# Patient Record
Sex: Male | Born: 1950
Health system: Southern US, Community
[De-identification: ages and names within clinical notes are randomized; demographics above are authoritative.]

## PROBLEM LIST (undated history)

## (undated) DIAGNOSIS — C801 Malignant (primary) neoplasm, unspecified: Secondary | ICD-10-CM

## (undated) DIAGNOSIS — J310 Chronic rhinitis: Secondary | ICD-10-CM

## (undated) DIAGNOSIS — H269 Unspecified cataract: Secondary | ICD-10-CM

## (undated) DIAGNOSIS — F1010 Alcohol abuse, uncomplicated: Secondary | ICD-10-CM

## (undated) DIAGNOSIS — Z860100 Personal history of colon polyps, unspecified: Secondary | ICD-10-CM

## (undated) DIAGNOSIS — E785 Hyperlipidemia, unspecified: Secondary | ICD-10-CM

## (undated) DIAGNOSIS — N189 Chronic kidney disease, unspecified: Secondary | ICD-10-CM

## (undated) DIAGNOSIS — Z87442 Personal history of urinary calculi: Secondary | ICD-10-CM

## (undated) DIAGNOSIS — K219 Gastro-esophageal reflux disease without esophagitis: Secondary | ICD-10-CM

## (undated) DIAGNOSIS — E119 Type 2 diabetes mellitus without complications: Secondary | ICD-10-CM

## (undated) DIAGNOSIS — T7840XA Allergy, unspecified, initial encounter: Secondary | ICD-10-CM

## (undated) HISTORY — DX: Chronic kidney disease, unspecified: N18.9

## (undated) HISTORY — DX: Alcohol abuse, uncomplicated: F10.10

## (undated) HISTORY — DX: Personal history of colon polyps, unspecified: Z86.0100

## (undated) HISTORY — DX: Allergy, unspecified, initial encounter: T78.40XA

## (undated) HISTORY — PX: CATARACT EXTRACTION: SUR2

## (undated) HISTORY — DX: Chronic rhinitis: J31.0

## (undated) HISTORY — DX: Personal history of colonic polyps: Z86.010

## (undated) HISTORY — DX: Hyperlipidemia, unspecified: E78.5

## (undated) HISTORY — DX: Unspecified cataract: H26.9

---
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
## 1976-12-18 HISTORY — PX: OTHER SURGICAL HISTORY: SHX169

## 2002-12-18 HISTORY — PX: WISDOM TOOTH EXTRACTION: SHX21

## 2004-10-31 ENCOUNTER — Ambulatory Visit: Payer: Self-pay | Admitting: Internal Medicine

## 2004-11-02 ENCOUNTER — Ambulatory Visit: Payer: Self-pay | Admitting: Internal Medicine

## 2004-11-07 ENCOUNTER — Ambulatory Visit: Payer: Self-pay | Admitting: Internal Medicine

## 2005-06-09 ENCOUNTER — Ambulatory Visit: Payer: Self-pay | Admitting: Internal Medicine

## 2005-07-28 ENCOUNTER — Ambulatory Visit: Payer: Self-pay | Admitting: Internal Medicine

## 2005-08-22 ENCOUNTER — Ambulatory Visit: Payer: Self-pay | Admitting: Internal Medicine

## 2005-08-22 ENCOUNTER — Encounter (INDEPENDENT_AMBULATORY_CARE_PROVIDER_SITE_OTHER): Payer: Self-pay | Admitting: Specialist

## 2005-11-15 ENCOUNTER — Ambulatory Visit: Payer: Self-pay | Admitting: Internal Medicine

## 2005-11-22 ENCOUNTER — Ambulatory Visit: Payer: Self-pay | Admitting: Internal Medicine

## 2005-11-29 ENCOUNTER — Ambulatory Visit: Payer: Self-pay | Admitting: Internal Medicine

## 2006-01-12 ENCOUNTER — Ambulatory Visit: Payer: Self-pay | Admitting: Internal Medicine

## 2006-03-01 ENCOUNTER — Ambulatory Visit: Payer: Self-pay | Admitting: Internal Medicine

## 2006-10-19 ENCOUNTER — Ambulatory Visit: Payer: Self-pay | Admitting: Internal Medicine

## 2006-12-21 ENCOUNTER — Ambulatory Visit: Payer: Self-pay | Admitting: Internal Medicine

## 2006-12-21 LAB — CONVERTED CEMR LAB
Albumin: 3.8 g/dL (ref 3.5–5.2)
Alkaline Phosphatase: 48 units/L (ref 39–117)
BUN: 18 mg/dL (ref 6–23)
Basophils Absolute: 0 10*3/uL (ref 0.0–0.1)
CO2: 29 meq/L (ref 19–32)
Calcium: 9 mg/dL (ref 8.4–10.5)
Chol/HDL Ratio, serum: 2.8
Creatinine, Ser: 0.9 mg/dL (ref 0.4–1.5)
Glomerular Filtration Rate, Af Am: 114 mL/min/{1.73_m2}
Glucose, Bld: 112 mg/dL — ABNORMAL HIGH (ref 70–99)
Hemoglobin, Urine: NEGATIVE
Hemoglobin: 14.5 g/dL (ref 13.0–17.0)
Ketones, ur: NEGATIVE mg/dL
LDL Cholesterol: 66 mg/dL (ref 0–99)
Lymphocytes Relative: 43.5 % (ref 12.0–46.0)
MCHC: 34.4 g/dL (ref 30.0–36.0)
MCV: 92.2 fL (ref 78.0–100.0)
Monocytes Absolute: 0.6 10*3/uL (ref 0.2–0.7)
Monocytes Relative: 9.9 % (ref 3.0–11.0)
Neutro Abs: 2.4 10*3/uL (ref 1.4–7.7)
Neutrophils Relative %: 38.7 % — ABNORMAL LOW (ref 43.0–77.0)
Nitrite: NEGATIVE
Platelets: 261 10*3/uL (ref 150–400)
TSH: 1.92 microintl units/mL (ref 0.35–5.50)
Total Protein: 6.3 g/dL (ref 6.0–8.3)
Urine Glucose: NEGATIVE mg/dL
Urobilinogen, UA: 0.2 (ref 0.0–1.0)
VLDL: 10 mg/dL (ref 0–40)
pH: 5.5 (ref 5.0–8.0)

## 2006-12-28 ENCOUNTER — Ambulatory Visit: Payer: Self-pay | Admitting: Internal Medicine

## 2007-09-16 ENCOUNTER — Encounter: Payer: Self-pay | Admitting: *Deleted

## 2007-09-16 DIAGNOSIS — J310 Chronic rhinitis: Secondary | ICD-10-CM | POA: Insufficient documentation

## 2007-09-16 DIAGNOSIS — E785 Hyperlipidemia, unspecified: Secondary | ICD-10-CM | POA: Insufficient documentation

## 2007-09-18 ENCOUNTER — Ambulatory Visit: Payer: Self-pay | Admitting: Internal Medicine

## 2008-02-18 ENCOUNTER — Telehealth: Payer: Self-pay | Admitting: Internal Medicine

## 2008-04-16 ENCOUNTER — Telehealth: Payer: Self-pay | Admitting: Internal Medicine

## 2008-04-16 ENCOUNTER — Ambulatory Visit: Payer: Self-pay | Admitting: Internal Medicine

## 2008-04-16 LAB — CONVERTED CEMR LAB
ALT: 45 units/L (ref 0–53)
AST: 34 units/L (ref 0–37)
Alkaline Phosphatase: 48 units/L (ref 39–117)
BUN: 14 mg/dL (ref 6–23)
Basophils Absolute: 0 10*3/uL (ref 0.0–0.1)
CO2: 31 meq/L (ref 19–32)
Chloride: 106 meq/L (ref 96–112)
Cholesterol: 159 mg/dL (ref 0–200)
Eosinophils Absolute: 0.3 10*3/uL (ref 0.0–0.7)
Eosinophils Relative: 4.5 % (ref 0.0–5.0)
GFR calc non Af Amer: 107 mL/min
Hemoglobin, Urine: NEGATIVE
LDL Cholesterol: 102 mg/dL — ABNORMAL HIGH (ref 0–99)
Leukocytes, UA: NEGATIVE
Lymphocytes Relative: 41.8 % (ref 12.0–46.0)
MCV: 92.1 fL (ref 78.0–100.0)
Neutrophils Relative %: 42.9 % — ABNORMAL LOW (ref 43.0–77.0)
Platelets: 223 10*3/uL (ref 150–400)
Potassium: 4.3 meq/L (ref 3.5–5.1)
RBC: 4.75 M/uL (ref 4.22–5.81)
Specific Gravity, Urine: <=1.005 (ref 1.000–1.03)
Total Bilirubin: 1 mg/dL (ref 0.3–1.2)
Total CHOL/HDL Ratio: 3.6
Urine Glucose: NEGATIVE mg/dL
Urobilinogen, UA: 0.2 (ref 0.0–1.0)
VLDL: 13 mg/dL (ref 0–40)
WBC: 6.7 10*3/uL (ref 4.5–10.5)

## 2008-04-28 ENCOUNTER — Ambulatory Visit: Payer: Self-pay | Admitting: Internal Medicine

## 2008-04-28 DIAGNOSIS — F1021 Alcohol dependence, in remission: Secondary | ICD-10-CM | POA: Insufficient documentation

## 2008-05-15 ENCOUNTER — Ambulatory Visit: Payer: Self-pay | Admitting: Sports Medicine

## 2008-05-15 DIAGNOSIS — M722 Plantar fascial fibromatosis: Secondary | ICD-10-CM | POA: Insufficient documentation

## 2008-05-15 DIAGNOSIS — Q667 Congenital pes cavus, unspecified foot: Secondary | ICD-10-CM | POA: Insufficient documentation

## 2008-05-15 DIAGNOSIS — M629 Disorder of muscle, unspecified: Secondary | ICD-10-CM | POA: Insufficient documentation

## 2008-05-17 DIAGNOSIS — Z860100 Personal history of colon polyps, unspecified: Secondary | ICD-10-CM | POA: Insufficient documentation

## 2008-09-01 ENCOUNTER — Telehealth: Payer: Self-pay | Admitting: Internal Medicine

## 2008-10-09 ENCOUNTER — Ambulatory Visit: Payer: Self-pay | Admitting: Internal Medicine

## 2008-11-02 ENCOUNTER — Ambulatory Visit: Payer: Self-pay | Admitting: Internal Medicine

## 2008-11-16 ENCOUNTER — Encounter: Payer: Self-pay | Admitting: Internal Medicine

## 2008-11-16 ENCOUNTER — Ambulatory Visit: Payer: Self-pay | Admitting: Internal Medicine

## 2008-11-16 LAB — HM COLONOSCOPY

## 2008-11-17 ENCOUNTER — Encounter: Payer: Self-pay | Admitting: Internal Medicine

## 2008-11-23 ENCOUNTER — Telehealth: Payer: Self-pay | Admitting: Internal Medicine

## 2009-06-03 ENCOUNTER — Ambulatory Visit: Payer: Self-pay | Admitting: Internal Medicine

## 2009-06-03 LAB — CONVERTED CEMR LAB
Alkaline Phosphatase: 61 units/L (ref 39–117)
Basophils Relative: 0 % (ref 0.0–3.0)
Bilirubin, Direct: 0.2 mg/dL (ref 0.0–0.3)
Calcium: 8.9 mg/dL (ref 8.4–10.5)
Creatinine, Ser: 0.8 mg/dL (ref 0.4–1.5)
Eosinophils Absolute: 0.4 10*3/uL (ref 0.0–0.7)
Eosinophils Relative: 5.3 % — ABNORMAL HIGH (ref 0.0–5.0)
GFR calc non Af Amer: 106.28 mL/min (ref >60)
HDL: 49.6 mg/dL (ref >39.00)
Hemoglobin, Urine: NEGATIVE
LDL Cholesterol: 82 mg/dL (ref 0–99)
Lymphocytes Relative: 36.9 % (ref 12.0–46.0)
MCHC: 34.9 g/dL (ref 30.0–36.0)
Monocytes Relative: 10.1 % (ref 3.0–12.0)
Neutrophils Relative %: 47.7 % (ref 43.0–77.0)
PSA: 0.62 ng/mL (ref 0.10–4.00)
RBC: 4.48 M/uL (ref 4.22–5.81)
Total CHOL/HDL Ratio: 3
Total Protein, Urine: NEGATIVE mg/dL
Total Protein: 6.4 g/dL (ref 6.0–8.3)
Triglycerides: 86 mg/dL (ref 0.0–149.0)
Urine Glucose: NEGATIVE mg/dL
WBC: 7 10*3/uL (ref 4.5–10.5)

## 2009-06-08 ENCOUNTER — Ambulatory Visit: Payer: Self-pay | Admitting: Internal Medicine

## 2009-10-12 ENCOUNTER — Ambulatory Visit: Payer: Self-pay | Admitting: Internal Medicine

## 2010-06-21 ENCOUNTER — Ambulatory Visit: Payer: Self-pay | Admitting: Internal Medicine

## 2010-06-22 LAB — CONVERTED CEMR LAB
Albumin: 4.1 g/dL (ref 3.5–5.2)
CO2: 28 meq/L (ref 19–32)
Chloride: 109 meq/L (ref 96–112)
Creatinine, Ser: 0.8 mg/dL (ref 0.4–1.5)
Eosinophils Relative: 7.9 % — ABNORMAL HIGH (ref 0.0–5.0)
HCT: 41.5 % (ref 39.0–52.0)
HDL: 50.9 mg/dL (ref >39.00)
Hemoglobin: 14.5 g/dL (ref 13.0–17.0)
LDL Cholesterol: 80 mg/dL (ref 0–99)
Lymphs Abs: 3.2 10*3/uL (ref 0.7–4.0)
Monocytes Relative: 9.4 % (ref 3.0–12.0)
Neutro Abs: 2.9 10*3/uL (ref 1.4–7.7)
Nitrite: NEGATIVE
RBC: 4.5 M/uL (ref 4.22–5.81)
Sodium: 141 meq/L (ref 135–145)
Specific Gravity, Urine: 1.02 (ref 1.000–1.030)
TSH: 2.39 microintl units/mL (ref 0.35–5.50)
Total CHOL/HDL Ratio: 3
Total Protein, Urine: NEGATIVE mg/dL
Triglycerides: 105 mg/dL (ref 0.0–149.0)
Urine Glucose: NEGATIVE mg/dL
Urobilinogen, UA: 0.2 (ref 0.0–1.0)
VLDL: 21 mg/dL (ref 0.0–40.0)
WBC: 7.4 10*3/uL (ref 4.5–10.5)

## 2010-06-29 ENCOUNTER — Ambulatory Visit: Payer: Self-pay | Admitting: Internal Medicine

## 2010-10-10 ENCOUNTER — Ambulatory Visit: Payer: Self-pay | Admitting: Internal Medicine

## 2011-01-19 NOTE — Assessment & Plan Note (Signed)
Summary: FLU SHOT/NWS   Nurse Visit   Allergies: 1)  ! Sudafed  Orders Added: 1)  Admin 1st Vaccine [90471] 2)  Flu Vaccine 25yrs + [16109]      Flu Vaccine Consent Questions     Do you have a history of severe allergic reactions to this vaccine? no    Any prior history of allergic reactions to egg and/or gelatin? no    Do you have a sensitivity to the preservative Thimersol? no    Do you have a past history of Guillan-Barre Syndrome? no    Do you currently have an acute febrile illness? no    Have you ever had a severe reaction to latex? no    Vaccine information given and explained to patient? yes    Are you currently pregnant? no    Lot Number:[id number]   Exp Date:06/17/2011   Site Given  Left Deltoid IM1

## 2011-01-19 NOTE — Assessment & Plan Note (Signed)
Summary: CPX/ NWS   Vital Signs:  Patient profile:   60 year old male Height:      69 inches Weight:      206 pounds BMI:     30.53 O2 Sat:      98 % on Room air Temp:     96.8 degrees F oral Pulse rate:   74 / minute BP sitting:   132 / 88  (left arm) Cuff size:   regular  Vitals Entered By: Bill Salinas CMA (June 29, 2010 10:54 AM)  O2 Flow:  Room air CC: cpx/ ab  Vision Screening:      Vision Comments: Last eye exam was Jan 2011 with normal exam   Primary Care Provider:  Norins  CC:  cpx/ ab.  History of Present Illness: Patient presents for routine follow-up. He has been healthy. 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 He reports that his minor leg pains have subsided. In general he is feeling well and healthy.  Preventive Screening-Counseling & Management  Alcohol-Tobacco     Alcohol drinks/day: 0     Smoking Status: never  Current Medications (verified): 1)  Multivitamins   Tabs (Multiple Vitamin) .... Once Daily 2)  Crestor 10 Mg  Tabs (Rosuvastatin Calcium) .... Once Daily Physical Is Due No Addtiona Refills Until Appt 3)  Flaxseed   Misc (Flaxseed) .... Once Daily 4)  Adult Aspirin Ec Low Strength 81 Mg  Tbec (Aspirin) .... Once Daily 5)  B-50 Balanced   Tabs (B Complex-Folic Acid) .... Once Daily 6)  Vitamin C 500 Mg  Chew (Ascorbic Acid) .... 2 Once Daily 7)  Allergy Injection .... Weekly 8)  Ra Biotin 2500 Mcg Caps (Biotin) .Marland Kitchen.. 1 Once Daily  Allergies (verified): 1)  ! Sudafed  Past History:  Past Medical History: Last updated: 2008/06/03 RHINITIS (ICD-472.0), on allergy shots HYPERLIPIDEMIA (ICD-272.4) Asthma - childhood EtOH abuse, h/o Colonic polyps, hx of  Past Surgical History: Last updated: 09/16/2007 Tonsillectomy Hernia w/hydrocele Left shoulder pin setting  Family History: Last updated: June 03, 2008 father - deceased: HTN, conduction defect with Pacemaker mother - '25; h/o breast cancer,  trigeminal neuralgia s/p gamma knife, CAD/CHF Neg- colon cancer, prostate cancer, DM  Social History: Last updated: 04/28/2008 College graduate married '91 no children work: Buyer, 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    Physical Exam  General:  Well-developed,well-nourished,in no acute distress; alert,appropriate and cooperative throughout examination Head:  Normocephalic and atraumatic without obvious abnormalities. No apparent alopecia or balding. Eyes:  No corneal or conjunctival inflammation noted. EOMI. Perrla. Funduscopic exam benign, without hemorrhages, exudates or papilledema. Vision grossly normal. Ears:  External ear exam shows no significant lesions or deformities.  Otoscopic examination reveals clear canals, tympanic membranes are intact bilaterally without bulging, retraction, inflammation or discharge. Hearing is grossly normal bilaterally. Nose:  no external deformity and no external erythema.   Mouth:  Oral mucosa and oropharynx without lesions or exudates.  Teeth in good repair. Neck:  supple, no thyromegaly, and no carotid bruits.   Chest Wall:  No deformities, masses, tenderness or gynecomastia noted. Lungs:  Normal respiratory effort, chest expands symmetrically. Lungs are clear to auscultation, no crackles or wheezes. Heart:  Normal rate and regular rhythm. S1 and S2 normal without gallop, murmur, click, rub or other extra sounds. Abdomen:  soft, non-tender, normal bowel sounds, no guarding, and no  hepatomegaly.   Prostate:  deferred to normal PSA Msk:  normal ROM, no joint tenderness, no joint swelling, no redness over joints, and no joint instability.   Pulses:  2+ radial and DP pulses Extremities:  No clubbing, cyanosis, edema, or deformity noted with normal full range of motion of all joints.   Neurologic:  alert & oriented X3, cranial nerves II-XII intact, strength normal in all extremities, gait normal, and DTRs symmetrical and normal.   Skin:  turgor normal, color normal, and no suspicious lesions.   Cervical Nodes:  No lymphadenopathy noted Psych:  Oriented X3, memory intact for recent and remote, normally interactive, good eye contact, and not anxious appearing.     Impression & Recommendations:  Problem # 1:  HYPERLIPIDEMIA (ICD-272.4) Excellent control on crestor with normal liver functions.  Plan - continue present regimen  His updated medication list for this problem includes:    Crestor 10 Mg Tabs (Rosuvastatin calcium) ..... Once daily physical is due no addtiona refills until appt  Problem # 2:  Preventive Health Care (ICD-V70.0) No interval medical problems. Normal physical exam. Lab results are normal. Emotionally doing very well - adjusting to his loss. Up to date with colorectal cancer screening with last study in '09. Immunizations are up to date.   In summary - a very nice man who is medically stable and continues to invest time and energy into his personal health. He will return as needed or 1 year.   Complete Medication List: 1)  Multivitamins Tabs (Multiple vitamin) .... Once daily 2)  Crestor 10 Mg Tabs (Rosuvastatin calcium) .... Once daily physical is due no addtiona refills until appt 3)  Flaxseed Misc (Flaxseed) .... Once daily 4)  Adult Aspirin Ec Low Strength 81 Mg Tbec (Aspirin) .... Once daily 5)  B-50 Balanced Tabs (B complex-folic acid) .... Once daily 6)  Vitamin C 500 Mg Chew (Ascorbic acid) .... 2 once daily 7)  Allergy Injection  ....  Weekly 8)  Ra Biotin 2500 Mcg Caps (Biotin) .Marland Kitchen.. 1 once daily   Patient: [facility] Note: All result statuses are Final unless otherwise noted.  Tests: (1) BMP (METABOL)   Sodium                    141 mEq/L                   135-145   Potassium            [H]  5.3 mEq/L                   3.5-5.1   Chloride                  109 mEq/L                   96-112   Carbon Dioxide            28 mEq/L                    19-32   Glucose              [H]  110 mg/dL  70-99   BUN                       21 mg/dL                    7-56   Creatinine                0.8 mg/dL                   4.3-3.2   Calcium                   8.9 mg/dL                   9.5-18.8   GFR                       107.42 mL/min               >60  Tests: (2) Lipid Panel (LIPID)   Cholesterol               152 mg/dL                   4-166     ATP III Classification            Desirable:  < 200 mg/dL                    Borderline High:  200 - 239 mg/dL               High:  > = 240 mg/dL   Triglycerides             105.0 mg/dL                 0.6-301.6     Normal:  <150 mg/dL     Borderline High:  010 - 199 mg/dL   HDL                       93.23 mg/dL                 >55.73   VLDL Cholesterol          21.0 mg/dL                  2.2-02.5   LDL Cholesterol           80 mg/dL                    4-27  CHO/HDL Ratio:  CHD Risk                             3                    Men          Women     1/2 Average Risk     3.4          3.3     Average Risk          5.0          4.4     2X Average Risk          9.6          7.1     3X Average Risk  15.0          11.0                           Tests: (3) Hepatic/Liver Function Panel (HEPATIC)   Total Bilirubin           0.8 mg/dL                   0.4-5.4   Direct Bilirubin          0.2 mg/dL                   0.9-8.1   Alkaline Phosphatase      57 U/L                      39-117   AST                       26 U/L                      0-37   ALT                        29 U/L                      0-53   Total Protein             6.5 g/dL                    1.9-1.4   Albumin                   4.1 g/dL                    7.8-2.9  Tests: (4) CBC Platelet w/Diff (CBCD)   White Cell Count          7.4 K/uL                    4.5-10.5   Red Cell Count            4.50 Mil/uL                 4.22-5.81   Hemoglobin                14.5 g/dL                   56.2-13.0   Hematocrit                41.5 %                      39.0-52.0   MCV                       92.2 fl                     78.0-100.0   MCHC                      35.0 g/dL                   86.5-78.4   RDW  11.9 %                      11.5-14.6   Platelet Count            230.0 K/uL                  150.0-400.0   Neutrophil %         [L]  38.9 %                      43.0-77.0   Lymphocyte %              43.1 %                      12.0-46.0   Monocyte %                9.4 %                       3.0-12.0   Eosinophils%         [H]  7.9 %                       0.0-5.0   Basophils %               0.7 %                       0.0-3.0   Neutrophill Absolute      2.9 K/uL                    1.4-7.7   Lymphocyte Absolute       3.2 K/uL                    0.7-4.0   Monocyte Absolute         0.7 K/uL                    0.1-1.0  Eosinophils, Absolute                             0.6 K/uL                    0.0-0.7   Basophils Absolute        0.1 K/uL                    0.0-0.1  Tests: (5) TSH (TSH)   FastTSH                   2.39 uIU/mL                 0.35-5.50  Tests: (6) Prostate Specific Antigen (PSA)   PSA-Hyb                   0.70 ng/mL                  0.10-4.00  Tests: (7) UDip Only (UDIP)   Color                     YELLOW       RANGE:  Yellow;Lt. Yellow   Clarity  CLEAR                       Clear   Specific Gravity          1.020                       1.000 - 1.030   Urine Ph                  6.5                         5.0-8.0    Protein                   NEGATIVE                    Negative   Urine Glucose             NEGATIVE                    Negative   Ketones                   NEGATIVE                    Negative   Urine Bilirubin           NEGATIVE                    Negative   Blood                     NEGATIVE                    Negative   Urobilinogen              0.2                         0.0 - 1.0   Leukocyte Esterace        NEGATIVE                    Negative   Nitrite                   NEGATIVE                    Negative

## 2011-05-05 NOTE — Assessment & Plan Note (Signed)
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  He was last seen in the office  January 12, 2006 for cerumen impaction.  In the interval the patient  reports that he has been feeling very well and doing well.  He has now  abstemious 18 months.  He seems to have a strong recovery.  Of note the  patient now is reconciled with his wife.  He reports that their  relationship is stronger than ever.   PAST MEDICAL HISTORY:  Well-documented in my note of November 29, 2005  without changes or corrections.   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   CURRENT MEDICATIONS:  1. Allergy shots weekly.  2. Multivitamin.  3. Crestor 10 mg daily.  4. __________  oral daily.  5. Aspirin 81 mg daily.  6. B50 and multiple vitamins, Vitamin C.   REVIEW OF SYSTEMS:  The patient has had no fevers, sweats, chills, or  other constitutional symptoms.  Last eye exam was December 28, 2006 and  it was fine.  DENTAL:  The patient now has his braces off.  He is  wearing a retainer.  He has no other active dental problems.  No  cardiovascular or respiratory complaints.  He has rare heartburn.  He  has nocturia x2-3 and does complain of symptoms of prostatism included  delay in stream, slow stream, postvoid dribble.  MUSCULOSKELETAL:  Significant for ongoing plantar fasciitis.  The patient does wear  orthotics and stretches.  This is a recurrent problem if he wears  inappropriate footwear when running.  No endocrinologic or neurologic  problems.   PHYSICAL EXAMINATION:  VITAL SIGNS:  Temperature is 97.6, blood pressure  131/79, pulse 71, weight 196.  GENERAL:  Well-nourished, well-developed gentleman, looks his stated  age.  No acute distress.  HEENT:  Normocephalic, atraumatic.  EACs and TMs were unremarkable.  Oropharynx was native dentition in excellent repair.  No buccal or  palatal lesions were noted.  Posterior pharynx was clear.  Conjunctivae  and sclerae were clear.  PERRLA.  EOMI.  Funduscopic exam deferred to  ophthalmology.  NECK:  Supple without thyromegaly.  No lymphadenopathy was noted in the  cervical or supraclavicular regions.  CHEST:  No CVA tenderness.  LUNGS:  Clear to auscultation and percussion.  CARDIOVASCULAR:  2+ radial pulses.  No JVD or carotid bruits.  He had a  quiet precordium with regular rate and rhythm without murmurs, rubs, or  gallops.  ABDOMEN:  Soft.  No guarding or rebound.  No organosplenomegaly was  noted.  GENITALIA:  Normal male phallus.  Bilaterally descended testicles  without masses.  There was a varicocele on the left.  RECTAL:  Revealed normal sphincter tone.  Prostate was smooth.  Normal  size and contour without nodules, but was not particularly enlarged.  EXTREMITIES:  Without clubbing, cyanosis, edema, or deformity.  NEUROLOGIC:  Nonfocal.  SKIN:  Clear.   CHART REVIEW:  The patient's last colonoscopy dates from August 22, 2005 and the patient would be a candidate for follow-up in 2009 due to  colon polyps with final path report being benign lymphoid aggregate.   The patient has been seen in follow-up by [facility]  for his right plantar fasciitis.   DATABASE:  Hemoglobin was 14.5 g, white count was 6300 with a normal  differential.  Chemistries were unremarkable with a serum glucose of  112.  Electrolytes were normal.  Liver function was normal.  Kidney  function with a creatinine of 0.9 and a GFR of 94.  TSH was normal at  1.92.  PSA was normal at 0.67.  Urinalysis was negative.  Cholesterol  was 119, triglycerides were  50, HDL was 43, LDL was 66.   ASSESSMENT/PLAN:  1. Allergic rhinitis.  The patient is currently stable and continues      with allergy shots.  2. Hyperlipidemia.  Patient with an excellent response to Crestor,      bringing his LDL cholesterol down from a pretreatment level of      greater than 180 down to 66.  Plan:  The patient is to continue his      present medications.  3. Orthopedic.  The patient has done well with left shoulder surgery.      The patient does have ongoing plantar fasciitis which is      intermittent.  At this point would not recommend any further      medical intervention except for stretching exercises and good      footwear.  4. Health maintenance.  The patient is current and up to date and      would be a candidate for follow-up colonoscopy in 2009 as noted.   The patient was given positive reinforcement for his recovery and period  of abstinence.  Would encourage him to continue the same.   The patient is asked to return to see me on a p.r.n. basis or in one  year.     Rosalyn Gess Norins, MD  Electronically Signed   MEN/MedQ  DD: 12/29/2006  DT: 12/30/2006  Job #: [id number]   cc:   Darrin Luis, Mr.

## 2011-06-29 ENCOUNTER — Other Ambulatory Visit: Payer: Self-pay

## 2011-06-29 ENCOUNTER — Other Ambulatory Visit: Payer: Self-pay | Admitting: Internal Medicine

## 2011-06-29 DIAGNOSIS — Z0000 Encounter for general adult medical examination without abnormal findings: Secondary | ICD-10-CM

## 2011-06-29 DIAGNOSIS — Z0389 Encounter for observation for other suspected diseases and conditions ruled out: Secondary | ICD-10-CM

## 2011-06-30 ENCOUNTER — Other Ambulatory Visit (INDEPENDENT_AMBULATORY_CARE_PROVIDER_SITE_OTHER): Payer: BC Managed Care – PPO

## 2011-06-30 DIAGNOSIS — Z0000 Encounter for general adult medical examination without abnormal findings: Secondary | ICD-10-CM

## 2011-06-30 DIAGNOSIS — Z0389 Encounter for observation for other suspected diseases and conditions ruled out: Secondary | ICD-10-CM

## 2011-06-30 LAB — LIPID PANEL
HDL: 52.2 mg/dL (ref >39.00)
LDL Cholesterol: 103 mg/dL — ABNORMAL HIGH (ref 0–99)
Total CHOL/HDL Ratio: 3
Triglycerides: 44 mg/dL (ref 0.0–149.0)

## 2011-06-30 LAB — CBC WITH DIFFERENTIAL/PLATELET
Basophils Relative: 0.5 % (ref 0.0–3.0)
Eosinophils Absolute: 0.2 10*3/uL (ref 0.0–0.7)
MCHC: 34.6 g/dL (ref 30.0–36.0)
MCV: 93 fl (ref 78.0–100.0)
Monocytes Absolute: 0.8 10*3/uL (ref 0.1–1.0)
Neutrophils Relative %: 41.7 % — ABNORMAL LOW (ref 43.0–77.0)
Platelets: 230 10*3/uL (ref 150.0–400.0)
RBC: 4.82 Mil/uL (ref 4.22–5.81)

## 2011-06-30 LAB — BASIC METABOLIC PANEL
CO2: 28 mEq/L (ref 19–32)
Chloride: 106 mEq/L (ref 96–112)
Creatinine, Ser: 0.9 mg/dL (ref 0.4–1.5)
Glucose, Bld: 129 mg/dL — ABNORMAL HIGH (ref 70–99)
Sodium: 140 mEq/L (ref 135–145)

## 2011-06-30 LAB — URINALYSIS
Bilirubin Urine: NEGATIVE
Hgb urine dipstick: NEGATIVE
Ketones, ur: NEGATIVE
Leukocytes, UA: NEGATIVE
Urobilinogen, UA: 0.2 (ref 0.0–1.0)

## 2011-06-30 LAB — HEPATIC FUNCTION PANEL
Albumin: 4.8 g/dL (ref 3.5–5.2)
Total Protein: 7.2 g/dL (ref 6.0–8.3)

## 2011-06-30 LAB — TSH: TSH: 1.78 u[IU]/mL (ref 0.35–5.50)

## 2011-07-02 ENCOUNTER — Encounter: Payer: Self-pay | Admitting: Internal Medicine

## 2011-07-05 ENCOUNTER — Telehealth: Payer: Self-pay | Admitting: *Deleted

## 2011-07-05 NOTE — Telephone Encounter (Signed)
Pt's spouse called requesting copy of lab results be sent to pt

## 2011-07-06 ENCOUNTER — Encounter: Payer: Self-pay | Admitting: Internal Medicine

## 2011-07-06 NOTE — Telephone Encounter (Signed)
Pt's spouse informed 

## 2011-07-06 NOTE — Telephone Encounter (Signed)
loabs printed and ready to mail.

## 2011-09-06 ENCOUNTER — Other Ambulatory Visit: Payer: Self-pay | Admitting: Internal Medicine

## 2011-09-08 ENCOUNTER — Encounter: Payer: BC Managed Care – PPO | Admitting: Internal Medicine

## 2011-09-13 ENCOUNTER — Ambulatory Visit (INDEPENDENT_AMBULATORY_CARE_PROVIDER_SITE_OTHER): Payer: BC Managed Care – PPO | Admitting: Internal Medicine

## 2011-09-13 DIAGNOSIS — E785 Hyperlipidemia, unspecified: Secondary | ICD-10-CM

## 2011-09-13 DIAGNOSIS — Z0000 Encounter for general adult medical examination without abnormal findings: Secondary | ICD-10-CM

## 2011-09-13 DIAGNOSIS — F1021 Alcohol dependence, in remission: Secondary | ICD-10-CM

## 2011-09-14 ENCOUNTER — Encounter: Payer: Self-pay | Admitting: Internal Medicine

## 2011-09-14 DIAGNOSIS — Z0000 Encounter for general adult medical examination without abnormal findings: Secondary | ICD-10-CM | POA: Insufficient documentation

## 2011-09-14 NOTE — Assessment & Plan Note (Signed)
Doing very well - 6 years of abstention. Remains actively engaged in his recovery.

## 2011-09-14 NOTE — Assessment & Plan Note (Signed)
Interval medical history is benign. Physical exam is normal except for weight - BMI 29. Lab results are in normal limits. He is current with colorectal cancer screening with last study in '09. PSA is normal and stable over time. Immunizatios: current Tdap and will get influenza vaccine in October '12.   Weight management - discussed weight management: smart food choices; portion size control - hand as guide,for meals and snacks, "meal of 1,000 chews;  continue regular exercise. Target 190-200 lbs, goal is to loose 1 lb per month.  Elevated BP - probable "white coat" phenomena. He is to monitor BP at home at random times and report back in writing. Recommendations to be based on readings.  In summary - a delightful man who is medically stable and doing well. He will return in 1 year or prn.

## 2011-09-14 NOTE — Assessment & Plan Note (Signed)
Excellent control on present regimen with LDL better than goal of <130. He tolerates crestor well with no adverse effects.   Plan - continue present regimen

## 2011-09-14 NOTE — Progress Notes (Signed)
Subjective:    Patient ID: Brian Ayers, male    DOB: Dec 31, 1950, 60 y.o.   MRN: [id number]  HPI The patient is here for annual wellness examination and management of other chronic and acute problems. 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   The risk factors are reflected in the social history.  The roster of all physicians providing medical care to patient - is listed in the Snapshot section of the chart.  Activities of daily living:  The patient is 100% inedpendent in all ADLs: dressing, toileting, feeding as well as independent mobility  Home safety : The patient has smoke detectors in the home. They wear seatbelts.No firearms at home.  There is no risks for hepatitis, STDs or HIV. There is no   history of blood transfusion. They have no travel history to infectious disease endemic areas of the world.  The patient has seen their dentist in the last six month. They have seen their eye doctor in the last year. They deny any hearing difficulty and have not had audiologic testing in the last year.  They do not  have excessive sun exposure. Discussed the need for sun protection: hats, long sleeves and use of sunscreen if there is significant sun exposure.   Diet: the importance of a healthy diet is discussed. They do have a healthy diet.  The patient has a regular exercise program: gym work outs and running , 1-2 hours duration, 4-5 days per week.  The benefits of regular aerobic exercise were reenforced.  Depression screen: there are no signs or vegative symptoms of depression- irritability, change in appetite, anhedonia, sadness/tearfullness.  Cognitive assessment: the patient manages all their financial and personal affairs and is actively engaged. They could relate day,date,year and events. Continues to fly.  The following portions of the patient's history were reviewed and updated as appropriate: allergies, current medications, past family  history, past medical history,  past surgical history, past social history  and problem list.  Vision, hearing, body mass index were assessed and reviewed.   During the course of the visit the patient was educated and counseled about appropriate screening and preventive services including : fall prevention , diabetes screening, nutrition counseling, colorectal cancer screening, and recommended immunizations.  Past Medical History  Diagnosis Date  . Rhinitis     on allery shots  . Hyperlipidemia   . Asthma childhood  . Alcohol abuse     hixtory of  . Hx of colonic polyps    Past Surgical History  Procedure Date  . Tonsillectomy   . Hernia with hydrocele   . Left shoulder pin setting    Family History  Problem Relation Age of Onset  . Cancer Mother     breast (hx of)  . Other Mother     trigeminal neuralgia s/p gamma knife/ CHF  . Hypertension Father   . Diabetes Neg Hx    History   Social History  . Marital Status: Married    Spouse Name: N/A    Number of Children: N/A  . Years of Education: N/A   Occupational History  . 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 Smoking status: Not on file  . Smokeless tobacco: Not on file  . Alcohol Use: No     history of ETOH abuse  . Drug Use:   . Sexually Active: No     abstinent since 2006   Other  Topics Concern  . Not on file   Social History Narrative   College GraduateMarried [phone] children        Review of Systems Review of Systems  Constitutional:  Negative for fever, chills, activity change and unexpected weight change.  HEENT:  Negative for hearing loss, ear pain, congestion, neck stiffness and postnasal drip. Negative for sore throat or swallowing problems. Negative for dental complaints.   Eyes: Negative for vision loss or change in visual acuity.  Respiratory: Negative for chest tightness and wheezing.   Cardiovascular: Negative for chest pain and palpitation. No decreased exercise  tolerance Gastrointestinal: No change in bowel habit. No bloating or gas. No reflux or indigestion Genitourinary: Negative for urgency, frequency, flank pain and difficulty urinating.  Musculoskeletal: Negative for myalgias, back pain, arthralgias and gait problem.  Neurological: Negative for dizziness, tremors, weakness and headaches.  Hematological: Negative for adenopathy.  Psychiatric/Behavioral: Negative for behavioral problems and dysphoric mood.       Objective:   Physical Exam( with the assistance of Adalberto Ill, MSIV) Vital signs reviewed Gen'l: Well nourished well developed white male in no acute distress  HEENT: Head: Normocephalic and atraumatic. Right Ear: External ear normal. EAC/TM nl. Left Ear: External ear normal.  EAC/TM nl. Nose: Nose normal. Mouth/Throat: Oropharynx is clear and moist. Dentition - native, in good repair. No buccal or palatal lesions. Posterior pharynx clear. Eyes: Conjunctivae and sclera clear. EOM intact. Pupils are equal, round, and reactive to light. Right eye exhibits no discharge. Left eye exhibits no discharge. Neck: Normal range of motion. Neck supple. No JVD present. No tracheal deviation present. No thyromegaly present.  Cardiovascular: Normal rate, regular rhythm, no gallop, no friction rub, no murmur heard.      Quiet precordium. 2+ radial and DP pulses . No carotid bruits Pulmonary/Chest: Effort normal. No respiratory distress or increased WOB, no wheezes, no rales. No chest wall deformity or CVAT. Abdominal: Soft. Bowel sounds are normal in all quadrants. He exhibits no distension, no tenderness, no rebound or guarding, No heptosplenomegaly  Genitourinary: deferred   Musculoskeletal: Normal range of motion. He exhibits no edema and no tenderness.       Small and large joints without redness, synovial thickening or deformity. Full range of motion preserved about all small, median and large joints.  Lymphadenopathy:    He has no cervical or  supraclavicular adenopathy.  Neurological: He is alert and oriented to person, place, and time. CN II-XII intact. DTRs 2+ and symmetrical biceps, radial and patellar tendons. Cerebellar function normal with no tremor, rigidity, normal gait and station.  Skin: Skin is warm and dry. No rash noted. No erythema.  Psychiatric: He has a normal mood and affect. His behavior is normal. Thought content normal.   Lab Results  Component Value Date   WBC 8.0 06/30/2011   HGB 15.5 06/30/2011   HCT 44.9 06/30/2011   PLT 230.0 06/30/2011   CHOL 164 06/30/2011   TRIG 44.0 06/30/2011   HDL 52.20 06/30/2011   ALT 37 06/30/2011   AST 34 06/30/2011   NA 140 06/30/2011   K 4.4 06/30/2011   CL 106 06/30/2011   CREATININE 0.9 06/30/2011   BUN 21 06/30/2011   CO2 28 06/30/2011   TSH 1.78 06/30/2011   PSA 0.74 06/30/2011       Glucose                 129 Lab Results  Component Value Date   LDLCALC 103* 06/30/2011  Assessment & Plan:

## 2011-09-21 ENCOUNTER — Ambulatory Visit: Payer: BC Managed Care – PPO

## 2011-09-27 ENCOUNTER — Telehealth: Payer: Self-pay | Admitting: *Deleted

## 2011-09-27 NOTE — Telephone Encounter (Signed)
Got them. Majority are below 140 but if he is concerned and will we can start a low dose diuretic, e.g. HCTa 12.5 mg once a day. Continued monitoring without treatment is also an option

## 2011-09-27 NOTE — Telephone Encounter (Signed)
Left detailed VM for patient's wife

## 2011-09-27 NOTE — Telephone Encounter (Signed)
Wife called. Patient emailed list of BP reading to Dr Debby Bud. Did you receive this?

## 2011-09-28 ENCOUNTER — Ambulatory Visit (INDEPENDENT_AMBULATORY_CARE_PROVIDER_SITE_OTHER): Payer: BC Managed Care – PPO | Admitting: *Deleted

## 2011-09-28 DIAGNOSIS — Z23 Encounter for immunization: Secondary | ICD-10-CM

## 2012-07-15 ENCOUNTER — Other Ambulatory Visit: Payer: Self-pay | Admitting: Dermatology

## 2012-10-03 ENCOUNTER — Telehealth: Payer: Self-pay | Admitting: Internal Medicine

## 2012-10-03 NOTE — Telephone Encounter (Signed)
Caller: Stephanie/Spouse; Patient Name: Brian Ayers; PCP: Illene Regulus (Adults only); Best Callback Phone Number: [phone].  Pt.s spouse [name] calling; asking to be worked in next week for flu shot.  She is aware that the appointments are full next week, but is asking that pt. be worked in as he will be out of town the following week.  Pt. and spouse already have appointments scheduled for 10/15/12, but want this sooner.  [name] would like to be worked in as well.  PLEASE CONTACT PT. TO LET HER KNOW IF THIS IS A POSSIBLILITY.  STEPHANIE SEEMS TO BELIEVE THAT AMY COULD WORK PT. AND HERSELF IN.  THIS MESSAGE IS BEING SENT ON REQUEST OF THE PT.  CAN/db.

## 2012-10-03 NOTE — Telephone Encounter (Signed)
Okay for work in? 

## 2012-10-08 ENCOUNTER — Ambulatory Visit (INDEPENDENT_AMBULATORY_CARE_PROVIDER_SITE_OTHER): Payer: BC Managed Care – PPO

## 2012-10-08 DIAGNOSIS — Z23 Encounter for immunization: Secondary | ICD-10-CM

## 2012-10-15 ENCOUNTER — Ambulatory Visit: Payer: BC Managed Care – PPO

## 2012-10-30 ENCOUNTER — Encounter: Payer: BC Managed Care – PPO | Admitting: Internal Medicine

## 2012-10-30 ENCOUNTER — Telehealth: Payer: Self-pay | Admitting: Internal Medicine

## 2012-10-30 NOTE — Telephone Encounter (Signed)
Brian Ayers missed his appt today.  He thought it was Friday.  [name] called to see if he can be worked in [employer].  He only has off Wed, [place] and Friday off this week and no other week days off this month.

## 2012-10-30 NOTE — Telephone Encounter (Signed)
Ok Thurs PM

## 2012-10-31 ENCOUNTER — Ambulatory Visit (INDEPENDENT_AMBULATORY_CARE_PROVIDER_SITE_OTHER): Payer: BC Managed Care – PPO | Admitting: Internal Medicine

## 2012-10-31 ENCOUNTER — Encounter: Payer: Self-pay | Admitting: Internal Medicine

## 2012-10-31 ENCOUNTER — Other Ambulatory Visit (INDEPENDENT_AMBULATORY_CARE_PROVIDER_SITE_OTHER): Payer: BC Managed Care – PPO

## 2012-10-31 VITALS — BP 142/88 | HR 65 | Temp 98.6°F | Resp 12 | Ht 70.0 in | Wt 206.1 lb

## 2012-10-31 DIAGNOSIS — Z0000 Encounter for general adult medical examination without abnormal findings: Secondary | ICD-10-CM

## 2012-10-31 DIAGNOSIS — R030 Elevated blood-pressure reading, without diagnosis of hypertension: Secondary | ICD-10-CM

## 2012-10-31 DIAGNOSIS — E785 Hyperlipidemia, unspecified: Secondary | ICD-10-CM

## 2012-10-31 LAB — LIPID PANEL
HDL: 56.3 mg/dL (ref >39.00)
Total CHOL/HDL Ratio: 3
VLDL: 36.4 mg/dL (ref 0.0–40.0)

## 2012-10-31 LAB — HEPATIC FUNCTION PANEL
ALT: 36 U/L (ref 0–53)
AST: 30 U/L (ref 0–37)
Albumin: 4.8 g/dL (ref 3.5–5.2)
Total Bilirubin: 1.1 mg/dL (ref 0.3–1.2)

## 2012-10-31 LAB — COMPREHENSIVE METABOLIC PANEL
ALT: 36 U/L (ref 0–53)
Albumin: 4.8 g/dL (ref 3.5–5.2)
Alkaline Phosphatase: 61 U/L (ref 39–117)
CO2: 28 mEq/L (ref 19–32)
Glucose, Bld: 81 mg/dL (ref 70–99)
Potassium: 4.3 mEq/L (ref 3.5–5.1)
Sodium: 140 mEq/L (ref 135–145)
Total Bilirubin: 1.1 mg/dL (ref 0.3–1.2)
Total Protein: 7.4 g/dL (ref 6.0–8.3)

## 2012-10-31 NOTE — Patient Instructions (Addendum)
You look good and fit. Your exam is normal. For routine labs today.  Blood pressure: you are in the grey zone - majority of readings are below 140. We consider prehypertension as a condition we consider treating. It is dependent on the whole person and their overall risk. With your overall health being good it is very reasonable to not start medication at this time. Please monitor your blood pressure 3 times a week for a month or so. If more than 50% of the time the  BP is 140 or higher we will need to treat. If it is less than 140 most of the time we can watch and wait.   Listen to your knees and legs. You can get great aerobic training without high impact activity.  Full report to follow.

## 2012-10-31 NOTE — Telephone Encounter (Signed)
Spoke with wife and scheduled for this afternoon.

## 2012-11-01 ENCOUNTER — Encounter: Payer: Self-pay | Admitting: Internal Medicine

## 2012-11-01 DIAGNOSIS — R030 Elevated blood-pressure reading, without diagnosis of hypertension: Secondary | ICD-10-CM | POA: Insufficient documentation

## 2012-11-01 NOTE — Assessment & Plan Note (Signed)
INterval history is unremarkable with no major illness, surgery or injury. Physical exam is normal. Lab results are excellent except for mildly elevated triglycerides - very amenable to diet and exercise. He is current with colorectal cancer screening with next study due in 2019. Discussed pros and cons of prostate cancer screening (USPHCTF recommendations reviewed and ACU April '13 recommendations) and he defers evaluation at this time with a normal PSA in 2012. Immunizations are up to date - due after June '14 for pneumonia and shingles vaccines.  In summary - a very nice man who is medically stable and doing well. He will return as needed or in 1 year.

## 2012-11-01 NOTE — Assessment & Plan Note (Signed)
Lab reveals excellent control except for mild elevation in triglycerides. Liver functions are normal.  Plan  Continue present medication

## 2012-11-01 NOTE — Progress Notes (Signed)
Subjective:    Patient ID: Brian Ayers, male    DOB: 17-Apr-1951, 61 y.o.   MRN: [id number]  HPI Mr. Artis presents for general medical/wellness exam. He has been doing very well and feels healthy. He has lost 10 lbs through portion size control and regular exercise. He has had no major medical illness, surgery or injury in the interval. He has had normal flight physicals, including normal EKG which he brought and has been reviewed. He is current with his dentist and ophthalmologist. He continues to exercise, although he has switched from running to walking on a trainer due to some leg pain. He brought with him a record of home BP readings: majority of readings are with SBP below 140 although there are several readings above 140. He is on no medication at this time. No symptoms are reported.  Past Medical History  Diagnosis Date  . Rhinitis     on allery shots  . Hyperlipidemia   . Asthma childhood  . Alcohol abuse     hixtory of  . Hx of colonic polyps    Past Surgical History  Procedure Date  . Tonsillectomy   . Hernia with hydrocele   . Left shoulder pin setting    Family History  Problem Relation Age of Onset  . Cancer Mother     breast (hx of)  . Other Mother     trigeminal neuralgia s/p gamma knife/ CHF  . Hypertension Father   . Diabetes Neg Hx    History   Social History  . Marital Status: Married    Spouse Name: N/A    Number of Children: 0  . Years of Education: N/A   Occupational History  . 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 Smoking status: Former Games developer  . Smokeless tobacco: Former Neurosurgeon     Comment: only for a short period in the 1980's  . Alcohol Use: No     Comment: history of ETOH abuse  . Drug Use: No  . Sexually Active: No     Comment: abstinent since 2006   Other Topics Concern  . Not on file   Social History Higher education careers adviser. Married 1991- No children. Marriage is in good health. 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 Life is good.Regular exercise: daily/walk at gymCaffeine use: 2 cups of coffee daily    Current Outpatient Prescriptions on File Prior to Visit  Medication Sig Dispense Refill  . aspirin 81 MG EC tablet Take 81 mg by mouth daily.        . Biotin 2500 MCG CAPS Take by mouth.        . CRESTOR 10 MG tablet TAKE 1 TABLET BY MOUTH EVERY DAY NEEDS OFFICE VISIT  90 tablet  4  . DYMISTA 137-50 MCG/ACT SUSP       . multivitamin (THERAGRAN) per tablet Take 1 tablet by mouth daily.        . vitamin C (ASCORBIC ACID) 500 MG tablet Take 1,000 mg by mouth 2 (two) times daily.       Marland Kitchen FLAXSEED, LINSEED, PO Take by mouth.            Review of Systems Constitutional:  Negative for fever, chills, activity change and unexpected weight change.  HEENT:  Negative for hearing loss, ear pain, congestion, neck stiffness and postnasal drip. Negative for sore throat or swallowing problems. Negative for dental complaints.   Eyes: Negative for vision  loss or change in visual acuity.  Respiratory: Negative for chest tightness and wheezing. Negative for DOE.   Cardiovascular: Negative for chest pain or palpitations. No decreased exercise tolerance Gastrointestinal: No change in bowel habit. No bloating or gas. No reflux or indigestion Genitourinary: Negative for urgency, frequency, flank pain and difficulty urinating.  Musculoskeletal: Negative for myalgias, back pain, arthralgias and gait problem.  Neurological: Negative for dizziness, tremors, weakness and headaches.  Hematological: Negative for adenopathy.  Psychiatric/Behavioral: Negative for behavioral problems and dysphoric mood.       Objective:   Physical Exam Filed Vitals:   10/31/12 1539  BP: 142/88  Pulse: 65  Temp: 98.6 F (37 C)  Resp: 12   Wt Readings from Last 3 Encounters:  10/31/12 206 lb 1.3 oz (93.477 kg)  06/29/10 206 lb (93.441 kg)  06/08/09 202 lb (91.627 kg)   Gen'l: Well nourished well developed  white male in no acute distress  HEENT: Head: Normocephalic and atraumatic. Right Ear: External ear normal. EAC/TM nl. Left Ear: External ear normal.  EAC/TM nl. Nose: Nose normal. Mouth/Throat: Oropharynx is clear and moist. Dentition - native, in good repair. No buccal or palatal lesions. Posterior pharynx clear. Eyes: Conjunctivae and sclera clear. EOM intact. Pupils are equal, round, and reactive to light. Right eye exhibits no discharge. Left eye exhibits no discharge. Neck: Normal range of motion. Neck supple. No JVD present. No tracheal deviation present. No thyromegaly present.  Cardiovascular: Normal rate, regular rhythm, no gallop, no friction rub, no murmur heard.      Quiet precordium. 2+ radial and DP pulses . No carotid bruits Pulmonary/Chest: Effort normal. No respiratory distress or increased WOB, no wheezes, no rales. No chest wall deformity or CVAT. Abdomen: Soft. Bowel sounds are normal in all quadrants. He exhibits no distension, no tenderness, no rebound or guarding, No heptosplenomegaly  Genitourinary:  deferred Musculoskeletal: Normal range of motion. He exhibits no edema and no tenderness.       Small and large joints without redness, synovial thickening or deformity. Full range of motion preserved about all small, median and large joints.  Lymphadenopathy:    He has no cervical or supraclavicular adenopathy.  Neurological: He is alert and oriented to person, place, and time. CN II-XII intact. DTRs 2+ and symmetrical biceps, radial and patellar tendons. Cerebellar function normal with no tremor, rigidity, normal gait and station.  Skin: Skin is warm and dry. No rash noted. No erythema.  Psychiatric: He has a normal mood and affect. His behavior is normal. Thought content normal.   Lab Results  Component Value Date   WBC 8.0 06/30/2011   HGB 15.5 06/30/2011   HCT 44.9 06/30/2011   PLT 230.0 06/30/2011   GLUCOSE 81 10/31/2012   CHOL 160 10/31/2012   TRIG 182.0* 10/31/2012    HDL 56.30 10/31/2012   LDLCALC 67 10/31/2012        ALT 36 10/31/2012   AST 30 10/31/2012        NA 140 10/31/2012   K 4.3 10/31/2012   CL 104 10/31/2012   CREATININE 1.0 10/31/2012   BUN 21 10/31/2012   CO2 28 10/31/2012   TSH 1.78 06/30/2011   PSA 0.74 06/30/2011           Assessment & Plan:

## 2012-11-01 NOTE — Assessment & Plan Note (Signed)
Blood pressure: in the grey zone - majority of readings are below 140. We consider prehypertension as a condition for which we consider treatment. It is dependent on the whole person and their overall risk. With an overall health being good it is very reasonable to not start medication at this time.   Please monitor your blood pressure 3 times a week for a month or so. If more than 50% of the time the  BP is 140 or higher we will need to treat. If it is less than 140 most of the time we can watch and wait.

## 2012-11-24 ENCOUNTER — Other Ambulatory Visit: Payer: Self-pay | Admitting: Internal Medicine

## 2012-11-28 ENCOUNTER — Encounter: Payer: Self-pay | Admitting: Internal Medicine

## 2013-10-08 ENCOUNTER — Ambulatory Visit (INDEPENDENT_AMBULATORY_CARE_PROVIDER_SITE_OTHER): Payer: Self-pay

## 2013-10-08 ENCOUNTER — Encounter: Payer: Self-pay | Admitting: Internal Medicine

## 2013-10-08 DIAGNOSIS — Z23 Encounter for immunization: Secondary | ICD-10-CM

## 2013-11-05 ENCOUNTER — Other Ambulatory Visit: Payer: Self-pay | Admitting: Dermatology

## 2013-12-02 ENCOUNTER — Encounter: Payer: Self-pay | Admitting: Internal Medicine

## 2014-01-15 ENCOUNTER — Ambulatory Visit (AMBULATORY_SURGERY_CENTER): Payer: BC Managed Care – PPO | Admitting: *Deleted

## 2014-01-15 VITALS — Ht 70.0 in | Wt 225.8 lb

## 2014-01-15 DIAGNOSIS — Z86010 Personal history of colonic polyps: Secondary | ICD-10-CM

## 2014-01-15 MED ORDER — MOVIPREP 100 G PO SOLR: ORAL | Status: DC

## 2014-01-15 NOTE — Progress Notes (Signed)
No allergies to eggs or soy. No problems with anesthesia.  

## 2014-01-21 ENCOUNTER — Encounter: Payer: Self-pay | Admitting: Internal Medicine

## 2014-01-27 ENCOUNTER — Encounter: Payer: Self-pay | Admitting: Internal Medicine

## 2014-01-27 ENCOUNTER — Ambulatory Visit (AMBULATORY_SURGERY_CENTER): Payer: BC Managed Care – PPO | Admitting: Internal Medicine

## 2014-01-27 ENCOUNTER — Encounter: Payer: BC Managed Care – PPO | Admitting: Internal Medicine

## 2014-01-27 VITALS — BP 141/83 | HR 56 | Temp 96.4°F | Resp 16 | Ht 70.0 in | Wt 225.0 lb

## 2014-01-27 DIAGNOSIS — Z86010 Personal history of colonic polyps: Secondary | ICD-10-CM

## 2014-01-27 DIAGNOSIS — D126 Benign neoplasm of colon, unspecified: Secondary | ICD-10-CM

## 2014-01-27 MED ORDER — SODIUM CHLORIDE 0.9 % IV SOLN: 500.0000 mL | INTRAVENOUS | Status: DC

## 2014-01-27 NOTE — Op Note (Signed)
[facility]  [address] [place], [id number]   COLONOSCOPY PROCEDURE REPORT  PATIENT: Brian Ayers, Brian Ayers  MR#: [id number] BIRTHDATE: Mar 18, 1951 , 23  yrs. old GENDER: Male ENDOSCOPIST: Eustace Quail, MD REFERRED KK:[id number] Program Recall PROCEDURE DATE:  01/27/2014 PROCEDURE:   Colonoscopy with snare polypectomy x 1 First Screening Colonoscopy - Avg.  risk and is 50 yrs.  old or older - No.  Prior Negative Screening - Now for repeat screening. N/A  History of Adenoma - Now for follow-up colonoscopy & has been > or = to 3 yrs.  Yes hx of adenoma.  Has been 3 or more years since last colonoscopy.  Polyps Removed Today? Yes. ASA CLASS:   Class II INDICATIONS:Patient's personal history of adenomatous colon polyps. Index exam 2005 with advanced lesion (large tubulovillous adenoma); followup 2006, 2009.Marland Kitchen MEDICATIONS: MAC sedation, administered by CRNA and propofol (Diprivan) 350mg  IV DESCRIPTION OF PROCEDURE:   After the risks benefits and alternatives of the procedure were thoroughly explained, informed consent was obtained.  A digital rectal exam revealed no abnormalities of the rectum.   The [id number]  endoscope was introduced through the anus and advanced to the cecum, which was identified by both the appendix and ileocecal valve. No adverse events experienced.   The quality of the prep was excellent, using MoviPrep  The instrument was then slowly withdrawn as the colon was fully examined.  COLON FINDINGS: A diminutive polyp was found in the ascending colon. A polypectomy was performed with a cold snare.  The resection was complete and the polyp tissue was completely retrieved.   Moderate diverticulosis was noted The finding was in the left colon.   The colon mucosa was otherwise normal.  Retroflexed views revealed internal hemorrhoids. The time to cecum=2 minutes 56 seconds. Withdrawal time=10 minutes 17 seconds.  The scope was withdrawn  and the procedure completed. COMPLICATIONS: There were no complications.  ENDOSCOPIC IMPRESSION: 1.   Diminutive polyp was found in the ascending colon; polypectomy was performed with a cold snare 2.   Moderate diverticulosis was noted in the left colon 3.   The colon mucosa was otherwise normal  RECOMMENDATIONS: 1. Follow up colonoscopy in 5 years   eSigned:  Eustace Quail, MD 01/27/2014 9:39 AM   cc: Neena Rhymes, MD and The Patient

## 2014-01-27 NOTE — Progress Notes (Signed)
Called to room to assist during endoscopic procedure.  Patient ID and intended procedure confirmed with present staff. Received instructions for my participation in the procedure from the performing physician.  

## 2014-01-27 NOTE — Patient Instructions (Signed)
YOU HAD AN ENDOSCOPIC PROCEDURE TODAY AT THE [facility]: Refer to the procedure report that was given to you for any specific questions about what was found during the examination.  If the procedure report does not answer your questions, please call your gastroenterologist to clarify.  If you requested that your care partner not be given the details of your procedure findings, then the procedure report has been included in a sealed envelope for you to review at your convenience later.  YOU SHOULD EXPECT: Some feelings of bloating in the abdomen. Passage of more gas than usual.  Walking can help get rid of the air that was put into your GI tract during the procedure and reduce the bloating. If you had a lower endoscopy (such as a colonoscopy or flexible sigmoidoscopy) you may notice spotting of blood in your stool or on the toilet paper. If you underwent a bowel prep for your procedure, then you may not have a normal bowel movement for a few days.  DIET: Your first meal following the procedure should be a light meal and then it is ok to progress to your normal diet.  A half-sandwich or bowl of soup is an example of a good first meal.  Heavy or fried foods are harder to digest and may make you feel nauseous or bloated.  Likewise meals heavy in dairy and vegetables can cause extra gas to form and this can also increase the bloating.  Drink plenty of fluids but you should avoid alcoholic beverages for 24 hours.  ACTIVITY: Your care partner should take you home directly after the procedure.  You should plan to take it easy, moving slowly for the rest of the day.  You can resume normal activity the day after the procedure however you should NOT DRIVE or use heavy machinery for 24 hours (because of the sedation medicines used during the test).    SYMPTOMS TO REPORT IMMEDIATELY: A gastroenterologist can be reached at any hour.  During normal business hours, 8:30 AM to 5:00 PM Monday through Friday,  call [phone].  After hours and on weekends, please call the GI answering service at [phone] who will take a message and have the physician on call contact you.   Following lower endoscopy (colonoscopy or flexible sigmoidoscopy):  Excessive amounts of blood in the stool  Significant tenderness or worsening of abdominal pains  Swelling of the abdomen that is new, acute  Fever of 100F or higher  FOLLOW UP: If any biopsies were taken you will be contacted by phone or by letter within the next 1-3 weeks.  Call your gastroenterologist if you have not heard about the biopsies in 3 weeks.  Our staff will call the home number listed on your records the next business day following your procedure to check on you and address any questions or concerns that you may have at that time regarding the information given to you following your procedure. This is a courtesy call and so if there is no answer at the home number and we have not heard from you through the emergency physician on call, we will assume that you have returned to your regular daily activities without incident.  SIGNATURES/CONFIDENTIALITY: You and/or your care partner have signed paperwork which will be entered into your electronic medical record.  These signatures attest to the fact that that the information above on your After Visit Summary has been reviewed and is understood.  Full responsibility of the confidentiality of this   discharge information lies with you and/or your care-partner.  Polyp, Diverticulosis, high fiber diet-handouts given  Repeat colonoscopy in 5 years.

## 2014-01-28 ENCOUNTER — Telehealth: Payer: Self-pay | Admitting: *Deleted

## 2014-01-28 NOTE — Telephone Encounter (Signed)
  Follow up Call-  Call back number 01/27/2014  Post procedure Call Back phone  # [phone]  Permission to leave phone message Yes     Patient questions:  Do you have a fever, pain , or abdominal swelling? no Pain Score  0 *  Have you tolerated food without any problems? yes  Have you been able to return to your normal activities? yes  Do you have any questions about your discharge instructions: Diet   no Medications  no Follow up visit  no  Do you have questions or concerns about your Care? no  Actions: * If pain score is 4 or above: No action needed, pain <4.

## 2014-02-02 ENCOUNTER — Encounter: Payer: Self-pay | Admitting: Internal Medicine

## 2014-02-12 ENCOUNTER — Other Ambulatory Visit: Payer: Self-pay | Admitting: Internal Medicine

## 2014-05-25 ENCOUNTER — Encounter: Payer: Self-pay | Admitting: Internal Medicine

## 2014-05-25 ENCOUNTER — Ambulatory Visit (INDEPENDENT_AMBULATORY_CARE_PROVIDER_SITE_OTHER): Payer: BC Managed Care – PPO | Admitting: Internal Medicine

## 2014-05-25 VITALS — BP 182/100 | HR 80 | Temp 98.0°F | Resp 16 | Ht 70.0 in | Wt 223.0 lb

## 2014-05-25 DIAGNOSIS — Z23 Encounter for immunization: Secondary | ICD-10-CM

## 2014-05-25 DIAGNOSIS — Z0000 Encounter for general adult medical examination without abnormal findings: Secondary | ICD-10-CM | POA: Insufficient documentation

## 2014-05-25 DIAGNOSIS — E785 Hyperlipidemia, unspecified: Secondary | ICD-10-CM

## 2014-05-25 DIAGNOSIS — K219 Gastro-esophageal reflux disease without esophagitis: Secondary | ICD-10-CM

## 2014-05-25 DIAGNOSIS — R030 Elevated blood-pressure reading, without diagnosis of hypertension: Secondary | ICD-10-CM

## 2014-05-25 DIAGNOSIS — J310 Chronic rhinitis: Secondary | ICD-10-CM

## 2014-05-25 MED ORDER — VITAMIN D 1000 UNITS PO TABS: 1000.0000 [IU] | ORAL_TABLET | Freq: Every day | ORAL | Status: AC

## 2014-05-25 MED ORDER — LORATADINE 10 MG PO TABS
10.0000 mg | ORAL_TABLET | Freq: Every day | ORAL | Status: DC
Start: 2014-05-25 — End: 2015-02-19

## 2014-05-25 MED ORDER — RANITIDINE HCL 150 MG PO TABS: 150.0000 mg | ORAL_TABLET | Freq: Two times a day (BID) | ORAL | Status: DC

## 2014-05-25 NOTE — Assessment & Plan Note (Signed)
Continue with current prescription therapy as reflected on the Med list. Claritin prn Milk free diet x 1 mo

## 2014-05-25 NOTE — Assessment & Plan Note (Signed)
6/15 worse - d/c MVI, supplements Zantac prn

## 2014-05-25 NOTE — Assessment & Plan Note (Signed)
Rechecked BP was nl

## 2014-05-25 NOTE — Patient Instructions (Signed)
   Milk free trial (no milk, ice cream, cheese and yogurt) for 4-6 weeks. OK to use almond, coconut, rice milk. "Almond breeze" brand tastes good.  

## 2014-05-25 NOTE — Progress Notes (Signed)
Pre visit review using our clinic review tool, if applicable. No additional management support is needed unless otherwise documented below in the visit note. 

## 2014-05-25 NOTE — Assessment & Plan Note (Signed)
We discussed age appropriate health related issues, including available/recomended screening tests and vaccinations. We discussed a need for adhering to healthy diet and exercise. Labs/EKG were reviewed/ordered. All questions were answered.   

## 2014-05-25 NOTE — Assessment & Plan Note (Signed)
Take Crestor - qod ?

## 2014-05-25 NOTE — Progress Notes (Signed)
   Subjective:    HPI  The patient is here for a wellness exam. The patient has been doing well overall without major physical or psychological issues going on lately. C/o occ GERD and cough C/o allergies and asthma.  Dr Doristine Johns, Dr Henrene Pastor Dr Nicki Reaper, DO   Wt Stella from Last 3 Encounters:  05/25/14 223 lb (101.152 kg)  01/27/14 225 lb (102.059 kg)  01/15/14 225 lb 12.8 oz (102.422 kg)   BP Readings from Last 3 Encounters:  05/25/14 182/100  01/27/14 141/83  10/31/12 142/88      Review of Systems  Constitutional: Negative for appetite change, fatigue and unexpected weight change.  HENT: Negative for congestion, dental problem, nosebleeds, sneezing, sore throat and trouble swallowing.   Eyes: Negative for itching and visual disturbance.  Respiratory: Negative for cough.   Cardiovascular: Negative for chest pain, palpitations and leg swelling.  Gastrointestinal: Negative for nausea, diarrhea, blood in stool and abdominal distention.  Genitourinary: Negative for frequency and hematuria.  Musculoskeletal: Negative for back pain, gait problem, joint swelling and neck pain.  Skin: Negative for rash and wound.  Neurological: Negative for dizziness, tremors, speech difficulty and weakness.  Psychiatric/Behavioral: Negative for suicidal ideas, sleep disturbance, dysphoric mood and agitation. The patient is not nervous/anxious.        Objective:   Physical Exam  Constitutional: He is oriented to person, place, and time. He appears well-developed. No distress.  NAD  HENT:  Mouth/Throat: Oropharynx is clear and moist.  Eyes: Conjunctivae are normal. Pupils are equal, round, and reactive to light.  Neck: Normal range of motion. No JVD present. No thyromegaly present.  Cardiovascular: Normal rate, regular rhythm, normal heart sounds and intact distal pulses.  Exam reveals no gallop and no friction rub.   No murmur heard. Pulmonary/Chest: Effort normal and breath sounds normal. No  respiratory distress. He has no wheezes. He has no rales. He exhibits no tenderness.  Abdominal: Soft. Bowel sounds are normal. He exhibits no distension and no mass. There is no tenderness. There is no rebound and no guarding.  Musculoskeletal: Normal range of motion. He exhibits no edema and no tenderness.  Lymphadenopathy:    He has no cervical adenopathy.  Neurological: He is alert and oriented to person, place, and time. He has normal reflexes. No cranial nerve deficit. He exhibits normal muscle tone. He displays a negative Romberg sign. Coordination and gait normal.  No meningeal signs  Skin: Skin is warm and dry. No rash noted.  Psychiatric: He has a normal mood and affect. His behavior is normal. Judgment and thought content normal.   Lab Results  Component Value Date   WBC 8.0 06/30/2011   HGB 15.5 06/30/2011   HCT 44.9 06/30/2011   PLT 230.0 06/30/2011   GLUCOSE 81 10/31/2012   CHOL 160 10/31/2012   TRIG 182.0* 10/31/2012   HDL 56.30 10/31/2012   LDLCALC 67 10/31/2012   ALT 36 10/31/2012   ALT 36 10/31/2012   AST 30 10/31/2012   AST 30 10/31/2012   NA 140 10/31/2012   K 4.3 10/31/2012   CL 104 10/31/2012   CREATININE 1.0 10/31/2012   BUN 21 10/31/2012   CO2 28 10/31/2012   TSH 1.78 06/30/2011   PSA 0.74 06/30/2011          Assessment & Plan:

## 2014-05-26 ENCOUNTER — Other Ambulatory Visit (INDEPENDENT_AMBULATORY_CARE_PROVIDER_SITE_OTHER): Payer: BC Managed Care – PPO

## 2014-05-26 DIAGNOSIS — Z0000 Encounter for general adult medical examination without abnormal findings: Secondary | ICD-10-CM

## 2014-05-26 LAB — URINALYSIS
BILIRUBIN URINE: NEGATIVE
Hgb urine dipstick: NEGATIVE
KETONES UR: NEGATIVE
LEUKOCYTES UA: NEGATIVE
Nitrite: NEGATIVE
PH: 6 (ref 5.0–8.0)
Specific Gravity, Urine: <=1.005 — AB (ref 1.000–1.030)
Total Protein, Urine: NEGATIVE
UROBILINOGEN UA: 0.2 (ref 0.0–1.0)
Urine Glucose: NEGATIVE

## 2014-05-26 LAB — CBC WITH DIFFERENTIAL/PLATELET
BASOS ABS: 0 10*3/uL (ref 0.0–0.1)
Basophils Relative: 0.5 % (ref 0.0–3.0)
Eosinophils Absolute: 0.3 10*3/uL (ref 0.0–0.7)
Eosinophils Relative: 3.9 % (ref 0.0–5.0)
HCT: 43.7 % (ref 39.0–52.0)
HEMOGLOBIN: 14.9 g/dL (ref 13.0–17.0)
Lymphocytes Relative: 39 % (ref 12.0–46.0)
Lymphs Abs: 3.2 10*3/uL (ref 0.7–4.0)
MCHC: 34 g/dL (ref 30.0–36.0)
MCV: 91.8 fl (ref 78.0–100.0)
MONOS PCT: 10.4 % (ref 3.0–12.0)
Monocytes Absolute: 0.8 10*3/uL (ref 0.1–1.0)
NEUTROS ABS: 3.8 10*3/uL (ref 1.4–7.7)
Neutrophils Relative %: 46.2 % (ref 43.0–77.0)
PLATELETS: 229 10*3/uL (ref 150.0–400.0)
RBC: 4.76 Mil/uL (ref 4.22–5.81)
RDW: 12.5 % (ref 11.5–15.5)
WBC: 8.2 10*3/uL (ref 4.0–10.5)

## 2014-05-26 LAB — HEPATIC FUNCTION PANEL
ALBUMIN: 4.1 g/dL (ref 3.5–5.2)
ALT: 41 U/L (ref 0–53)
AST: 29 U/L (ref 0–37)
Alkaline Phosphatase: 60 U/L (ref 39–117)
Bilirubin, Direct: 0.2 mg/dL (ref 0.0–0.3)
TOTAL PROTEIN: 6.3 g/dL (ref 6.0–8.3)
Total Bilirubin: 1.2 mg/dL (ref 0.2–1.2)

## 2014-05-26 LAB — BASIC METABOLIC PANEL
BUN: 15 mg/dL (ref 6–23)
CO2: 28 mEq/L (ref 19–32)
Calcium: 9.4 mg/dL (ref 8.4–10.5)
Chloride: 104 mEq/L (ref 96–112)
Creatinine, Ser: 0.9 mg/dL (ref 0.4–1.5)
GFR: 96.09 mL/min (ref >60.00)
GLUCOSE: 175 mg/dL — AB (ref 70–99)
Potassium: 4.4 mEq/L (ref 3.5–5.1)
SODIUM: 138 meq/L (ref 135–145)

## 2014-05-26 LAB — LIPID PANEL
CHOLESTEROL: 177 mg/dL (ref 0–200)
HDL: 51.8 mg/dL (ref >39.00)
LDL Cholesterol: 102 mg/dL — ABNORMAL HIGH (ref 0–99)
NonHDL: 125.2
TRIGLYCERIDES: 118 mg/dL (ref 0.0–149.0)
Total CHOL/HDL Ratio: 3
VLDL: 23.6 mg/dL (ref 0.0–40.0)

## 2014-05-26 LAB — TSH: TSH: 2.5 u[IU]/mL (ref 0.35–4.50)

## 2014-05-26 LAB — PSA: PSA: 0.82 ng/mL (ref 0.10–4.00)

## 2014-05-26 NOTE — Progress Notes (Signed)
Pt had his allergy injections at [facility]. He came back on 05/26/14 for pneumovax.

## 2014-05-26 NOTE — Addendum Note (Signed)
Addended by: Cresenciano Lick on: 05/26/2014 09:50 AM   Modules accepted: Orders

## 2014-05-28 ENCOUNTER — Ambulatory Visit: Payer: BC Managed Care – PPO

## 2014-05-28 ENCOUNTER — Other Ambulatory Visit: Payer: Self-pay | Admitting: Internal Medicine

## 2014-05-28 DIAGNOSIS — R7309 Other abnormal glucose: Secondary | ICD-10-CM

## 2014-05-28 DIAGNOSIS — IMO0002 Reserved for concepts with insufficient information to code with codable children: Secondary | ICD-10-CM

## 2014-05-28 DIAGNOSIS — E1165 Type 2 diabetes mellitus with hyperglycemia: Secondary | ICD-10-CM

## 2014-05-28 LAB — HEMOGLOBIN A1C: Hgb A1c MFr Bld: 8.6 % — ABNORMAL HIGH (ref 4.6–6.5)

## 2014-05-28 MED ORDER — METFORMIN HCL 500 MG PO TABS: 500.0000 mg | ORAL_TABLET | Freq: Every day | ORAL | Status: DC

## 2014-05-28 NOTE — Assessment & Plan Note (Signed)
Start Metformin

## 2014-05-29 ENCOUNTER — Telehealth: Payer: Self-pay | Admitting: Internal Medicine

## 2014-05-29 NOTE — Telephone Encounter (Signed)
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

## 2014-05-29 NOTE — Telephone Encounter (Signed)
It is FAA approved: pls type a letter Thx

## 2014-06-01 ENCOUNTER — Encounter: Payer: Self-pay | Admitting: Internal Medicine

## 2014-06-01 ENCOUNTER — Ambulatory Visit (INDEPENDENT_AMBULATORY_CARE_PROVIDER_SITE_OTHER): Payer: BC Managed Care – PPO | Admitting: *Deleted

## 2014-06-01 DIAGNOSIS — IMO0001 Reserved for inherently not codable concepts without codable children: Secondary | ICD-10-CM

## 2014-06-01 DIAGNOSIS — E1165 Type 2 diabetes mellitus with hyperglycemia: Secondary | ICD-10-CM

## 2014-06-01 DIAGNOSIS — IMO0002 Reserved for concepts with insufficient information to code with codable children: Secondary | ICD-10-CM

## 2014-06-01 LAB — GLUCOSE, POCT (MANUAL RESULT ENTRY): POC GLUCOSE: 210 mg/dL — AB (ref 70–99)

## 2014-06-01 NOTE — Progress Notes (Signed)
I spoke with patient about his elevated CBGs. I advised him to cut back on sugars and carbs, take his Metformin as directed, check his am fasting blood sugars, keep a log and bring it at his 06/09/14 OV with PCP. Pt and wife verbalize understanding.

## 2014-06-01 NOTE — Telephone Encounter (Signed)
Letter pended for MD return back to office to review and sign.

## 2014-06-01 NOTE — Telephone Encounter (Signed)
I am unsure of verbiage for this type of letter. May need to be completed by clinical staff or tell me verbiage and I will copy and paste.

## 2014-06-03 ENCOUNTER — Encounter: Payer: Self-pay | Admitting: Internal Medicine

## 2014-06-04 ENCOUNTER — Other Ambulatory Visit: Payer: Self-pay | Admitting: Internal Medicine

## 2014-06-04 ENCOUNTER — Encounter: Payer: Self-pay | Admitting: Internal Medicine

## 2014-06-04 MED ORDER — ROSUVASTATIN CALCIUM 10 MG PO TABS: 10.0000 mg | ORAL_TABLET | Freq: Every day | ORAL | Status: DC

## 2014-06-04 NOTE — Telephone Encounter (Signed)
Letter mailed to patient. Pt informed

## 2014-06-05 ENCOUNTER — Other Ambulatory Visit: Payer: Self-pay | Admitting: Internal Medicine

## 2014-06-05 MED ORDER — METFORMIN HCL 500 MG PO TABS: 500.0000 mg | ORAL_TABLET | Freq: Two times a day (BID) | ORAL | Status: DC

## 2014-06-08 ENCOUNTER — Other Ambulatory Visit: Payer: Self-pay | Admitting: *Deleted

## 2014-06-08 MED ORDER — FREESTYLE UNISTICK II LANCETS MISC: 1.0000 | Freq: Two times a day (BID) | Status: DC

## 2014-06-08 MED ORDER — GLUCOSE BLOOD VI STRP: ORAL_STRIP | Status: DC

## 2014-06-09 ENCOUNTER — Ambulatory Visit (INDEPENDENT_AMBULATORY_CARE_PROVIDER_SITE_OTHER): Payer: BC Managed Care – PPO | Admitting: Internal Medicine

## 2014-06-09 ENCOUNTER — Ambulatory Visit: Payer: BC Managed Care – PPO

## 2014-06-09 ENCOUNTER — Encounter: Payer: Self-pay | Admitting: Internal Medicine

## 2014-06-09 VITALS — BP 140/80 | HR 76 | Temp 98.6°F | Resp 16 | Wt 214.0 lb

## 2014-06-09 DIAGNOSIS — IMO0001 Reserved for inherently not codable concepts without codable children: Secondary | ICD-10-CM

## 2014-06-09 DIAGNOSIS — R030 Elevated blood-pressure reading, without diagnosis of hypertension: Secondary | ICD-10-CM

## 2014-06-09 DIAGNOSIS — Z23 Encounter for immunization: Secondary | ICD-10-CM

## 2014-06-09 DIAGNOSIS — E1165 Type 2 diabetes mellitus with hyperglycemia: Secondary | ICD-10-CM

## 2014-06-09 DIAGNOSIS — IMO0002 Reserved for concepts with insufficient information to code with codable children: Secondary | ICD-10-CM

## 2014-06-09 DIAGNOSIS — Z2911 Encounter for prophylactic immunotherapy for respiratory syncytial virus (RSV): Secondary | ICD-10-CM

## 2014-06-09 DIAGNOSIS — E785 Hyperlipidemia, unspecified: Secondary | ICD-10-CM

## 2014-06-09 NOTE — Progress Notes (Signed)
Pre visit review using our clinic review tool, if applicable. No additional management support is needed unless otherwise documented below in the visit note. 

## 2014-06-09 NOTE — Assessment & Plan Note (Addendum)
Started on Rx. Doing well on bid Metformin RTC 3 mo Discussed at length

## 2014-06-09 NOTE — Assessment & Plan Note (Signed)
Use Crestor qod

## 2014-06-09 NOTE — Progress Notes (Signed)
   Subjective:    HPI  F/u DM 2 - new onset  C/o occ GERD and cough C/o allergies and asthma.   Dr Doristine Johns, Dr Henrene Pastor Dr Nicki Reaper, DO   Wt Readings from Last 3 Encounters:  06/09/14 214 lb (97.07 kg)  05/25/14 223 lb (101.152 kg)  01/27/14 225 lb (102.059 kg)   BP Readings from Last 3 Encounters:  06/09/14 140/80  05/25/14 182/100  01/27/14 141/83      Review of Systems  Constitutional: Negative for appetite change, fatigue and unexpected weight change.  HENT: Negative for congestion, dental problem, nosebleeds, sneezing, sore throat and trouble swallowing.   Eyes: Negative for itching and visual disturbance.  Respiratory: Negative for cough.   Cardiovascular: Negative for chest pain, palpitations and leg swelling.  Gastrointestinal: Negative for nausea, diarrhea, blood in stool and abdominal distention.  Genitourinary: Negative for frequency and hematuria.  Musculoskeletal: Negative for back pain, gait problem, joint swelling and neck pain.  Skin: Negative for rash and wound.  Neurological: Negative for dizziness, tremors, speech difficulty and weakness.  Psychiatric/Behavioral: Negative for suicidal ideas, sleep disturbance, dysphoric mood and agitation. The patient is not nervous/anxious.        Objective:   Physical Exam  Constitutional: He is oriented to person, place, and time. He appears well-developed. No distress.  NAD  HENT:  Mouth/Throat: Oropharynx is clear and moist.  Eyes: Conjunctivae are normal. Pupils are equal, round, and reactive to light.  Neck: Normal range of motion. No JVD present. No thyromegaly present.  Cardiovascular: Normal rate, regular rhythm, normal heart sounds and intact distal pulses.  Exam reveals no gallop and no friction rub.   No murmur heard. Pulmonary/Chest: Effort normal and breath sounds normal. No respiratory distress. He has no wheezes. He has no rales. He exhibits no tenderness.  Abdominal: Soft. Bowel sounds are normal.  He exhibits no distension and no mass. There is no tenderness. There is no rebound and no guarding.  Musculoskeletal: Normal range of motion. He exhibits no edema and no tenderness.  Lymphadenopathy:    He has no cervical adenopathy.  Neurological: He is alert and oriented to person, place, and time. He has normal reflexes. No cranial nerve deficit. He exhibits normal muscle tone. He displays a negative Romberg sign. Coordination and gait normal.  No meningeal signs  Skin: Skin is warm and dry. No rash noted.  Psychiatric: He has a normal mood and affect. His behavior is normal. Judgment and thought content normal.   Lab Results  Component Value Date   WBC 8.2 05/26/2014   HGB 14.9 05/26/2014   HCT 43.7 05/26/2014   PLT 229.0 05/26/2014   GLUCOSE 175* 05/26/2014   CHOL 177 05/26/2014   TRIG 118.0 05/26/2014   HDL 51.80 05/26/2014   LDLCALC 102* 05/26/2014   ALT 41 05/26/2014   AST 29 05/26/2014   NA 138 05/26/2014   K 4.4 05/26/2014   CL 104 05/26/2014   CREATININE 0.9 05/26/2014   BUN 15 05/26/2014   CO2 28 05/26/2014   TSH 2.50 05/26/2014   PSA 0.82 05/26/2014   HGBA1C 8.6* 05/28/2014          Assessment & Plan:

## 2014-06-09 NOTE — Assessment & Plan Note (Signed)
Nl BP at home 

## 2014-06-22 ENCOUNTER — Other Ambulatory Visit (INDEPENDENT_AMBULATORY_CARE_PROVIDER_SITE_OTHER): Payer: BC Managed Care – PPO

## 2014-06-22 DIAGNOSIS — IMO0001 Reserved for inherently not codable concepts without codable children: Secondary | ICD-10-CM

## 2014-06-22 DIAGNOSIS — E1165 Type 2 diabetes mellitus with hyperglycemia: Secondary | ICD-10-CM

## 2014-06-22 DIAGNOSIS — IMO0002 Reserved for concepts with insufficient information to code with codable children: Secondary | ICD-10-CM

## 2014-06-22 LAB — BASIC METABOLIC PANEL
BUN: 16 mg/dL (ref 6–23)
CHLORIDE: 105 meq/L (ref 96–112)
CO2: 27 meq/L (ref 19–32)
Calcium: 9.6 mg/dL (ref 8.4–10.5)
Creatinine, Ser: 1 mg/dL (ref 0.4–1.5)
GFR: 80.72 mL/min (ref >60.00)
Glucose, Bld: 139 mg/dL — ABNORMAL HIGH (ref 70–99)
POTASSIUM: 5.4 meq/L — AB (ref 3.5–5.1)
SODIUM: 140 meq/L (ref 135–145)

## 2014-06-22 LAB — HEMOGLOBIN A1C: Hgb A1c MFr Bld: 7.6 % — ABNORMAL HIGH (ref 4.6–6.5)

## 2014-06-23 ENCOUNTER — Encounter: Payer: Self-pay | Admitting: Internal Medicine

## 2014-06-23 ENCOUNTER — Ambulatory Visit (INDEPENDENT_AMBULATORY_CARE_PROVIDER_SITE_OTHER): Payer: BC Managed Care – PPO | Admitting: Internal Medicine

## 2014-06-23 VITALS — BP 130/84 | HR 68 | Temp 98.3°F | Resp 16 | Wt 211.0 lb

## 2014-06-23 DIAGNOSIS — E785 Hyperlipidemia, unspecified: Secondary | ICD-10-CM

## 2014-06-23 DIAGNOSIS — E119 Type 2 diabetes mellitus without complications: Secondary | ICD-10-CM | POA: Insufficient documentation

## 2014-06-23 DIAGNOSIS — R030 Elevated blood-pressure reading, without diagnosis of hypertension: Secondary | ICD-10-CM

## 2014-06-23 NOTE — Assessment & Plan Note (Signed)
Continue with current prescription therapy as reflected on the Med list.  

## 2014-06-23 NOTE — Assessment & Plan Note (Signed)
Doing well 

## 2014-06-23 NOTE — Progress Notes (Signed)
Pre visit review using our clinic review tool, if applicable. No additional management support is needed unless otherwise documented below in the visit note. 

## 2014-06-23 NOTE — Progress Notes (Signed)
Patient ID: Brian Ayers, male   DOB: Nov 19, 1951, 63 y.o.   MRN: [id number]   Subjective:    HPI  F/u DM 2 - new onset - doing well on Metformin bid  F/u occ GERD and cough - doing well F/u allergies and asthma - controlled  Dr Doristine Johns, Dr Henrene Pastor Dr Nicki Reaper, DO   Wt Readings from Last 3 Encounters:  06/23/14 211 lb (95.709 kg)  06/09/14 214 lb (97.07 kg)  05/25/14 223 lb (101.152 kg)   BP Readings from Last 3 Encounters:  06/23/14 130/84  06/09/14 140/80  05/25/14 182/100      Review of Systems  Constitutional: Negative for appetite change, fatigue and unexpected weight change.  HENT: Negative for congestion, dental problem, nosebleeds, sneezing, sore throat and trouble swallowing.   Eyes: Negative for itching and visual disturbance.  Respiratory: Negative for cough.   Cardiovascular: Negative for chest pain, palpitations and leg swelling.  Gastrointestinal: Negative for nausea, diarrhea, blood in stool and abdominal distention.  Genitourinary: Negative for frequency and hematuria.  Musculoskeletal: Negative for back pain, gait problem, joint swelling and neck pain.  Skin: Negative for rash and wound.  Neurological: Negative for dizziness, tremors, speech difficulty and weakness.  Psychiatric/Behavioral: Negative for suicidal ideas, sleep disturbance, dysphoric mood and agitation. The patient is not nervous/anxious.        Objective:   Physical Exam  Constitutional: He is oriented to person, place, and time. He appears well-developed. No distress.  NAD  HENT:  Mouth/Throat: Oropharynx is clear and moist.  Eyes: Conjunctivae are normal. Pupils are equal, round, and reactive to light.  Neck: Normal range of motion. No JVD present. No thyromegaly present.  Cardiovascular: Normal rate, regular rhythm, normal heart sounds and intact distal pulses.  Exam reveals no gallop and no friction rub.   No murmur heard. Pulmonary/Chest: Effort normal and breath sounds normal. No  respiratory distress. He has no wheezes. He has no rales. He exhibits no tenderness.  Abdominal: Soft. Bowel sounds are normal. He exhibits no distension and no mass. There is no tenderness. There is no rebound and no guarding.  Musculoskeletal: Normal range of motion. He exhibits no edema and no tenderness.  Lymphadenopathy:    He has no cervical adenopathy.  Neurological: He is alert and oriented to person, place, and time. He has normal reflexes. No cranial nerve deficit. He exhibits normal muscle tone. He displays a negative Romberg sign. Coordination and gait normal.  Skin: Skin is warm and dry. No rash noted.  Psychiatric: He has a normal mood and affect. His behavior is normal. Judgment and thought content normal.   Lab Results  Component Value Date   WBC 8.2 05/26/2014   HGB 14.9 05/26/2014   HCT 43.7 05/26/2014   PLT 229.0 05/26/2014   GLUCOSE 139* 06/22/2014   CHOL 177 05/26/2014   TRIG 118.0 05/26/2014   HDL 51.80 05/26/2014   LDLCALC 102* 05/26/2014   ALT 41 05/26/2014   AST 29 05/26/2014   NA 140 06/22/2014   K 5.4* 06/22/2014   CL 105 06/22/2014   CREATININE 1.0 06/22/2014   BUN 16 06/22/2014   CO2 27 06/22/2014   TSH 2.50 05/26/2014   PSA 0.82 05/26/2014   HGBA1C 7.6* 06/22/2014          Assessment & Plan:

## 2014-07-01 ENCOUNTER — Encounter: Payer: Self-pay | Admitting: Internal Medicine

## 2014-07-07 ENCOUNTER — Encounter: Payer: BC Managed Care – PPO | Admitting: Sports Medicine

## 2014-07-10 ENCOUNTER — Ambulatory Visit: Payer: BC Managed Care – PPO | Admitting: Family Medicine

## 2014-07-15 LAB — HM DIABETES EYE EXAM

## 2014-07-16 ENCOUNTER — Telehealth: Payer: Self-pay | Admitting: Internal Medicine

## 2014-07-16 NOTE — Telephone Encounter (Signed)
Rec'd [facility] forward 1 page Dr. Alain Marion

## 2014-08-04 ENCOUNTER — Ambulatory Visit: Payer: BC Managed Care – PPO

## 2014-08-04 VITALS — BP 142/74 | HR 86 | Resp 12

## 2014-08-04 DIAGNOSIS — Q828 Other specified congenital malformations of skin: Secondary | ICD-10-CM

## 2014-08-04 DIAGNOSIS — E119 Type 2 diabetes mellitus without complications: Secondary | ICD-10-CM

## 2014-08-04 DIAGNOSIS — B070 Plantar wart: Secondary | ICD-10-CM

## 2014-08-04 NOTE — Progress Notes (Signed)
   Subjective:    Patient ID: Brian Ayers, male    DOB: September 19, 1951, 63 y.o.   MRN: [id number]  HPI  PT STATED B/L BOTTOM HEELS AND BALL OF THE FOOT HAVE DRY AND THICK SKIN FOR LONG TERM 20 YEARS. THE FEET ARE GETTING WORSE AND DRY. TRIED GO TO NAIL SKIN AND VASELINE BUT NO HELP.    Review of Systems  Allergic/Immunologic: Positive for environmental allergies.  All other systems reviewed and are negative.      Objective:   Physical Exam 62 year old white male well-developed well-nourished oriented to person system for from Dr. Laurian Brim Patient does recently diagnosed with diabetes no significant medications as far. Does have some keratoses that goes up on his skin on both feet also some cold sensation seated times.  Lower extremity objective findings neurovascular status is intact pedal pulses palpable DP and PT +2/4 bilateral capillary refill time 3 seconds epicritic and proprioceptive sensations intact and symmetric temperature cool otherwise unremarkable For refill 3 seconds neurologic epicritic and progressive sensations intact and symmetric bilateral normal plantar response DTRs not listed neurologically skin color pigment normal hair growth absent nails somewhat criptotic incurvated otherwise unremarkable again neurologically keratoses sub-2 and sub-5 bilateral with acute porokeratosis versus verrucoid type lesions patient is been debriding these in the past going to the nail salon have these taken care of in the past no open wounds ulcerations no secondary infections remainder of exam unremarkable noncontributory. 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 There is normal plantar response and DTRs are intact and symmetric bilateral vibratory sensation intact bilateral      Assessment & Plan:  Assessment diabetes type 2 controlled without complication at this time patient does have some multiple porokeratosis versus verruca lesions sub-2 and 5  bilateral dispensed literature on topical salicylic acid application and utilizing Aquasol or Sal-Acid and the tape patient will followup in the future as needed also recommended urea cream for dry fissuring scaly skin recommended socks with shoes at all times no barefoot or flimsy shoes or flip-flops diabetic foot care instructions are dispensed suggest a 6-12 month long-term followup for reevaluation diabetic neuropathy.  Harriet Masson DPM

## 2014-08-04 NOTE — Patient Instructions (Signed)
Diabetes and Foot Care Diabetes may cause you to have problems because of poor blood supply (circulation) to your feet and legs. This may cause the skin on your feet to become thinner, break easier, and heal more slowly. Your skin may become dry, and the skin may peel and crack. You may also have nerve damage in your legs and feet causing decreased feeling in them. You may not notice minor injuries to your feet that could lead to infections or more serious problems. Taking care of your feet is one of the most important things you can do for yourself.  HOME CARE INSTRUCTIONS  Wear shoes at all times, even in the house. Do not go barefoot. Bare feet are easily injured.  Check your feet daily for blisters, cuts, and redness. If you cannot see the bottom of your feet, use a mirror or ask someone for help.  Wash your feet with warm water (do not use hot water) and mild soap. Then pat your feet and the areas between your toes until they are completely dry. Do not soak your feet as this can dry your skin.  Apply a moisturizing lotion or petroleum jelly (that does not contain alcohol and is unscented) to the skin on your feet and to dry, brittle toenails. Do not apply lotion between your toes.  Trim your toenails straight across. Do not dig under them or around the cuticle. File the edges of your nails with an emery board or nail file.  Do not cut corns or calluses or try to remove them with medicine.  Wear clean socks or stockings every day. Make sure they are not too tight. Do not wear knee-high stockings since they may decrease blood flow to your legs.  Wear shoes that fit properly and have enough cushioning. To break in new shoes, wear them for just a few hours a day. This prevents you from injuring your feet. Always look in your shoes before you put them on to be sure there are no objects inside.  Do not cross your legs. This may decrease the blood flow to your feet.  If you find a minor scrape,  cut, or break in the skin on your feet, keep it and the skin around it clean and dry. These areas may be cleansed with mild soap and water. Do not cleanse the area with peroxide, alcohol, or iodine.  When you remove an adhesive bandage, be sure not to damage the skin around it.  If you have a wound, look at it several times a day to make sure it is healing.  Do not use heating pads or hot water bottles. They may burn your skin. If you have lost feeling in your feet or legs, you may not know it is happening until it is too late.  Make sure your health care provider performs a complete foot exam at least annually or more often if you have foot problems. Report any cuts, sores, or bruises to your health care provider immediately. SEEK MEDICAL CARE IF:   You have an injury that is not healing.  You have cuts or breaks in the skin.  You have an ingrown nail.  You notice redness on your legs or feet.  You feel burning or tingling in your legs or feet.  You have pain or cramps in your legs and feet.  Your legs or feet are numb.  Your feet always feel cold. SEEK IMMEDIATE MEDICAL CARE IF:   There is increasing redness,   swelling, or pain in or around a wound.  There is a red line that goes up your leg.  Pus is coming from a wound.  You develop a fever or as directed by your health care provider.  You notice a bad smell coming from an ulcer or wound. Document Released: 12/01/2000 Document Revised: 08/06/2013 Document Reviewed: 05/13/2013 Exit[employer] Patient Information 2015 [employer]. This information is not intended to replace advice given to you by your health care provider. Make sure you discuss any questions you have with your health care provider.  

## 2014-08-10 ENCOUNTER — Ambulatory Visit (INDEPENDENT_AMBULATORY_CARE_PROVIDER_SITE_OTHER): Payer: BC Managed Care – PPO | Admitting: Family Medicine

## 2014-08-10 ENCOUNTER — Encounter: Payer: Self-pay | Admitting: Family Medicine

## 2014-08-10 VITALS — BP 142/78 | HR 67 | Ht 70.0 in | Wt 197.0 lb

## 2014-08-10 DIAGNOSIS — M7750 Other enthesopathy of unspecified foot: Secondary | ICD-10-CM

## 2014-08-10 DIAGNOSIS — M7740 Metatarsalgia, unspecified foot: Secondary | ICD-10-CM | POA: Insufficient documentation

## 2014-08-10 NOTE — Patient Instructions (Signed)
Good to meet you 2 hours for the first day, then increase 1-2 hours a day.  OK to run in it.  Ice is your friend Vitamin D 2000 IU daily.  See me when you need me.

## 2014-08-10 NOTE — Progress Notes (Signed)
  [name] [address] [place], [place] [address] Phone: [phone] Subjective:     CC:  Foot pain  YOV:[id number] Brian Ayers is a 63 y.o. male coming in with complaint of foot pain. Patient is an avid runner and is running about 20-30 miles a week. Patient states that he has been wearing custom orthotics for quite some time and has been very helpful. Patient is wondering if he could possibly get new orthotics. Patient states that otherwise he has forefoot pain. Patient describes it as a dull aching pain can be severe enough to make him stop running. Patient is doing supplementation as well as some icing with minimal benefit. Patient states that over the course of the last several months with his orthotics for greater than 49 years old he has some difficulty with finishing runs. Patient is also notice more aching pain the next day. Patient was the severity of 4/10.     Past medical history, social, surgical and family history all reviewed in electronic medical record.   Review of Systems: No headache, visual changes, nausea, vomiting, diarrhea, constipation, dizziness, abdominal pain, skin rash, fevers, chills, night sweats, weight loss, swollen lymph nodes, body aches, joint swelling, muscle aches, chest pain, shortness of breath, mood changes.   Objective Blood pressure 142/78, pulse 67, height 5\' 10"  (1.778 m), weight 197 lb (89.359 kg), SpO2 97.00%.  General: No apparent distress alert and oriented x3 mood and affect normal, dressed appropriately.  HEENT: Pupils equal, extraocular movements intact  Respiratory: Patient's speak in full sentences and does not appear short of breath  Cardiovascular: No lower extremity edema, non tender, no erythema  Skin: Warm dry intact with no signs of infection or rash on extremities or on axial skeleton.  Abdomen: Soft nontender  Neuro: Cranial nerves II through XII are intact, neurovascularly intact in all  extremities with 2+ DTRs and 2+ pulses.  Lymph: No lymphadenopathy of posterior or anterior cervical chain or axillae bilaterally.  Gait normal with good balance and coordination.  MSK:  Non tender with full range of motion and good stability and symmetric strength and tone of shoulders, elbows, wrist, hip, knee and ankles bilaterally.  Foot exam Exam shows the patient does have mild pes planus bilaterally but very minimal. Patient also has loss of the transverse arch bilaterally right greater than left. Patient is nontender and examined has full range of motion of the ankles bilaterally. Neurovascularly intact distally.  Procedure Patient was fitted for a : standard, cushioned, semi-rigid orthotic. The orthotic was heated and afterward the patient stood on the orthotic blank positioned on the orthotic stand. The patient was positioned in subtalar neutral position and 10 degrees of ankle dorsiflexion in a weight bearing stance. After completion of molding, a stable base was applied to the orthotic blank. The blank was ground to a stable position for weight bearing. Size: 9 Base: Blue EVA Additional Posting and Padding: None The patient ambulated these, and they were very comfortable.  I spent 60 minutes with this patient, greater than 50% was face-to-face time counseling regarding the below diagnosis.    Impression and Recommendations:

## 2014-08-10 NOTE — Assessment & Plan Note (Signed)
Patient has had bilateral metatarsalgia for quite some time as well as a history of plantar fasciitis. Patient was given orthotics today that I do think will be beneficial for him. We discussed running style as well as breaking he orthotics in slowly. We discussed an icing regimen as well as over-the-counter medications. Patient will come back and see me again in 3 weeks to make sure he is doing better with the orthotics.

## 2014-08-17 ENCOUNTER — Ambulatory Visit (INDEPENDENT_AMBULATORY_CARE_PROVIDER_SITE_OTHER): Payer: BC Managed Care – PPO | Admitting: Podiatry

## 2014-08-17 ENCOUNTER — Encounter: Payer: Self-pay | Admitting: Podiatry

## 2014-08-17 VITALS — BP 152/74 | HR 60 | Resp 12

## 2014-08-17 DIAGNOSIS — Q828 Other specified congenital malformations of skin: Secondary | ICD-10-CM | POA: Diagnosis not present

## 2014-08-17 DIAGNOSIS — E119 Type 2 diabetes mellitus without complications: Secondary | ICD-10-CM

## 2014-08-17 NOTE — Progress Notes (Signed)
   Subjective:    Patient ID: Brian Ayers, male    DOB: 1951/08/12, 63 y.o.   MRN: [id number]  HPI  NEW PROBLEM:   LT FOOT HAVE LITTLE SEEDS/CALLUSES AND BEEN HURTING 1 MONTH. THE FOOT IS BEEN THE SAME BUT HURTING WHEN WALKING. THE FOOT GET AGGRAVATED BY PRESSURE. TRIED TO TRIM AND IT HELPS.  Review of Systems     Objective:   Physical Exam        Assessment & Plan:

## 2014-08-18 NOTE — Progress Notes (Signed)
Subjective:     Patient ID: Brian Ayers, male   DOB: 09/21/1951, 63 y.o.   MRN: [id number]  HPI patient presents stating he was worried because the other doctor wanted him to use acid on the lesion on his feet and he is uncomfortable doing this with diabetes   Review of Systems     Objective:   Physical Exam Neurovascular status found to be intact with diabetes is under excellent control and has been at the current time diet-controlled. Went ahead today and noted there to be small lesions on both feet    Assessment:     Porokeratotic lesions of both feet    Plan:     Debride the lesions today and advised that this can be done on a routine basis and can't have pedicures or have his wife work on his feet in between as I do not see any disease from the diabetes      

## 2014-09-01 ENCOUNTER — Other Ambulatory Visit (INDEPENDENT_AMBULATORY_CARE_PROVIDER_SITE_OTHER): Payer: BC Managed Care – PPO

## 2014-09-01 DIAGNOSIS — E119 Type 2 diabetes mellitus without complications: Secondary | ICD-10-CM

## 2014-09-01 LAB — HEMOGLOBIN A1C: Hgb A1c MFr Bld: 6.1 % (ref 4.6–6.5)

## 2014-09-01 LAB — BASIC METABOLIC PANEL
BUN: 12 mg/dL (ref 6–23)
CO2: 28 mEq/L (ref 19–32)
Calcium: 9.3 mg/dL (ref 8.4–10.5)
Chloride: 105 mEq/L (ref 96–112)
Creatinine, Ser: 0.9 mg/dL (ref 0.4–1.5)
GFR: 87.72 mL/min (ref >60.00)
GLUCOSE: 108 mg/dL — AB (ref 70–99)
POTASSIUM: 4.4 meq/L (ref 3.5–5.1)
Sodium: 139 mEq/L (ref 135–145)

## 2014-09-09 ENCOUNTER — Encounter: Payer: Self-pay | Admitting: Internal Medicine

## 2014-09-09 ENCOUNTER — Ambulatory Visit (INDEPENDENT_AMBULATORY_CARE_PROVIDER_SITE_OTHER): Payer: BC Managed Care – PPO | Admitting: Internal Medicine

## 2014-09-09 VITALS — BP 160/92 | HR 72 | Temp 98.4°F | Resp 16 | Wt 193.0 lb

## 2014-09-09 DIAGNOSIS — E119 Type 2 diabetes mellitus without complications: Secondary | ICD-10-CM

## 2014-09-09 DIAGNOSIS — IMO0001 Reserved for inherently not codable concepts without codable children: Secondary | ICD-10-CM

## 2014-09-09 DIAGNOSIS — Z23 Encounter for immunization: Secondary | ICD-10-CM

## 2014-09-09 DIAGNOSIS — I152 Hypertension secondary to endocrine disorders: Secondary | ICD-10-CM | POA: Insufficient documentation

## 2014-09-09 DIAGNOSIS — I10 Essential (primary) hypertension: Secondary | ICD-10-CM | POA: Insufficient documentation

## 2014-09-09 DIAGNOSIS — R030 Elevated blood-pressure reading, without diagnosis of hypertension: Secondary | ICD-10-CM | POA: Insufficient documentation

## 2014-09-09 NOTE — Progress Notes (Signed)
Pre visit review using our clinic review tool, if applicable. No additional management support is needed unless otherwise documented below in the visit note. 

## 2014-09-09 NOTE — Assessment & Plan Note (Signed)
Nl BP at home - always

## 2014-09-09 NOTE — Assessment & Plan Note (Signed)
Continue with current prescription therapy as reflected on the Med list.  

## 2014-09-09 NOTE — Progress Notes (Signed)
   Subjective:    HPI  F/u DM 2 - new onset 2015 - doing well on Metformin bid  F/u occ GERD and cough - doing well F/u allergies and asthma - controlled BP is nl at home  Dr Doristine Johns, Dr Henrene Pastor Dr Nicki Reaper, DO   Wt Readings from Last 3 Encounters:  09/09/14 193 lb (87.544 kg)  08/10/14 197 lb (89.359 kg)  06/23/14 211 lb (95.709 kg)   BP Readings from Last 3 Encounters:  09/09/14 160/92  08/17/14 152/74  08/10/14 142/78      Review of Systems  Constitutional: Negative for appetite change, fatigue and unexpected weight change.  HENT: Negative for congestion, dental problem, nosebleeds, sneezing, sore throat and trouble swallowing.   Eyes: Negative for itching and visual disturbance.  Respiratory: Negative for cough.   Cardiovascular: Negative for chest pain, palpitations and leg swelling.  Gastrointestinal: Negative for nausea, diarrhea, blood in stool and abdominal distention.  Genitourinary: Negative for frequency and hematuria.  Musculoskeletal: Negative for back pain, gait problem, joint swelling and neck pain.  Skin: Negative for rash and wound.  Neurological: Negative for dizziness, tremors, speech difficulty and weakness.  Psychiatric/Behavioral: Negative for suicidal ideas, sleep disturbance, dysphoric mood and agitation. The patient is not nervous/anxious.        Objective:   Physical Exam  Constitutional: He is oriented to person, place, and time. He appears well-developed. No distress.  NAD  HENT:  Mouth/Throat: Oropharynx is clear and moist.  Eyes: Conjunctivae are normal. Pupils are equal, round, and reactive to light.  Neck: Normal range of motion. No JVD present. No thyromegaly present.  Cardiovascular: Normal rate, regular rhythm, normal heart sounds and intact distal pulses.  Exam reveals no gallop and no friction rub.   No murmur heard. Pulmonary/Chest: Effort normal and breath sounds normal. No respiratory distress. He has no wheezes. He has no  rales. He exhibits no tenderness.  Abdominal: Soft. Bowel sounds are normal. He exhibits no distension and no mass. There is no tenderness. There is no rebound and no guarding.  Musculoskeletal: Normal range of motion. He exhibits no edema and no tenderness.  Lymphadenopathy:    He has no cervical adenopathy.  Neurological: He is alert and oriented to person, place, and time. He has normal reflexes. No cranial nerve deficit. He exhibits normal muscle tone. He displays a negative Romberg sign. Coordination and gait normal.  Skin: Skin is warm and dry. No rash noted.  Psychiatric: He has a normal mood and affect. His behavior is normal. Judgment and thought content normal.   Lab Results  Component Value Date   WBC 8.2 05/26/2014   HGB 14.9 05/26/2014   HCT 43.7 05/26/2014   PLT 229.0 05/26/2014   GLUCOSE 108* 09/01/2014   CHOL 177 05/26/2014   TRIG 118.0 05/26/2014   HDL 51.80 05/26/2014   LDLCALC 102* 05/26/2014   ALT 41 05/26/2014   AST 29 05/26/2014   NA 139 09/01/2014   K 4.4 09/01/2014   CL 105 09/01/2014   CREATININE 0.9 09/01/2014   BUN 12 09/01/2014   CO2 28 09/01/2014   TSH 2.50 05/26/2014   PSA 0.82 05/26/2014   HGBA1C 6.1 09/01/2014          Assessment & Plan:

## 2014-09-21 ENCOUNTER — Other Ambulatory Visit: Payer: Self-pay | Admitting: Internal Medicine

## 2014-09-21 MED ORDER — ROSUVASTATIN CALCIUM 10 MG PO TABS: 10.0000 mg | ORAL_TABLET | ORAL | Status: DC

## 2014-10-05 ENCOUNTER — Encounter: Payer: Self-pay | Admitting: Internal Medicine

## 2014-11-24 ENCOUNTER — Ambulatory Visit: Payer: BC Managed Care – PPO | Admitting: Internal Medicine

## 2014-12-24 ENCOUNTER — Other Ambulatory Visit: Payer: Self-pay | Admitting: Dermatology

## 2015-01-06 ENCOUNTER — Other Ambulatory Visit (INDEPENDENT_AMBULATORY_CARE_PROVIDER_SITE_OTHER): Payer: Self-pay

## 2015-01-06 DIAGNOSIS — E1165 Type 2 diabetes mellitus with hyperglycemia: Secondary | ICD-10-CM

## 2015-01-06 DIAGNOSIS — IMO0002 Reserved for concepts with insufficient information to code with codable children: Secondary | ICD-10-CM

## 2015-01-06 LAB — BASIC METABOLIC PANEL
BUN: 14 mg/dL (ref 6–23)
CHLORIDE: 104 meq/L (ref 96–112)
CO2: 28 meq/L (ref 19–32)
Calcium: 9.5 mg/dL (ref 8.4–10.5)
Creatinine, Ser: 0.95 mg/dL (ref 0.40–1.50)
GFR: 85.49 mL/min (ref >60.00)
Glucose, Bld: 117 mg/dL — ABNORMAL HIGH (ref 70–99)
POTASSIUM: 4.2 meq/L (ref 3.5–5.1)
Sodium: 138 mEq/L (ref 135–145)

## 2015-01-06 LAB — HEMOGLOBIN A1C: Hgb A1c MFr Bld: 6.1 % (ref 4.6–6.5)

## 2015-01-11 ENCOUNTER — Encounter: Payer: Self-pay | Admitting: Internal Medicine

## 2015-01-12 ENCOUNTER — Encounter: Payer: Self-pay | Admitting: Internal Medicine

## 2015-01-12 ENCOUNTER — Ambulatory Visit (INDEPENDENT_AMBULATORY_CARE_PROVIDER_SITE_OTHER): Payer: BLUE CROSS/BLUE SHIELD | Admitting: Internal Medicine

## 2015-01-12 VITALS — BP 152/90 | HR 67 | Temp 97.8°F | Wt 187.0 lb

## 2015-01-12 DIAGNOSIS — E785 Hyperlipidemia, unspecified: Secondary | ICD-10-CM

## 2015-01-12 DIAGNOSIS — M6530 Trigger finger, unspecified finger: Secondary | ICD-10-CM

## 2015-01-12 DIAGNOSIS — E119 Type 2 diabetes mellitus without complications: Secondary | ICD-10-CM

## 2015-01-12 DIAGNOSIS — K219 Gastro-esophageal reflux disease without esophagitis: Secondary | ICD-10-CM

## 2015-01-12 NOTE — Progress Notes (Signed)
° ° °  Subjective:    HPI  F/u DM 2 - new onset 2015 - doing well on Metformin bid  F/u occ GERD and cough - doing well F/u allergies and asthma - controlled BP is nl at home  Dr Doristine Johns, Dr Henrene Pastor Dr Nicki Reaper, DO   Wt Readings from Last 3 Encounters:  01/12/15 187 lb (84.823 kg)  09/09/14 193 lb (87.544 kg)  08/10/14 197 lb (89.359 kg)   BP Readings from Last 3 Encounters:  01/12/15 152/90  09/09/14 160/92  08/17/14 152/74      Review of Systems  Constitutional: Negative for appetite change, fatigue and unexpected weight change.  HENT: Negative for congestion, dental problem, nosebleeds, sneezing, sore throat and trouble swallowing.   Eyes: Negative for itching and visual disturbance.  Respiratory: Negative for cough.   Cardiovascular: Negative for chest pain, palpitations and leg swelling.  Gastrointestinal: Negative for nausea, diarrhea, blood in stool and abdominal distention.  Genitourinary: Negative for frequency and hematuria.  Musculoskeletal: Negative for back pain, gait problem, joint swelling and neck pain.  Skin: Negative for rash and wound.  Neurological: Negative for dizziness, tremors, speech difficulty and weakness.  Psychiatric/Behavioral: Negative for suicidal ideas, sleep disturbance, dysphoric mood and agitation. The patient is not nervous/anxious.        Objective:   Physical Exam  Constitutional: He is oriented to person, place, and time. He appears well-developed. No distress.  NAD  HENT:  Mouth/Throat: Oropharynx is clear and moist.  Eyes: Conjunctivae are normal. Pupils are equal, round, and reactive to light.  Neck: Normal range of motion. No JVD present. No thyromegaly present.  Cardiovascular: Normal rate, regular rhythm, normal heart sounds and intact distal pulses.  Exam reveals no gallop and no friction rub.   No murmur heard. Pulmonary/Chest: Effort normal and breath sounds normal. No respiratory distress. He has no wheezes. He has no  rales. He exhibits no tenderness.  Abdominal: Soft. Bowel sounds are normal. He exhibits no distension and no mass. There is no tenderness. There is no rebound and no guarding.  Musculoskeletal: Normal range of motion. He exhibits no edema and no tenderness.  Lymphadenopathy:    He has no cervical adenopathy.  Neurological: He is alert and oriented to person, place, and time. He has normal reflexes. No cranial nerve deficit. He exhibits normal muscle tone. He displays a negative Romberg sign. Coordination and gait normal.  Skin: Skin is warm and dry. No rash noted.  Psychiatric: He has a normal mood and affect. His behavior is normal. Judgment and thought content normal.   Lab Results  Component Value Date   WBC 8.2 05/26/2014   HGB 14.9 05/26/2014   HCT 43.7 05/26/2014   PLT 229.0 05/26/2014   GLUCOSE 117* 01/06/2015   CHOL 177 05/26/2014   TRIG 118.0 05/26/2014   HDL 51.80 05/26/2014   LDLCALC 102* 05/26/2014   ALT 41 05/26/2014   AST 29 05/26/2014   NA 138 01/06/2015   K 4.2 01/06/2015   CL 104 01/06/2015   CREATININE 0.95 01/06/2015   BUN 14 01/06/2015   CO2 28 01/06/2015   TSH 2.50 05/26/2014   PSA 0.82 05/26/2014   HGBA1C 6.1 01/06/2015   Form filled out     Assessment & Plan:

## 2015-01-12 NOTE — Assessment & Plan Note (Signed)
Continue with current prescription therapy as reflected on the Med list.  

## 2015-01-12 NOTE — Assessment & Plan Note (Signed)
Inj offered

## 2015-01-12 NOTE — Assessment & Plan Note (Signed)
Start Zantac

## 2015-01-26 ENCOUNTER — Ambulatory Visit: Payer: BC Managed Care – PPO

## 2015-02-19 ENCOUNTER — Other Ambulatory Visit: Payer: Self-pay | Admitting: *Deleted

## 2015-02-19 MED ORDER — RANITIDINE HCL 150 MG PO TABS: 150.0000 mg | ORAL_TABLET | Freq: Two times a day (BID) | ORAL | Status: DC

## 2015-02-19 MED ORDER — LORATADINE 10 MG PO TABS: 10.0000 mg | ORAL_TABLET | Freq: Every day | ORAL | Status: DC

## 2015-03-17 ENCOUNTER — Encounter: Payer: Self-pay | Admitting: Internal Medicine

## 2015-03-17 ENCOUNTER — Other Ambulatory Visit: Payer: Self-pay | Admitting: Internal Medicine

## 2015-03-17 MED ORDER — BENZONATATE 200 MG PO CAPS: 200.0000 mg | ORAL_CAPSULE | Freq: Three times a day (TID) | ORAL | Status: DC | PRN

## 2015-03-31 ENCOUNTER — Encounter: Payer: Self-pay | Admitting: Internal Medicine

## 2015-03-31 ENCOUNTER — Other Ambulatory Visit: Payer: Self-pay | Admitting: Dermatology

## 2015-03-31 ENCOUNTER — Ambulatory Visit (INDEPENDENT_AMBULATORY_CARE_PROVIDER_SITE_OTHER): Payer: BLUE CROSS/BLUE SHIELD | Admitting: Internal Medicine

## 2015-03-31 VITALS — BP 136/78 | HR 58 | Temp 98.0°F | Resp 16 | Wt 188.0 lb

## 2015-03-31 DIAGNOSIS — E119 Type 2 diabetes mellitus without complications: Secondary | ICD-10-CM

## 2015-03-31 DIAGNOSIS — M6530 Trigger finger, unspecified finger: Secondary | ICD-10-CM

## 2015-03-31 MED ORDER — METHYLPREDNISOLONE ACETATE 40 MG/ML IJ SUSP
10.0000 mg | Freq: Once | INTRAMUSCULAR | Status: AC
  Administered 2015-03-31: 10 mg via INTRA_ARTICULAR

## 2015-03-31 NOTE — Progress Notes (Signed)
Subjective:    HPI  F/u DM 2 - new onset 2015 - doing well on Metformin bid  F/u occ GERD and cough - doing well F/u allergies and asthma - controlled BP is nl at home  Dr Doristine Johns, Dr Henrene Pastor Dr Nicki Reaper, DO   Wt Readings from Last 3 Encounters:  03/31/15 188 lb (85.276 kg)  01/12/15 187 lb (84.823 kg)  09/09/14 193 lb (87.544 kg)   BP Readings from Last 3 Encounters:  03/31/15 136/78  01/12/15 152/90  09/09/14 160/92      Review of Systems  Constitutional: Negative for appetite change, fatigue and unexpected weight change.  HENT: Negative for congestion, dental problem, nosebleeds, sneezing, sore throat and trouble swallowing.   Eyes: Negative for itching and visual disturbance.  Respiratory: Negative for cough.   Cardiovascular: Negative for chest pain, palpitations and leg swelling.  Gastrointestinal: Negative for nausea, diarrhea, blood in stool and abdominal distention.  Genitourinary: Negative for frequency and hematuria.  Musculoskeletal: Negative for back pain, joint swelling, gait problem and neck pain.  Skin: Negative for rash and wound.  Neurological: Negative for dizziness, tremors, speech difficulty and weakness.  Psychiatric/Behavioral: Negative for suicidal ideas, sleep disturbance, dysphoric mood and agitation. The patient is not nervous/anxious.        Objective:   Physical Exam  Constitutional: He is oriented to person, place, and time. He appears well-developed. No distress.  NAD  HENT:  Mouth/Throat: Oropharynx is clear and moist.  Eyes: Conjunctivae are normal. Pupils are equal, round, and reactive to light.  Neck: Normal range of motion. No JVD present. No thyromegaly present.  Cardiovascular: Normal rate, regular rhythm, normal heart sounds and intact distal pulses.  Exam reveals no gallop and no friction rub.   No murmur heard. Pulmonary/Chest: Effort normal and breath sounds normal. No respiratory distress. He has no wheezes. He has no  rales. He exhibits no tenderness.  Abdominal: Soft. Bowel sounds are normal. He exhibits no distension and no mass. There is no tenderness. There is no rebound and no guarding.  Musculoskeletal: Normal range of motion. He exhibits no edema or tenderness.  Lymphadenopathy:    He has no cervical adenopathy.  Neurological: He is alert and oriented to person, place, and time. He has normal reflexes. No cranial nerve deficit. He exhibits normal muscle tone. He displays a negative Romberg sign. Coordination and gait normal.  Skin: Skin is warm and dry. No rash noted.  Psychiatric: He has a normal mood and affect. His behavior is normal. Judgment and thought content normal.  L 3d finger - triggering w/pain Lab Results  Component Value Date   WBC 8.2 05/26/2014   HGB 14.9 05/26/2014   HCT 43.7 05/26/2014   PLT 229.0 05/26/2014   GLUCOSE 117* 01/06/2015   CHOL 177 05/26/2014   TRIG 118.0 05/26/2014   HDL 51.80 05/26/2014   LDLCALC 102* 05/26/2014   ALT 41 05/26/2014   AST 29 05/26/2014   NA 138 01/06/2015   K 4.2 01/06/2015   CL 104 01/06/2015   CREATININE 0.95 01/06/2015   BUN 14 01/06/2015   CO2 28 01/06/2015   TSH 2.50 05/26/2014   PSA 0.82 05/26/2014   HGBA1C 6.1 01/06/2015      Procedure Note :    Trigger finger tendon Injection:   Indication : Trigger finger L 3d   Risks including unsuccessful procedure , bleeding, infection, bruising, skin atrophy and others were explained to the patient in detail as well as the benefits.  Informed consent was obtained and signed.   Tthe patient was placed in a comfortable position.    Flexor digitorum tendon was marked and  the skin was prepped with Betadine and alcohol. 1 inch 25-gauge needle was used. The needle was advanced  into the skin down to tendon. It  was injected with 0.5 mL of 2% lidocaine and 10 mg of Depo-Medrol in a usual fashion.  Band-Aids applied.   Tolerated well. Complications: None. Good pain relief following the  procedure.      Assessment & Plan:  Patient ID: Brian Ayers, male   DOB: May 29, 1951, 64 y.o.   MRN: [id number]

## 2015-03-31 NOTE — Assessment & Plan Note (Signed)
Will inject - see procedure 

## 2015-03-31 NOTE — Progress Notes (Signed)
Pre visit review using our clinic review tool, if applicable. No additional management support is needed unless otherwise documented below in the visit note. 

## 2015-04-01 NOTE — Assessment & Plan Note (Signed)
On Metformin 

## 2015-04-05 ENCOUNTER — Ambulatory Visit (INDEPENDENT_AMBULATORY_CARE_PROVIDER_SITE_OTHER): Payer: BLUE CROSS/BLUE SHIELD | Admitting: Podiatry

## 2015-04-05 DIAGNOSIS — E119 Type 2 diabetes mellitus without complications: Secondary | ICD-10-CM

## 2015-04-05 DIAGNOSIS — Q828 Other specified congenital malformations of skin: Secondary | ICD-10-CM | POA: Diagnosis not present

## 2015-04-05 NOTE — Progress Notes (Signed)
Subjective:     Patient ID: Brian Ayers, male   DOB: May 03, 1951, 64 y.o.   MRN: [id number]  HPI patient presents stating he was worried because the other doctor wanted him to use acid on the lesion on his feet and he is uncomfortable doing this with diabetes   Review of Systems     Objective:   Physical Exam Neurovascular status found to be intact with diabetes is under excellent control and has been at the current time diet-controlled. Went ahead today and noted there to be small lesions on both feet    Assessment:     Porokeratotic lesions of both feet    Plan:     Debride the lesions today and advised that this can be done on a routine basis and can't have pedicures or have his wife work on his feet in between as I do not see any disease from the diabetes

## 2015-04-18 LAB — HM DIABETES FOOT EXAM

## 2015-04-20 ENCOUNTER — Other Ambulatory Visit (INDEPENDENT_AMBULATORY_CARE_PROVIDER_SITE_OTHER): Payer: BLUE CROSS/BLUE SHIELD

## 2015-04-20 DIAGNOSIS — K219 Gastro-esophageal reflux disease without esophagitis: Secondary | ICD-10-CM | POA: Diagnosis not present

## 2015-04-20 DIAGNOSIS — M6530 Trigger finger, unspecified finger: Secondary | ICD-10-CM

## 2015-04-20 DIAGNOSIS — E785 Hyperlipidemia, unspecified: Secondary | ICD-10-CM

## 2015-04-20 DIAGNOSIS — E119 Type 2 diabetes mellitus without complications: Secondary | ICD-10-CM

## 2015-04-20 LAB — BASIC METABOLIC PANEL
BUN: 16 mg/dL (ref 6–23)
CHLORIDE: 107 meq/L (ref 96–112)
CO2: 28 mEq/L (ref 19–32)
Calcium: 9.3 mg/dL (ref 8.4–10.5)
Creatinine, Ser: 0.9 mg/dL (ref 0.40–1.50)
GFR: 90.91 mL/min (ref >60.00)
Glucose, Bld: 107 mg/dL — ABNORMAL HIGH (ref 70–99)
Potassium: 4.3 mEq/L (ref 3.5–5.1)
Sodium: 140 mEq/L (ref 135–145)

## 2015-04-20 LAB — HEMOGLOBIN A1C: HEMOGLOBIN A1C: 5.9 % (ref 4.6–6.5)

## 2015-04-20 LAB — LIPID PANEL
Cholesterol: 163 mg/dL (ref 0–200)
HDL: 50.1 mg/dL (ref >39.00)
LDL CALC: 96 mg/dL (ref 0–99)
NonHDL: 112.9
Total CHOL/HDL Ratio: 3
Triglycerides: 87 mg/dL (ref 0.0–149.0)
VLDL: 17.4 mg/dL (ref 0.0–40.0)

## 2015-05-15 ENCOUNTER — Other Ambulatory Visit: Payer: Self-pay | Admitting: Internal Medicine

## 2015-05-18 ENCOUNTER — Ambulatory Visit: Payer: BLUE CROSS/BLUE SHIELD | Admitting: Internal Medicine

## 2015-05-27 ENCOUNTER — Ambulatory Visit (INDEPENDENT_AMBULATORY_CARE_PROVIDER_SITE_OTHER): Payer: BLUE CROSS/BLUE SHIELD | Admitting: Internal Medicine

## 2015-05-27 ENCOUNTER — Encounter: Payer: Self-pay | Admitting: Internal Medicine

## 2015-05-27 VITALS — BP 140/90 | HR 71 | Ht 70.0 in | Wt 189.0 lb

## 2015-05-27 DIAGNOSIS — K219 Gastro-esophageal reflux disease without esophagitis: Secondary | ICD-10-CM | POA: Diagnosis not present

## 2015-05-27 DIAGNOSIS — E785 Hyperlipidemia, unspecified: Secondary | ICD-10-CM

## 2015-05-27 DIAGNOSIS — E119 Type 2 diabetes mellitus without complications: Secondary | ICD-10-CM

## 2015-05-27 DIAGNOSIS — Z0000 Encounter for general adult medical examination without abnormal findings: Secondary | ICD-10-CM

## 2015-05-27 DIAGNOSIS — R030 Elevated blood-pressure reading, without diagnosis of hypertension: Secondary | ICD-10-CM

## 2015-05-27 DIAGNOSIS — IMO0001 Reserved for inherently not codable concepts without codable children: Secondary | ICD-10-CM

## 2015-05-27 NOTE — Progress Notes (Signed)
Pre visit review using our clinic review tool, if applicable. No additional management support is needed unless otherwise documented below in the visit note. 

## 2015-05-27 NOTE — Assessment & Plan Note (Signed)
On Ranitidine

## 2015-05-27 NOTE — Assessment & Plan Note (Signed)
On Crestor 

## 2015-05-27 NOTE — Progress Notes (Signed)
   Subjective:    HPI  The patient is here for a wellness exam. The patient has been doing well overall without major physical or psychological issues going on lately. C/o occ GERD and cough C/o allergies and asthma.  Nl BP at home - always Dr Doristine Johns, Dr Henrene Pastor, Dr Katy Fitch, Dr Paulla Dolly Dr Nicki Reaper, DO   Wt Readings from Last 3 Encounters:  05/27/15 189 lb (85.73 kg)  03/31/15 188 lb (85.276 kg)  01/12/15 187 lb (84.823 kg)   BP Readings from Last 3 Encounters:  05/27/15 140/90  03/31/15 136/78  01/12/15 152/90      Review of Systems  Constitutional: Negative for appetite change, fatigue and unexpected weight change.  HENT: Negative for congestion, dental problem, nosebleeds, sneezing, sore throat and trouble swallowing.   Eyes: Negative for itching and visual disturbance.  Respiratory: Negative for cough.   Cardiovascular: Negative for chest pain, palpitations and leg swelling.  Gastrointestinal: Negative for nausea, diarrhea, blood in stool and abdominal distention.  Genitourinary: Negative for frequency and hematuria.  Musculoskeletal: Negative for back pain, joint swelling, gait problem and neck pain.  Skin: Negative for rash and wound.  Neurological: Negative for dizziness, tremors, speech difficulty and weakness.  Psychiatric/Behavioral: Negative for suicidal ideas, sleep disturbance, dysphoric mood and agitation. The patient is not nervous/anxious.        Objective:   Physical Exam  Constitutional: He is oriented to person, place, and time. He appears well-developed. No distress.  NAD  HENT:  Mouth/Throat: Oropharynx is clear and moist.  Eyes: Conjunctivae are normal. Pupils are equal, round, and reactive to light.  Neck: Normal range of motion. No JVD present. No thyromegaly present.  Cardiovascular: Normal rate, regular rhythm, normal heart sounds and intact distal pulses.  Exam reveals no gallop and no friction rub.   No murmur heard. Pulmonary/Chest: Effort normal  and breath sounds normal. No respiratory distress. He has no wheezes. He has no rales. He exhibits no tenderness.  Abdominal: Soft. Bowel sounds are normal. He exhibits no distension and no mass. There is no tenderness. There is no rebound and no guarding.  Genitourinary: Guaiac negative stool.  Prostate 1+ NT  Musculoskeletal: Normal range of motion. He exhibits no edema or tenderness.  Lymphadenopathy:    He has no cervical adenopathy.  Neurological: He is alert and oriented to person, place, and time. He has normal reflexes. No cranial nerve deficit. He exhibits normal muscle tone. He displays a negative Romberg sign. Coordination and gait normal.  Skin: Skin is warm and dry. No rash noted.  Psychiatric: He has a normal mood and affect. His behavior is normal. Judgment and thought content normal.   Lab Results  Component Value Date   WBC 8.2 05/26/2014   HGB 14.9 05/26/2014   HCT 43.7 05/26/2014   PLT 229.0 05/26/2014   GLUCOSE 107* 04/20/2015   CHOL 163 04/20/2015   TRIG 87.0 04/20/2015   HDL 50.10 04/20/2015   LDLCALC 96 04/20/2015   ALT 41 05/26/2014   AST 29 05/26/2014   NA 140 04/20/2015   K 4.3 04/20/2015   CL 107 04/20/2015   CREATININE 0.90 04/20/2015   BUN 16 04/20/2015   CO2 28 04/20/2015   TSH 2.50 05/26/2014   PSA 0.82 05/26/2014   HGBA1C 5.9 04/20/2015          Assessment & Plan:

## 2015-05-27 NOTE — Patient Instructions (Signed)
Melatonin for jet lag Saw Palmetto for prostate

## 2015-05-27 NOTE — Assessment & Plan Note (Signed)
We discussed age appropriate health related issues, including available/recomended screening tests and vaccinations. We discussed a need for adhering to healthy diet and exercise. Labs/EKG were reviewed/ordered. All questions were answered.   

## 2015-05-27 NOTE — Assessment & Plan Note (Signed)
On Metformin 

## 2015-05-27 NOTE — Assessment & Plan Note (Signed)
Nl BP at home - always

## 2015-06-14 ENCOUNTER — Other Ambulatory Visit: Payer: Self-pay

## 2015-06-16 ENCOUNTER — Other Ambulatory Visit: Payer: Self-pay | Admitting: Internal Medicine

## 2015-06-28 ENCOUNTER — Other Ambulatory Visit (INDEPENDENT_AMBULATORY_CARE_PROVIDER_SITE_OTHER): Payer: BLUE CROSS/BLUE SHIELD

## 2015-06-28 ENCOUNTER — Encounter: Payer: Self-pay | Admitting: Internal Medicine

## 2015-06-28 DIAGNOSIS — E119 Type 2 diabetes mellitus without complications: Secondary | ICD-10-CM

## 2015-06-28 DIAGNOSIS — Z0000 Encounter for general adult medical examination without abnormal findings: Secondary | ICD-10-CM

## 2015-06-28 LAB — CBC WITH DIFFERENTIAL/PLATELET
Basophils Absolute: 0 10*3/uL (ref 0.0–0.1)
Basophils Relative: 0.6 % (ref 0.0–3.0)
EOS ABS: 0.5 10*3/uL (ref 0.0–0.7)
Eosinophils Relative: 8.7 % — ABNORMAL HIGH (ref 0.0–5.0)
HCT: 43.3 % (ref 39.0–52.0)
HEMOGLOBIN: 14.9 g/dL (ref 13.0–17.0)
Lymphocytes Relative: 42.5 % (ref 12.0–46.0)
Lymphs Abs: 2.6 10*3/uL (ref 0.7–4.0)
MCHC: 34.4 g/dL (ref 30.0–36.0)
MCV: 93.4 fl (ref 78.0–100.0)
MONO ABS: 0.6 10*3/uL (ref 0.1–1.0)
Monocytes Relative: 9.6 % (ref 3.0–12.0)
Neutro Abs: 2.3 10*3/uL (ref 1.4–7.7)
Neutrophils Relative %: 38.6 % — ABNORMAL LOW (ref 43.0–77.0)
PLATELETS: 235 10*3/uL (ref 150.0–400.0)
RBC: 4.64 Mil/uL (ref 4.22–5.81)
RDW: 12.1 % (ref 11.5–15.5)
WBC: 6.1 10*3/uL (ref 4.0–10.5)

## 2015-06-28 LAB — URINALYSIS
Bilirubin Urine: NEGATIVE
Hgb urine dipstick: NEGATIVE
Ketones, ur: NEGATIVE
Leukocytes, UA: NEGATIVE
Nitrite: NEGATIVE
Specific Gravity, Urine: 1.02 (ref 1.000–1.030)
Total Protein, Urine: NEGATIVE
Urine Glucose: NEGATIVE
Urobilinogen, UA: 0.2 (ref 0.0–1.0)
pH: 6 (ref 5.0–8.0)

## 2015-06-28 LAB — BASIC METABOLIC PANEL
BUN: 16 mg/dL (ref 6–23)
CALCIUM: 9.5 mg/dL (ref 8.4–10.5)
CO2: 29 mEq/L (ref 19–32)
CREATININE: 0.93 mg/dL (ref 0.40–1.50)
Chloride: 104 mEq/L (ref 96–112)
GFR: 87.48 mL/min (ref >60.00)
Glucose, Bld: 111 mg/dL — ABNORMAL HIGH (ref 70–99)
Potassium: 4.6 mEq/L (ref 3.5–5.1)
SODIUM: 141 meq/L (ref 135–145)

## 2015-06-28 LAB — LIPID PANEL
CHOL/HDL RATIO: 4
Cholesterol: 178 mg/dL (ref 0–200)
HDL: 50.7 mg/dL (ref >39.00)
LDL Cholesterol: 106 mg/dL — ABNORMAL HIGH (ref 0–99)
NONHDL: 127.3
TRIGLYCERIDES: 107 mg/dL (ref 0.0–149.0)
VLDL: 21.4 mg/dL (ref 0.0–40.0)

## 2015-06-28 LAB — HEPATIC FUNCTION PANEL
ALBUMIN: 4.4 g/dL (ref 3.5–5.2)
ALT: 22 U/L (ref 0–53)
AST: 20 U/L (ref 0–37)
Alkaline Phosphatase: 58 U/L (ref 39–117)
BILIRUBIN TOTAL: 0.9 mg/dL (ref 0.2–1.2)
Bilirubin, Direct: 0.2 mg/dL (ref 0.0–0.3)
Total Protein: 6.7 g/dL (ref 6.0–8.3)

## 2015-06-28 LAB — TSH: TSH: 2.37 u[IU]/mL (ref 0.35–4.50)

## 2015-06-28 LAB — PSA: PSA: 0.82 ng/mL (ref 0.10–4.00)

## 2015-06-28 LAB — HEMOGLOBIN A1C: Hgb A1c MFr Bld: 5.9 % (ref 4.6–6.5)

## 2015-06-29 ENCOUNTER — Other Ambulatory Visit: Payer: BLUE CROSS/BLUE SHIELD

## 2015-06-29 ENCOUNTER — Ambulatory Visit (INDEPENDENT_AMBULATORY_CARE_PROVIDER_SITE_OTHER): Payer: BLUE CROSS/BLUE SHIELD | Admitting: Internal Medicine

## 2015-06-29 ENCOUNTER — Encounter: Payer: Self-pay | Admitting: Internal Medicine

## 2015-06-29 VITALS — BP 160/98 | HR 72 | Wt 195.0 lb

## 2015-06-29 DIAGNOSIS — E785 Hyperlipidemia, unspecified: Secondary | ICD-10-CM

## 2015-06-29 DIAGNOSIS — E119 Type 2 diabetes mellitus without complications: Secondary | ICD-10-CM | POA: Diagnosis not present

## 2015-06-29 DIAGNOSIS — K219 Gastro-esophageal reflux disease without esophagitis: Secondary | ICD-10-CM

## 2015-06-29 DIAGNOSIS — R030 Elevated blood-pressure reading, without diagnosis of hypertension: Secondary | ICD-10-CM

## 2015-06-29 DIAGNOSIS — Z23 Encounter for immunization: Secondary | ICD-10-CM

## 2015-06-29 LAB — MICROALBUMIN / CREATININE URINE RATIO
CREATININE, U: 130.8 mg/dL
Microalb Creat Ratio: 0.5 mg/g (ref 0.0–30.0)
Microalb, Ur: <0.7 mg/dL (ref 0.0–1.9)

## 2015-06-29 NOTE — Assessment & Plan Note (Signed)
On Crestor 

## 2015-06-29 NOTE — Progress Notes (Signed)
Pre visit review using our clinic review tool, if applicable. No additional management support is needed unless otherwise documented below in the visit note. 

## 2015-06-29 NOTE — Assessment & Plan Note (Signed)
On Metformin 

## 2015-06-29 NOTE — Progress Notes (Signed)
Subjective:  Patient ID: Brian Ayers, male    DOB: 26-Oct-1951  Age: 64 y.o. MRN: [id number]  CC: No chief complaint on file.   HPI TEOFILO LUPINACCI presents for HTN, DM, dyslipidemia. BP nl at home  Outpatient Prescriptions Prior to Visit  Medication Sig Dispense Refill  . aspirin 81 MG EC tablet Take 81 mg by mouth daily.      . benzonatate (TESSALON) 200 MG capsule TAKE 1 CAPSULE (200 MG TOTAL) BY MOUTH 3 (THREE) TIMES DAILY AS NEEDED FOR COUGH. 60 capsule 0  . Biotin 2500 MCG CAPS Take by mouth.      . DYMISTA 137-50 MCG/ACT SUSP     . Fluticasone-Salmeterol (ADVAIR) 100-50 MCG/DOSE AEPB Inhale 1 puff into the lungs 2 (two) times daily.    Marland Kitchen FREESTYLE UNISTICK II LANCETS MISC 1 each by Does not apply route 2 (two) times daily. 100 each 5  . glucose blood (FREESTYLE INSULINX TEST) test strip Use two times daily as instructed. Dx: 250.02 100 each 5  . levocetirizine (XYZAL) 5 MG tablet as needed.  3  . loratadine (CLARITIN) 10 MG tablet Take 1 tablet (10 mg total) by mouth daily. 100 tablet 3  . metFORMIN (GLUCOPHAGE) 500 MG tablet TAKE 1 TABLET BY MOUTH TWICE A DAY WITH MEALS 60 tablet 11  . PROAIR HFA 108 (90 BASE) MCG/ACT inhaler as needed.  0  . ranitidine (ZANTAC) 150 MG tablet Take 1 tablet (150 mg total) by mouth 2 (two) times daily. 180 tablet 3  . rosuvastatin (CRESTOR) 10 MG tablet Take 1 tablet (10 mg total) by mouth every other day. 30 tablet 5  . UNABLE TO FIND Med Name: Takes allergy shots weekly     No facility-administered medications prior to visit.    ROS Review of Systems  Constitutional: Negative for appetite change, fatigue and unexpected weight change.  HENT: Negative for congestion, nosebleeds, sneezing, sore throat and trouble swallowing.   Eyes: Negative for itching and visual disturbance.  Respiratory: Negative for cough.   Cardiovascular: Negative for chest pain, palpitations and leg swelling.  Gastrointestinal: Negative for nausea, diarrhea, blood  in stool and abdominal distention.  Genitourinary: Negative for frequency and hematuria.  Musculoskeletal: Negative for back pain, joint swelling, gait problem and neck pain.  Skin: Negative for rash.  Neurological: Negative for dizziness, tremors, speech difficulty and weakness.  Psychiatric/Behavioral: Negative for suicidal ideas, sleep disturbance, dysphoric mood and agitation. The patient is not nervous/anxious.     Objective:  BP 160/98 mmHg  Pulse 72  Wt 195 lb (88.451 kg)  SpO2 97%  BP Readings from Last 3 Encounters:  06/29/15 160/98  05/27/15 140/90  03/31/15 136/78    Wt Readings from Last 3 Encounters:  06/29/15 195 lb (88.451 kg)  05/27/15 189 lb (85.73 kg)  03/31/15 188 lb (85.276 kg)    Physical Exam  Constitutional: He is oriented to person, place, and time. He appears well-developed. No distress.  NAD  HENT:  Mouth/Throat: Oropharynx is clear and moist.  Eyes: Conjunctivae are normal. Pupils are equal, round, and reactive to light.  Neck: Normal range of motion. No JVD present. No thyromegaly present.  Cardiovascular: Normal rate, regular rhythm, normal heart sounds and intact distal pulses.  Exam reveals no gallop and no friction rub.   No murmur heard. Pulmonary/Chest: Effort normal and breath sounds normal. No respiratory distress. He has no wheezes. He has no rales. He exhibits no tenderness.  Abdominal: Soft. Bowel sounds are  normal. He exhibits no distension and no mass. There is no tenderness. There is no rebound and no guarding.  Musculoskeletal: Normal range of motion. He exhibits no edema or tenderness.  Lymphadenopathy:    He has no cervical adenopathy.  Neurological: He is alert and oriented to person, place, and time. He has normal reflexes. No cranial nerve deficit. He exhibits normal muscle tone. He displays a negative Romberg sign. Coordination and gait normal.  Skin: Skin is warm and dry. No rash noted.  Psychiatric: He has a normal mood and  affect. His behavior is normal. Judgment and thought content normal.    Lab Results  Component Value Date   WBC 6.1 06/28/2015   HGB 14.9 06/28/2015   HCT 43.3 06/28/2015   PLT 235.0 06/28/2015   GLUCOSE 111* 06/28/2015   CHOL 178 06/28/2015   TRIG 107.0 06/28/2015   HDL 50.70 06/28/2015   LDLCALC 106* 06/28/2015   ALT 22 06/28/2015   AST 20 06/28/2015   NA 141 06/28/2015   K 4.6 06/28/2015   CL 104 06/28/2015   CREATININE 0.93 06/28/2015   BUN 16 06/28/2015   CO2 29 06/28/2015   TSH 2.37 06/28/2015   PSA 0.82 06/28/2015   HGBA1C 5.9 06/28/2015   MICROALBUR <0.7 06/29/2015    No results found.  Assessment & Plan:   Diagnoses and all orders for this visit:  Diabetes type 2, controlled Orders: -     Microalbumin / creatinine urine ratio; Future  Elevated blood pressure reading without diagnosis of hypertension  Gastroesophageal reflux disease without esophagitis  Dyslipidemia  Need for prophylactic vaccination against Streptococcus pneumoniae (pneumococcus) Orders: -     Pneumococcal conjugate vaccine 13-valent   I am having Mr. Bracken maintain his aspirin, Biotin, DYMISTA, UNABLE TO FIND, Fluticasone-Salmeterol, glucose blood, FREESTYLE UNISTICK II LANCETS, rosuvastatin, loratadine, ranitidine, metFORMIN, PROAIR HFA, levocetirizine, and benzonatate.  No orders of the defined types were placed in this encounter.     Follow-up: Return in about 4 months (around 10/30/2015) for a follow-up visit.  Walker Kehr, MD

## 2015-06-29 NOTE — Assessment & Plan Note (Signed)
6/15 worse - d/c MVI, supplements On Ranitidine bid

## 2015-06-29 NOTE — Assessment & Plan Note (Signed)
Nl BP at home 

## 2015-07-02 ENCOUNTER — Telehealth: Payer: Self-pay | Admitting: Internal Medicine

## 2015-07-02 NOTE — Telephone Encounter (Signed)
Talked with patients wife, gave numbers on labs---she hasnt tried looking back in my chart today to see if she can now see them--she will call back after she checks tonite and if she still can't see labs, she will call back to office and let us know to see if we know why she is not seeing results and try to fix problem

## 2015-07-02 NOTE — Telephone Encounter (Signed)
Patients wife called stating she can't see his lab work on [employer] Please call her

## 2015-08-12 ENCOUNTER — Telehealth: Payer: Self-pay | Admitting: Internal Medicine

## 2015-08-12 NOTE — Telephone Encounter (Signed)
Received records from Dr. Clent Jacks forwarded 1 page to Dr. Tyrone Apple Plotnikov 08/12/15 [facility].

## 2015-08-19 ENCOUNTER — Other Ambulatory Visit: Payer: Self-pay | Admitting: *Deleted

## 2015-08-19 ENCOUNTER — Encounter: Payer: Self-pay | Admitting: Internal Medicine

## 2015-08-19 MED ORDER — GLUCOSE BLOOD VI STRP: ORAL_STRIP | Status: DC

## 2015-09-21 ENCOUNTER — Ambulatory Visit: Payer: BLUE CROSS/BLUE SHIELD | Admitting: Internal Medicine

## 2015-09-21 ENCOUNTER — Ambulatory Visit: Payer: BLUE CROSS/BLUE SHIELD

## 2015-09-22 ENCOUNTER — Telehealth: Payer: Self-pay | Admitting: *Deleted

## 2015-09-22 MED ORDER — GLUCOSE BLOOD VI STRP: ORAL_STRIP | Status: DC

## 2015-09-22 MED ORDER — FREESTYLE UNISTICK II LANCETS MISC: 1.0000 | Freq: Two times a day (BID) | Status: DC

## 2015-09-22 NOTE — Telephone Encounter (Signed)
Pharmacist left msg on triage requesting refills on pt freestyle strips. Sent electronically....Johny Chess

## 2015-09-23 ENCOUNTER — Ambulatory Visit: Payer: BLUE CROSS/BLUE SHIELD

## 2015-09-29 ENCOUNTER — Ambulatory Visit: Payer: BLUE CROSS/BLUE SHIELD | Admitting: Internal Medicine

## 2015-10-05 ENCOUNTER — Ambulatory Visit: Payer: BLUE CROSS/BLUE SHIELD | Admitting: Podiatry

## 2015-10-08 ENCOUNTER — Ambulatory Visit (INDEPENDENT_AMBULATORY_CARE_PROVIDER_SITE_OTHER): Payer: BLUE CROSS/BLUE SHIELD

## 2015-10-08 DIAGNOSIS — Z23 Encounter for immunization: Secondary | ICD-10-CM

## 2015-10-11 ENCOUNTER — Encounter: Payer: Self-pay | Admitting: Podiatry

## 2015-10-11 ENCOUNTER — Ambulatory Visit (INDEPENDENT_AMBULATORY_CARE_PROVIDER_SITE_OTHER): Payer: BLUE CROSS/BLUE SHIELD | Admitting: Podiatry

## 2015-10-11 VITALS — BP 154/91 | HR 63 | Resp 16

## 2015-10-11 DIAGNOSIS — Q828 Other specified congenital malformations of skin: Secondary | ICD-10-CM

## 2015-10-11 DIAGNOSIS — E119 Type 2 diabetes mellitus without complications: Secondary | ICD-10-CM

## 2015-10-13 NOTE — Progress Notes (Signed)
Subjective:     Patient ID: Brian Ayers, male   DOB: 04/04/1951, 64 y.o.   MRN: [id number]  HPI patient presents with painful callus underneath the fifth metatarsal of both feet stating that they make it hard to walk on it or wear shoe gear comfortably   Review of Systems     Objective:   Physical Exam Neurovascular status found to be intact patient is noted to have lesions underneath the fifth metatarsals of both feet with lucent-type core is and does have diabetes of long-term nature with moderate diminishment of sharp Dole vibratory    Assessment:     Keratotic lesions that are painful and he cannot cut himself with at risk diabetic environment    Plan:     Debridement of lesions accomplished today with no iatrogenic bleeding and reappoint to recheck

## 2015-10-14 ENCOUNTER — Encounter: Payer: Self-pay | Admitting: Internal Medicine

## 2015-10-18 ENCOUNTER — Ambulatory Visit: Payer: BLUE CROSS/BLUE SHIELD

## 2015-10-28 ENCOUNTER — Other Ambulatory Visit (INDEPENDENT_AMBULATORY_CARE_PROVIDER_SITE_OTHER): Payer: BLUE CROSS/BLUE SHIELD

## 2015-10-28 DIAGNOSIS — E119 Type 2 diabetes mellitus without complications: Secondary | ICD-10-CM | POA: Diagnosis not present

## 2015-10-28 LAB — BASIC METABOLIC PANEL
BUN: 15 mg/dL (ref 6–23)
CALCIUM: 9.3 mg/dL (ref 8.4–10.5)
CO2: 29 mEq/L (ref 19–32)
CREATININE: 0.89 mg/dL (ref 0.40–1.50)
Chloride: 105 mEq/L (ref 96–112)
GFR: 91.93 mL/min (ref >60.00)
Glucose, Bld: 111 mg/dL — ABNORMAL HIGH (ref 70–99)
POTASSIUM: 4.2 meq/L (ref 3.5–5.1)
Sodium: 140 mEq/L (ref 135–145)

## 2015-10-28 LAB — HEMOGLOBIN A1C: HEMOGLOBIN A1C: 6.1 % (ref 4.6–6.5)

## 2015-11-01 ENCOUNTER — Encounter: Payer: Self-pay | Admitting: Internal Medicine

## 2015-11-01 ENCOUNTER — Ambulatory Visit (INDEPENDENT_AMBULATORY_CARE_PROVIDER_SITE_OTHER): Payer: BLUE CROSS/BLUE SHIELD | Admitting: Internal Medicine

## 2015-11-01 VITALS — BP 148/84 | HR 78 | Wt 199.0 lb

## 2015-11-01 DIAGNOSIS — E785 Hyperlipidemia, unspecified: Secondary | ICD-10-CM | POA: Diagnosis not present

## 2015-11-01 DIAGNOSIS — M6530 Trigger finger, unspecified finger: Secondary | ICD-10-CM | POA: Diagnosis not present

## 2015-11-01 DIAGNOSIS — R030 Elevated blood-pressure reading, without diagnosis of hypertension: Secondary | ICD-10-CM

## 2015-11-01 DIAGNOSIS — E119 Type 2 diabetes mellitus without complications: Secondary | ICD-10-CM | POA: Diagnosis not present

## 2015-11-01 MED ORDER — METHYLPREDNISOLONE ACETATE 40 MG/ML IJ SUSP
10.0000 mg | Freq: Once | INTRAMUSCULAR | Status: AC
  Administered 2015-11-01: 10 mg via INTRA_ARTICULAR

## 2015-11-01 MED ORDER — FLUTICASONE-SALMETEROL 100-50 MCG/DOSE IN AEPB: 1.0000 | INHALATION_SPRAY | Freq: Two times a day (BID) | RESPIRATORY_TRACT | Status: DC

## 2015-11-01 MED ORDER — AZELASTINE-FLUTICASONE 137-50 MCG/ACT NA SUSP: NASAL | Status: DC

## 2015-11-01 MED ORDER — METHYLPREDNISOLONE ACETATE 20 MG/ML IJ SUSP: 10.0000 mg | Freq: Once | INTRAMUSCULAR | Status: DC

## 2015-11-01 MED ORDER — RANITIDINE HCL 150 MG PO TABS: 150.0000 mg | ORAL_TABLET | Freq: Two times a day (BID) | ORAL | Status: DC

## 2015-11-01 MED ORDER — ROSUVASTATIN CALCIUM 10 MG PO TABS: 10.0000 mg | ORAL_TABLET | ORAL | Status: DC

## 2015-11-01 MED ORDER — LORATADINE 10 MG PO TABS
10.0000 mg | ORAL_TABLET | Freq: Every day | ORAL | Status: DC
Start: 2015-11-01 — End: 2017-01-18

## 2015-11-01 MED ORDER — METFORMIN HCL 500 MG PO TABS: 500.0000 mg | ORAL_TABLET | Freq: Two times a day (BID) | ORAL | Status: DC

## 2015-11-01 NOTE — Assessment & Plan Note (Signed)
Doing well - nl BP at home

## 2015-11-01 NOTE — Assessment & Plan Note (Signed)
-   Crestor 

## 2015-11-01 NOTE — Progress Notes (Signed)
Pre visit review using our clinic review tool, if applicable. No additional management support is needed unless otherwise documented below in the visit note. 

## 2015-11-01 NOTE — Assessment & Plan Note (Signed)
On Metformin 

## 2015-11-01 NOTE — Progress Notes (Signed)
Subjective:  Patient ID: Brian Ayers, male    DOB: 05-05-1951  Age: 64 y.o. MRN: [id number]  CC: No chief complaint on file.   HPI Brian Ayers presents for DM, HTN, dyslipidemia f/u  Outpatient Prescriptions Prior to Visit  Medication Sig Dispense Refill  . aspirin 81 MG EC tablet Take 81 mg by mouth daily.      . Biotin 2500 MCG CAPS Take by mouth.      Marland Kitchen FREESTYLE UNISTICK II LANCETS MISC 1 each by Other route 2 (two) times daily. Use to help check blood sugars twice a dya Dx e11.9 100 each 5  . glucose blood (FREESTYLE INSULINX TEST) test strip Use two times daily as instructed. Dx: E11.9 100 each 5  . PROAIR HFA 108 (90 BASE) MCG/ACT inhaler as needed.  0  . UNABLE TO FIND Med Name: Takes allergy shots weekly    . benzonatate (TESSALON) 200 MG capsule TAKE 1 CAPSULE (200 MG TOTAL) BY MOUTH 3 (THREE) TIMES DAILY AS NEEDED FOR COUGH. 60 capsule 0  . DYMISTA 137-50 MCG/ACT SUSP     . Fluticasone-Salmeterol (ADVAIR) 100-50 MCG/DOSE AEPB Inhale 1 puff into the lungs 2 (two) times daily.    Marland Kitchen levocetirizine (XYZAL) 5 MG tablet as needed.  3  . loratadine (CLARITIN) 10 MG tablet Take 1 tablet (10 mg total) by mouth daily. 100 tablet 3  . metFORMIN (GLUCOPHAGE) 500 MG tablet TAKE 1 TABLET BY MOUTH TWICE A DAY WITH MEALS 60 tablet 11  . ranitidine (ZANTAC) 150 MG tablet Take 1 tablet (150 mg total) by mouth 2 (two) times daily. 180 tablet 3  . rosuvastatin (CRESTOR) 10 MG tablet Take 1 tablet (10 mg total) by mouth every other day. 30 tablet 5   No facility-administered medications prior to visit.    ROS Review of Systems  Constitutional: Negative for appetite change, fatigue and unexpected weight change.  HENT: Negative for congestion, nosebleeds, sneezing, sore throat and trouble swallowing.   Eyes: Negative for itching and visual disturbance.  Respiratory: Negative for cough.   Cardiovascular: Negative for chest pain, palpitations and leg swelling.  Gastrointestinal:  Negative for nausea, diarrhea, blood in stool and abdominal distention.  Genitourinary: Negative for frequency and hematuria.  Musculoskeletal: Negative for back pain, joint swelling, gait problem and neck pain.  Skin: Negative for rash.  Neurological: Negative for dizziness, tremors, speech difficulty and weakness.  Psychiatric/Behavioral: Negative for suicidal ideas, sleep disturbance, dysphoric mood and agitation. The patient is not nervous/anxious.     Objective:  BP 148/84 mmHg  Pulse 78  Wt 199 lb (90.266 kg)  SpO2 98%  BP Readings from Last 3 Encounters:  11/01/15 148/84  10/11/15 154/91  06/29/15 160/98    Wt Readings from Last 3 Encounters:  11/01/15 199 lb (90.266 kg)  06/29/15 195 lb (88.451 kg)  05/27/15 189 lb (85.73 kg)    Physical Exam  Constitutional: He is oriented to person, place, and time. He appears well-developed. No distress.  NAD  HENT:  Mouth/Throat: Oropharynx is clear and moist.  Eyes: Conjunctivae are normal. Pupils are equal, round, and reactive to light.  Neck: Normal range of motion. No JVD present. No thyromegaly present.  Cardiovascular: Normal rate, regular rhythm, normal heart sounds and intact distal pulses.  Exam reveals no gallop and no friction rub.   No murmur heard. Pulmonary/Chest: Effort normal and breath sounds normal. No respiratory distress. He has no wheezes. He has no rales. He exhibits no tenderness.  Abdominal: Soft. Bowel sounds are normal. He exhibits no distension and no mass. There is no tenderness. There is no rebound and no guarding.  Musculoskeletal: Normal range of motion. He exhibits no edema or tenderness.  Lymphadenopathy:    He has no cervical adenopathy.  Neurological: He is alert and oriented to person, place, and time. He has normal reflexes. No cranial nerve deficit. He exhibits normal muscle tone. He displays a negative Romberg sign. Coordination and gait normal.  Skin: Skin is warm and dry. No rash noted.    Psychiatric: He has a normal mood and affect. His behavior is normal. Judgment and thought content normal.    Lab Results  Component Value Date   WBC 6.1 06/28/2015   HGB 14.9 06/28/2015   HCT 43.3 06/28/2015   PLT 235.0 06/28/2015   GLUCOSE 111* 10/28/2015   CHOL 178 06/28/2015   TRIG 107.0 06/28/2015   HDL 50.70 06/28/2015   LDLCALC 106* 06/28/2015   ALT 22 06/28/2015   AST 20 06/28/2015   NA 140 10/28/2015   K 4.2 10/28/2015   CL 105 10/28/2015   CREATININE 0.89 10/28/2015   BUN 15 10/28/2015   CO2 29 10/28/2015   TSH 2.37 06/28/2015   PSA 0.82 06/28/2015   HGBA1C 6.1 10/28/2015   MICROALBUR <0.7 06/29/2015    No results found.   Expand All Collapse All     Subjective:    HPI  F/u DM 2 - new onset 2015 - doing well on Metformin bid  F/u occ GERD and cough - doing well F/u allergies and asthma - controlled BP is nl at home  Dr Doristine Johns, Dr Henrene Pastor Dr Nicki Reaper, DO   Wt Readings from Last 3 Encounters:  03/31/15 188 lb (85.276 kg)  01/12/15 187 lb (84.823 kg)  09/09/14 193 lb (87.544 kg)   BP Readings from Last 3 Encounters:  03/31/15 136/78  01/12/15 152/90  09/09/14 160/92      Review of Systems  Constitutional: Negative for appetite change, fatigue and unexpected weight change.  HENT: Negative for congestion, dental problem, nosebleeds, sneezing, sore throat and trouble swallowing.  Eyes: Negative for itching and visual disturbance.  Respiratory: Negative for cough.  Cardiovascular: Negative for chest pain, palpitations and leg swelling.  Gastrointestinal: Negative for nausea, diarrhea, blood in stool and abdominal distention.  Genitourinary: Negative for frequency and hematuria.  Musculoskeletal: Negative for back pain, joint swelling, gait problem and neck pain.  Skin: Negative for rash and wound.  Neurological: Negative for dizziness, tremors, speech difficulty and weakness.  Psychiatric/Behavioral: Negative for suicidal  ideas, sleep disturbance, dysphoric mood and agitation. The patient is not nervous/anxious.       Objective:   Physical Exam  Constitutional: He is oriented to person, place, and time. He appears well-developed. No distress.  NAD  HENT:  Mouth/Throat: Oropharynx is clear and moist.  Eyes: Conjunctivae are normal. Pupils are equal, round, and reactive to light.  Neck: Normal range of motion. No JVD present. No thyromegaly present.  Cardiovascular: Normal rate, regular rhythm, normal heart sounds and intact distal pulses. Exam reveals no gallop and no friction rub.  No murmur heard. Pulmonary/Chest: Effort normal and breath sounds normal. No respiratory distress. He has no wheezes. He has no rales. He exhibits no tenderness.  Abdominal: Soft. Bowel sounds are normal. He exhibits no distension and no mass. There is no tenderness. There is no rebound and no guarding.  Musculoskeletal: Normal range of motion. He exhibits no edema or tenderness.  Lymphadenopathy:   He has no cervical adenopathy.  Neurological: He is alert and oriented to person, place, and time. He has normal reflexes. No cranial nerve deficit. He exhibits normal muscle tone. He displays a negative Romberg sign. Coordination and gait normal.  Skin: Skin is warm and dry. No rash noted.  Psychiatric: He has a normal mood and affect. His behavior is normal. Judgment and thought content normal.  L 3d finger - triggering w/pain Lab Results  Component Value Date   WBC 8.2 05/26/2014   HGB 14.9 05/26/2014   HCT 43.7 05/26/2014   PLT 229.0 05/26/2014   GLUCOSE 117* 01/06/2015   CHOL 177 05/26/2014   TRIG 118.0 05/26/2014   HDL 51.80 05/26/2014   LDLCALC 102* 05/26/2014   ALT 41 05/26/2014   AST 29 05/26/2014   NA 138 01/06/2015   K 4.2 01/06/2015   CL 104 01/06/2015   CREATININE 0.95 01/06/2015   BUN 14 01/06/2015   CO2 28 01/06/2015   TSH  2.50 05/26/2014   PSA 0.82 05/26/2014   HGBA1C 6.1 01/06/2015     Procedure Note :   Trigger finger tendon Injection:  Indication : Trigger finger L 3d  Risks including unsuccessful procedure , bleeding, infection, bruising, skin atrophy and others were explained to the patient in detail as well as the benefits. Informed consent was obtained and signed.   Tthe patient was placed in a comfortable position. Flexor digitorum tendon was marked and the skin was prepped with Betadine and alcohol. 1 inch 25-gauge needle was used. The needle was advanced into the skin down to tendon. It was injected with 0.5 mL of 2% lidocaine and 10 mg of Depo-Medrol in a usual fashion. Band-Aids applied.  Tolerated well. Complications: None. Good pain relief following the procedure.         Assessment & Plan:   Diagnoses and all orders for this visit:  Controlled type 2 diabetes mellitus without complication, without long-term current use of insulin (HCC)  Dyslipidemia  Elevated blood pressure reading without diagnosis of hypertension  Trigger finger, acquired -     methylPREDNISolone acetate (DEPO-MEDROL) injection 10 mg; Inject 0.25 mLs (10 mg total) into the articular space once.  Other orders -     rosuvastatin (CRESTOR) 10 MG tablet; Take 1 tablet (10 mg total) by mouth every other day. -     metFORMIN (GLUCOPHAGE) 500 MG tablet; Take 1 tablet (500 mg total) by mouth 2 (two) times daily with a meal. -     ranitidine (ZANTAC) 150 MG tablet; Take 1 tablet (150 mg total) by mouth 2 (two) times daily. -     Azelastine-Fluticasone (DYMISTA) 137-50 MCG/ACT SUSP; 1 spr each nostril bid -     loratadine (CLARITIN) 10 MG tablet; Take 1 tablet (10 mg total) by mouth daily. -     Fluticasone-Salmeterol (ADVAIR) 100-50 MCG/DOSE AEPB; Inhale 1 puff into the lungs 2 (two) times daily.   I have discontinued Mr. Germani's levocetirizine and benzonatate. I have changed his DYMISTA to  Azelastine-Fluticasone. I have also changed his metFORMIN. Additionally, I am having him maintain his aspirin, Biotin, UNABLE TO FIND, PROAIR HFA, glucose blood, FREESTYLE UNISTICK II LANCETS, Saw Palmetto (Serenoa repens), rosuvastatin, ranitidine, loratadine, and Fluticasone-Salmeterol. We administered methylPREDNISolone acetate.  Meds ordered this encounter  Medications  . Saw Palmetto, Serenoa repens, 450 MG CAPS    Sig: Take 1 capsule by mouth 2 (two) times daily.  . rosuvastatin (CRESTOR) 10 MG tablet  Sig: Take 1 tablet (10 mg total) by mouth every other day.    Dispense:  90 tablet    Refill:  3  . metFORMIN (GLUCOPHAGE) 500 MG tablet    Sig: Take 1 tablet (500 mg total) by mouth 2 (two) times daily with a meal.    Dispense:  180 tablet    Refill:  3  . ranitidine (ZANTAC) 150 MG tablet    Sig: Take 1 tablet (150 mg total) by mouth 2 (two) times daily.    Dispense:  180 tablet    Refill:  3  . Azelastine-Fluticasone (DYMISTA) 137-50 MCG/ACT SUSP    Sig: 1 spr each nostril bid    Dispense:  3 Bottle    Refill:  3  . loratadine (CLARITIN) 10 MG tablet    Sig: Take 1 tablet (10 mg total) by mouth daily.    Dispense:  100 tablet    Refill:  3  . Fluticasone-Salmeterol (ADVAIR) 100-50 MCG/DOSE AEPB    Sig: Inhale 1 puff into the lungs 2 (two) times daily.    Dispense:  180 each    Refill:  3  . methylPREDNISolone acetate (DEPO-MEDROL) injection 10 mg    Sig:      Follow-up: Return in about 4 months (around 02/29/2016) for a follow-up visit.  Walker Kehr, MD

## 2015-11-02 ENCOUNTER — Encounter: Payer: Self-pay | Admitting: Internal Medicine

## 2016-01-18 ENCOUNTER — Other Ambulatory Visit: Payer: Self-pay | Admitting: Internal Medicine

## 2016-01-18 DIAGNOSIS — E119 Type 2 diabetes mellitus without complications: Secondary | ICD-10-CM

## 2016-01-20 ENCOUNTER — Ambulatory Visit: Payer: BLUE CROSS/BLUE SHIELD | Admitting: Internal Medicine

## 2016-02-08 ENCOUNTER — Other Ambulatory Visit: Payer: Self-pay | Admitting: General Practice

## 2016-02-08 MED ORDER — ROSUVASTATIN CALCIUM 10 MG PO TABS: 10.0000 mg | ORAL_TABLET | ORAL | Status: DC

## 2016-02-29 ENCOUNTER — Ambulatory Visit: Payer: BLUE CROSS/BLUE SHIELD | Admitting: Internal Medicine

## 2016-03-02 ENCOUNTER — Other Ambulatory Visit (INDEPENDENT_AMBULATORY_CARE_PROVIDER_SITE_OTHER): Payer: BLUE CROSS/BLUE SHIELD

## 2016-03-02 DIAGNOSIS — E119 Type 2 diabetes mellitus without complications: Secondary | ICD-10-CM

## 2016-03-02 LAB — BASIC METABOLIC PANEL
BUN: 17 mg/dL (ref 6–23)
CO2: 29 mEq/L (ref 19–32)
CREATININE: 0.93 mg/dL (ref 0.40–1.50)
Calcium: 9.2 mg/dL (ref 8.4–10.5)
Chloride: 101 mEq/L (ref 96–112)
GFR: 87.29 mL/min (ref >60.00)
Glucose, Bld: 122 mg/dL — ABNORMAL HIGH (ref 70–99)
Potassium: 4.4 mEq/L (ref 3.5–5.1)
Sodium: 138 mEq/L (ref 135–145)

## 2016-03-02 LAB — HEMOGLOBIN A1C: HEMOGLOBIN A1C: 6.5 % (ref 4.6–6.5)

## 2016-03-03 ENCOUNTER — Other Ambulatory Visit: Payer: Self-pay | Admitting: Internal Medicine

## 2016-03-07 ENCOUNTER — Encounter: Payer: Self-pay | Admitting: Internal Medicine

## 2016-03-13 ENCOUNTER — Encounter: Payer: Self-pay | Admitting: Internal Medicine

## 2016-03-13 ENCOUNTER — Ambulatory Visit (INDEPENDENT_AMBULATORY_CARE_PROVIDER_SITE_OTHER): Payer: BLUE CROSS/BLUE SHIELD | Admitting: Internal Medicine

## 2016-03-13 VITALS — BP 160/80 | HR 70 | Wt 200.0 lb

## 2016-03-13 DIAGNOSIS — J4520 Mild intermittent asthma, uncomplicated: Secondary | ICD-10-CM

## 2016-03-13 DIAGNOSIS — R030 Elevated blood-pressure reading, without diagnosis of hypertension: Secondary | ICD-10-CM | POA: Diagnosis not present

## 2016-03-13 DIAGNOSIS — E119 Type 2 diabetes mellitus without complications: Secondary | ICD-10-CM | POA: Diagnosis not present

## 2016-03-13 DIAGNOSIS — J310 Chronic rhinitis: Secondary | ICD-10-CM | POA: Diagnosis not present

## 2016-03-13 DIAGNOSIS — J45909 Unspecified asthma, uncomplicated: Secondary | ICD-10-CM | POA: Insufficient documentation

## 2016-03-13 DIAGNOSIS — IMO0001 Reserved for inherently not codable concepts without codable children: Secondary | ICD-10-CM

## 2016-03-13 MED ORDER — MONTELUKAST SODIUM 10 MG PO TABS: 10.0000 mg | ORAL_TABLET | Freq: Every day | ORAL | Status: DC

## 2016-03-13 NOTE — Assessment & Plan Note (Signed)
BP Readings from Last 3 Encounters:  03/13/16 160/100  11/01/15 148/84  10/11/15 154/91

## 2016-03-13 NOTE — Patient Instructions (Signed)
Try Afrin on the days of flying

## 2016-03-13 NOTE — Assessment & Plan Note (Signed)
On Metformin 

## 2016-03-13 NOTE — Assessment & Plan Note (Signed)
Chronic  Afrin prn flying Dymista Claritin, singulair Allergy shots Dr Doristine Johns

## 2016-03-13 NOTE — Progress Notes (Signed)
Subjective:  Patient ID: Brian Ayers, male    DOB: 04-Feb-1951  Age: 65 y.o. MRN: [id number]  CC: No chief complaint on file.   HPI RACHIT FLADGER presents for DM, allergies - bad this spring. BP ok at home Lottie took Predn and Z pac x 1 wk.   Outpatient Prescriptions Prior to Visit  Medication Sig Dispense Refill  . aspirin 81 MG EC tablet Take 81 mg by mouth daily.      . Azelastine-Fluticasone (DYMISTA) 137-50 MCG/ACT SUSP 1 spr each nostril bid 3 Bottle 3  . benzonatate (TESSALON) 200 MG capsule TAKE 1 CAPSULE (200 MG TOTAL) BY MOUTH 3 (THREE) TIMES DAILY AS NEEDED FOR COUGH. 60 capsule 0  . Biotin 2500 MCG CAPS Take by mouth.      . Fluticasone-Salmeterol (ADVAIR) 100-50 MCG/DOSE AEPB Inhale 1 puff into the lungs 2 (two) times daily. 180 each 3  . FREESTYLE UNISTICK II LANCETS MISC 1 each by Other route 2 (two) times daily. Use to help check blood sugars twice a dya Dx e11.9 100 each 5  . glucose blood (FREESTYLE INSULINX TEST) test strip Use two times daily as instructed. Dx: E11.9 100 each 5  . loratadine (CLARITIN) 10 MG tablet Take 1 tablet (10 mg total) by mouth daily. 100 tablet 3  . metFORMIN (GLUCOPHAGE) 500 MG tablet Take 1 tablet (500 mg total) by mouth 2 (two) times daily with a meal. 180 tablet 3  . PROAIR HFA 108 (90 BASE) MCG/ACT inhaler as needed.  0  . ranitidine (ZANTAC) 150 MG tablet Take 1 tablet (150 mg total) by mouth 2 (two) times daily. 180 tablet 3  . rosuvastatin (CRESTOR) 10 MG tablet Take 1 tablet (10 mg total) by mouth every other day. 45 tablet 1  . Saw Palmetto, Serenoa repens, 450 MG CAPS Take 1 capsule by mouth 2 (two) times daily.    Marland Kitchen UNABLE TO FIND Med Name: Takes allergy shots weekly     Facility-Administered Medications Prior to Visit  Medication Dose Route Frequency Provider Last Rate Last Dose  . methylPREDNISolone acetate (DEPO-MEDROL) 20 MG/ML injection 10 mg  10 mg Intra-Lesional Once Aleksei Plotnikov V, MD        ROS Review of  Systems  Constitutional: Negative for appetite change, fatigue and unexpected weight change.  HENT: Positive for congestion, rhinorrhea and sneezing. Negative for nosebleeds, sore throat and trouble swallowing.   Eyes: Negative for itching and visual disturbance.  Respiratory: Positive for cough. Negative for chest tightness.   Cardiovascular: Negative for chest pain, palpitations and leg swelling.  Gastrointestinal: Negative for nausea, diarrhea, blood in stool and abdominal distention.  Genitourinary: Negative for frequency and hematuria.  Musculoskeletal: Negative for back pain, joint swelling, gait problem and neck pain.  Skin: Negative for rash.  Neurological: Negative for dizziness, tremors, speech difficulty and weakness.  Psychiatric/Behavioral: Negative for sleep disturbance, dysphoric mood and agitation. The patient is not nervous/anxious.     Objective:  BP 160/80 mmHg  Pulse 70  Wt 200 lb (90.719 kg)  SpO2 97%  BP Readings from Last 3 Encounters:  03/13/16 160/80  11/01/15 148/84  10/11/15 154/91    Wt Readings from Last 3 Encounters:  03/13/16 200 lb (90.719 kg)  11/01/15 199 lb (90.266 kg)  06/29/15 195 lb (88.451 kg)    Physical Exam  Constitutional: He is oriented to person, place, and time. He appears well-developed. No distress.  NAD  HENT:  Mouth/Throat: Oropharynx is clear and  moist.  Eyes: Conjunctivae are normal. Pupils are equal, round, and reactive to light.  Neck: Normal range of motion. No JVD present. No thyromegaly present.  Cardiovascular: Normal rate, regular rhythm, normal heart sounds and intact distal pulses.  Exam reveals no gallop and no friction rub.   No murmur heard. Pulmonary/Chest: Effort normal and breath sounds normal. No respiratory distress. He has no wheezes. He has no rales. He exhibits no tenderness.  Abdominal: Soft. Bowel sounds are normal. He exhibits no distension and no mass. There is no tenderness. There is no rebound and  no guarding.  Musculoskeletal: Normal range of motion. He exhibits no edema or tenderness.  Lymphadenopathy:    He has no cervical adenopathy.  Neurological: He is alert and oriented to person, place, and time. He has normal reflexes. No cranial nerve deficit. He exhibits normal muscle tone. He displays a negative Romberg sign. Coordination and gait normal.  Skin: Skin is warm and dry. No rash noted.  Psychiatric: He has a normal mood and affect. His behavior is normal. Judgment and thought content normal.    Lab Results  Component Value Date   WBC 6.1 06/28/2015   HGB 14.9 06/28/2015   HCT 43.3 06/28/2015   PLT 235.0 06/28/2015   GLUCOSE 122* 03/02/2016   CHOL 178 06/28/2015   TRIG 107.0 06/28/2015   HDL 50.70 06/28/2015   LDLCALC 106* 06/28/2015   ALT 22 06/28/2015   AST 20 06/28/2015   NA 138 03/02/2016   K 4.4 03/02/2016   CL 101 03/02/2016   CREATININE 0.93 03/02/2016   BUN 17 03/02/2016   CO2 29 03/02/2016   TSH 2.37 06/28/2015   PSA 0.82 06/28/2015   HGBA1C 6.5 03/02/2016   MICROALBUR <0.7 06/29/2015    No results found.  Assessment & Plan:   Diagnoses and all orders for this visit:  Controlled type 2 diabetes mellitus without complication, without long-term current use of insulin (HCC)  Asthma, chronic, mild intermittent, uncomplicated  Elevated BP  Other orders -     Discontinue: montelukast (SINGULAIR) 10 MG tablet; Take 1 tablet (10 mg total) by mouth daily. -     Discontinue: montelukast (SINGULAIR) 10 MG tablet; Take 1 tablet (10 mg total) by mouth daily. -     Discontinue: montelukast (SINGULAIR) 10 MG tablet; Take 1 tablet (10 mg total) by mouth daily. -     montelukast (SINGULAIR) 10 MG tablet; Take 1 tablet (10 mg total) by mouth daily.  I am having Mr. Pedrosa maintain his aspirin, Biotin, UNABLE TO FIND, PROAIR HFA, glucose blood, FREESTYLE UNISTICK II LANCETS, Saw Palmetto (Serenoa repens), metFORMIN, ranitidine, Azelastine-Fluticasone,  loratadine, Fluticasone-Salmeterol, rosuvastatin, benzonatate, and montelukast. We will continue to administer methylPREDNISolone acetate.  Meds ordered this encounter  Medications  . DISCONTD: montelukast (SINGULAIR) 10 MG tablet    Sig: Take 1 tablet (10 mg total) by mouth daily.    Dispense:  90 tablet    Refill:  3  . DISCONTD: montelukast (SINGULAIR) 10 MG tablet    Sig: Take 1 tablet (10 mg total) by mouth daily.    Dispense:  90 tablet    Refill:  3  . DISCONTD: montelukast (SINGULAIR) 10 MG tablet    Sig: Take 1 tablet (10 mg total) by mouth daily.    Dispense:  90 tablet    Refill:  3  . montelukast (SINGULAIR) 10 MG tablet    Sig: Take 1 tablet (10 mg total) by mouth daily.    Dispense:  90 tablet    Refill:  3     Follow-up: Return for a follow-up visit.  Walker Kehr, MD

## 2016-03-13 NOTE — Progress Notes (Signed)
Pre visit review using our clinic review tool, if applicable. No additional management support is needed unless otherwise documented below in the visit note. 

## 2016-04-25 ENCOUNTER — Other Ambulatory Visit: Payer: Self-pay | Admitting: Internal Medicine

## 2016-06-28 ENCOUNTER — Other Ambulatory Visit (INDEPENDENT_AMBULATORY_CARE_PROVIDER_SITE_OTHER): Payer: BLUE CROSS/BLUE SHIELD

## 2016-06-28 DIAGNOSIS — E119 Type 2 diabetes mellitus without complications: Secondary | ICD-10-CM

## 2016-06-28 LAB — BASIC METABOLIC PANEL
BUN: 24 mg/dL — AB (ref 6–23)
CHLORIDE: 107 meq/L (ref 96–112)
CO2: 26 mEq/L (ref 19–32)
CREATININE: 0.91 mg/dL (ref 0.40–1.50)
Calcium: 9.6 mg/dL (ref 8.4–10.5)
GFR: 89.41 mL/min (ref >60.00)
Glucose, Bld: 117 mg/dL — ABNORMAL HIGH (ref 70–99)
Potassium: 4.5 mEq/L (ref 3.5–5.1)
Sodium: 140 mEq/L (ref 135–145)

## 2016-06-28 LAB — HEMOGLOBIN A1C: HEMOGLOBIN A1C: 6.1 % (ref 4.6–6.5)

## 2016-07-04 ENCOUNTER — Ambulatory Visit (INDEPENDENT_AMBULATORY_CARE_PROVIDER_SITE_OTHER): Payer: BLUE CROSS/BLUE SHIELD | Admitting: Internal Medicine

## 2016-07-04 ENCOUNTER — Encounter: Payer: Self-pay | Admitting: Internal Medicine

## 2016-07-04 VITALS — BP 139/82 | HR 68 | Wt 205.0 lb

## 2016-07-04 DIAGNOSIS — R030 Elevated blood-pressure reading, without diagnosis of hypertension: Secondary | ICD-10-CM

## 2016-07-04 DIAGNOSIS — J310 Chronic rhinitis: Secondary | ICD-10-CM | POA: Diagnosis not present

## 2016-07-04 DIAGNOSIS — E119 Type 2 diabetes mellitus without complications: Secondary | ICD-10-CM | POA: Diagnosis not present

## 2016-07-04 DIAGNOSIS — J4520 Mild intermittent asthma, uncomplicated: Secondary | ICD-10-CM | POA: Diagnosis not present

## 2016-07-04 DIAGNOSIS — Z0000 Encounter for general adult medical examination without abnormal findings: Secondary | ICD-10-CM

## 2016-07-04 DIAGNOSIS — IMO0001 Reserved for inherently not codable concepts without codable children: Secondary | ICD-10-CM

## 2016-07-04 MED ORDER — FLUTICASONE FUROATE-VILANTEROL 200-25 MCG/INH IN AEPB: 1.0000 | INHALATION_SPRAY | Freq: Every day | RESPIRATORY_TRACT | Status: DC

## 2016-07-04 NOTE — Assessment & Plan Note (Signed)
Advair - try Breo 200 Proventil prn, Singulair Dr Orvil Feil

## 2016-07-04 NOTE — Progress Notes (Signed)
Pre visit review using our clinic review tool, if applicable. No additional management support is needed unless otherwise documented below in the visit note. 

## 2016-07-04 NOTE — Assessment & Plan Note (Signed)
On Metformin 

## 2016-07-04 NOTE — Progress Notes (Signed)
Subjective:  Patient ID: Brian Ayers, male    DOB: 1951/09/11  Age: 65 y.o. MRN: [id number]  CC: No chief complaint on file.   HPI Brian Ayers presents for DM, allergies, asthma, dyslipidemia f/u. BP nl at home  Outpatient Prescriptions Prior to Visit  Medication Sig Dispense Refill  . aspirin 81 MG EC tablet Take 81 mg by mouth daily.      . Azelastine-Fluticasone (DYMISTA) 137-50 MCG/ACT SUSP 1 spr each nostril bid 3 Bottle 3  . benzonatate (TESSALON) 200 MG capsule TAKE 1 CAPSULE (200 MG TOTAL) BY MOUTH 3 (THREE) TIMES DAILY AS NEEDED FOR COUGH. 60 capsule 0  . Biotin 2500 MCG CAPS Take by mouth.      . Fluticasone-Salmeterol (ADVAIR) 100-50 MCG/DOSE AEPB Inhale 1 puff into the lungs 2 (two) times daily. 180 each 3  . FREESTYLE UNISTICK II LANCETS MISC 1 each by Other route 2 (two) times daily. Use to help check blood sugars twice a dya Dx e11.9 100 each 5  . glucose blood (FREESTYLE INSULINX TEST) test strip Use two times daily as instructed. Dx: E11.9 100 each 5  . loratadine (CLARITIN) 10 MG tablet Take 1 tablet (10 mg total) by mouth daily. 100 tablet 3  . metFORMIN (GLUCOPHAGE) 500 MG tablet Take 1 tablet (500 mg total) by mouth 2 (two) times daily with a meal. 180 tablet 3  . montelukast (SINGULAIR) 10 MG tablet Take 1 tablet (10 mg total) by mouth daily. 90 tablet 3  . PROAIR HFA 108 (90 BASE) MCG/ACT inhaler as needed.  0  . ranitidine (ZANTAC) 150 MG tablet Take 1 tablet (150 mg total) by mouth 2 (two) times daily. 180 tablet 3  . rosuvastatin (CRESTOR) 10 MG tablet Take 1 tablet (10 mg total) by mouth every other day. 45 tablet 1  . Saw Palmetto, Serenoa repens, 450 MG CAPS Take 1 capsule by mouth 2 (two) times daily.    Marland Kitchen UNABLE TO FIND Med Name: Takes allergy shots weekly     Facility-Administered Medications Prior to Visit  Medication Dose Route Frequency Provider Last Rate Last Dose  . methylPREDNISolone acetate (DEPO-MEDROL) 20 MG/ML injection 10 mg  10 mg  Intra-Lesional Once Cassandria Anger, MD        ROS Review of Systems  Constitutional: Negative for appetite change, fatigue and unexpected weight change.  HENT: Negative for congestion, nosebleeds, sneezing, sore throat and trouble swallowing.   Eyes: Negative for itching and visual disturbance.  Respiratory: Negative for cough.   Cardiovascular: Negative for chest pain, palpitations and leg swelling.  Gastrointestinal: Negative for nausea, diarrhea, blood in stool and abdominal distention.  Genitourinary: Negative for frequency and hematuria.  Musculoskeletal: Negative for back pain, joint swelling, gait problem and neck pain.  Skin: Negative for rash.  Neurological: Negative for dizziness, tremors, speech difficulty and weakness.  Psychiatric/Behavioral: Negative for sleep disturbance, dysphoric mood and agitation. The patient is not nervous/anxious.     Objective:  BP 148/88 mmHg  Pulse 68  Wt 205 lb (92.987 kg)  SpO2 95%  BP Readings from Last 3 Encounters:  07/04/16 148/88  03/13/16 160/80  11/01/15 148/84    Wt Readings from Last 3 Encounters:  07/04/16 205 lb (92.987 kg)  03/13/16 200 lb (90.719 kg)  11/01/15 199 lb (90.266 kg)    Physical Exam  Constitutional: He is oriented to person, place, and time. He appears well-developed. No distress.  NAD  HENT:  Mouth/Throat: Oropharynx is clear and  moist.  Eyes: Conjunctivae are normal. Pupils are equal, round, and reactive to light.  Neck: Normal range of motion. No JVD present. No thyromegaly present.  Cardiovascular: Normal rate, regular rhythm, normal heart sounds and intact distal pulses.  Exam reveals no gallop and no friction rub.   No murmur heard. Pulmonary/Chest: Effort normal and breath sounds normal. No respiratory distress. He has no wheezes. He has no rales. He exhibits no tenderness.  Abdominal: Soft. Bowel sounds are normal. He exhibits no distension and no mass. There is no tenderness. There is no  rebound and no guarding.  Musculoskeletal: Normal range of motion. He exhibits no edema or tenderness.  Lymphadenopathy:    He has no cervical adenopathy.  Neurological: He is alert and oriented to person, place, and time. He has normal reflexes. No cranial nerve deficit. He exhibits normal muscle tone. He displays a negative Romberg sign. Coordination and gait normal.  Skin: Skin is warm and dry. No rash noted.  Psychiatric: He has a normal mood and affect. His behavior is normal. Judgment and thought content normal.    Lab Results  Component Value Date   WBC 6.1 06/28/2015   HGB 14.9 06/28/2015   HCT 43.3 06/28/2015   PLT 235.0 06/28/2015   GLUCOSE 117* 06/28/2016   CHOL 178 06/28/2015   TRIG 107.0 06/28/2015   HDL 50.70 06/28/2015   LDLCALC 106* 06/28/2015   ALT 22 06/28/2015   AST 20 06/28/2015   NA 140 06/28/2016   K 4.5 06/28/2016   CL 107 06/28/2016   CREATININE 0.91 06/28/2016   BUN 24* 06/28/2016   CO2 26 06/28/2016   TSH 2.37 06/28/2015   PSA 0.82 06/28/2015   HGBA1C 6.1 06/28/2016   MICROALBUR <0.7 06/29/2015    No results found.  Assessment & Plan:   There are no diagnoses linked to this encounter. I am having Mr. Forero maintain his aspirin, Biotin, UNABLE TO FIND, PROAIR HFA, glucose blood, FREESTYLE UNISTICK II LANCETS, Saw Palmetto (Serenoa repens), metFORMIN, ranitidine, Azelastine-Fluticasone, loratadine, Fluticasone-Salmeterol, rosuvastatin, montelukast, benzonatate, and EPINEPHrine. We will continue to administer methylPREDNISolone acetate.  Meds ordered this encounter  Medications  . EPINEPHrine 0.3 mg/0.3 mL IJ SOAJ injection    Sig: as needed.    Refill:  0     Follow-up: No Follow-up on file.  Walker Kehr, MD

## 2016-07-04 NOTE — Assessment & Plan Note (Signed)
Afrin prn flying Dymista Claritin, singulair Allergy shots Dr Doristine Johns

## 2016-07-04 NOTE — Assessment & Plan Note (Signed)
Nl BP °

## 2016-07-06 ENCOUNTER — Encounter: Payer: Self-pay | Admitting: Internal Medicine

## 2016-07-11 ENCOUNTER — Ambulatory Visit: Payer: BLUE CROSS/BLUE SHIELD | Admitting: Internal Medicine

## 2016-07-13 LAB — HM DIABETES EYE EXAM

## 2016-08-10 ENCOUNTER — Telehealth: Payer: Self-pay | Admitting: Emergency Medicine

## 2016-08-10 ENCOUNTER — Other Ambulatory Visit: Payer: Self-pay | Admitting: General Practice

## 2016-08-10 NOTE — Telephone Encounter (Signed)
Med rec faxed to patient.

## 2016-08-10 NOTE — Telephone Encounter (Signed)
Pt called and request a revised list of his medications and needs a few medications deleted then faxed to his [facility] doctor. Can you please give him a call to go over these meds with him. Thanks.

## 2016-10-09 ENCOUNTER — Ambulatory Visit (INDEPENDENT_AMBULATORY_CARE_PROVIDER_SITE_OTHER): Payer: BLUE CROSS/BLUE SHIELD

## 2016-10-09 DIAGNOSIS — Z23 Encounter for immunization: Secondary | ICD-10-CM

## 2016-10-17 ENCOUNTER — Ambulatory Visit: Payer: BLUE CROSS/BLUE SHIELD

## 2016-10-22 ENCOUNTER — Other Ambulatory Visit: Payer: Self-pay | Admitting: Internal Medicine

## 2016-10-25 ENCOUNTER — Other Ambulatory Visit (INDEPENDENT_AMBULATORY_CARE_PROVIDER_SITE_OTHER): Payer: BLUE CROSS/BLUE SHIELD

## 2016-10-25 DIAGNOSIS — E119 Type 2 diabetes mellitus without complications: Secondary | ICD-10-CM

## 2016-10-25 DIAGNOSIS — Z0000 Encounter for general adult medical examination without abnormal findings: Secondary | ICD-10-CM | POA: Diagnosis not present

## 2016-10-25 LAB — CBC WITH DIFFERENTIAL/PLATELET
Basophils Absolute: 0 10*3/uL (ref 0.0–0.1)
Basophils Relative: 0.6 % (ref 0.0–3.0)
EOS PCT: 5.3 % — AB (ref 0.0–5.0)
Eosinophils Absolute: 0.4 10*3/uL (ref 0.0–0.7)
HCT: 43.8 % (ref 39.0–52.0)
Hemoglobin: 15.1 g/dL (ref 13.0–17.0)
LYMPHS ABS: 2.7 10*3/uL (ref 0.7–4.0)
Lymphocytes Relative: 37.9 % (ref 12.0–46.0)
MCHC: 34.5 g/dL (ref 30.0–36.0)
MCV: 91.2 fl (ref 78.0–100.0)
MONOS PCT: 10.3 % (ref 3.0–12.0)
Monocytes Absolute: 0.7 10*3/uL (ref 0.1–1.0)
NEUTROS ABS: 3.3 10*3/uL (ref 1.4–7.7)
NEUTROS PCT: 45.9 % (ref 43.0–77.0)
PLATELETS: 235 10*3/uL (ref 150.0–400.0)
RBC: 4.8 Mil/uL (ref 4.22–5.81)
RDW: 12 % (ref 11.5–15.5)
WBC: 7.1 10*3/uL (ref 4.0–10.5)

## 2016-10-25 LAB — HEPATIC FUNCTION PANEL
ALBUMIN: 4.6 g/dL (ref 3.5–5.2)
ALK PHOS: 52 U/L (ref 39–117)
ALT: 25 U/L (ref 0–53)
AST: 19 U/L (ref 0–37)
BILIRUBIN TOTAL: 0.6 mg/dL (ref 0.2–1.2)
Bilirubin, Direct: 0.1 mg/dL (ref 0.0–0.3)
Total Protein: 6.7 g/dL (ref 6.0–8.3)

## 2016-10-25 LAB — URINALYSIS
Bilirubin Urine: NEGATIVE
Hgb urine dipstick: NEGATIVE
Ketones, ur: NEGATIVE
LEUKOCYTES UA: NEGATIVE
Nitrite: NEGATIVE
PH: 5 (ref 5.0–8.0)
Total Protein, Urine: NEGATIVE
URINE GLUCOSE: NEGATIVE
UROBILINOGEN UA: 0.2 (ref 0.0–1.0)

## 2016-10-25 LAB — PSA: PSA: 0.82 ng/mL (ref 0.10–4.00)

## 2016-10-25 LAB — LIPID PANEL
CHOLESTEROL: 214 mg/dL — AB (ref 0–200)
HDL: 50 mg/dL (ref >39.00)
LDL CALC: 124 mg/dL — AB (ref 0–99)
NonHDL: 163.96
TRIGLYCERIDES: 200 mg/dL — AB (ref 0.0–149.0)
Total CHOL/HDL Ratio: 4
VLDL: 40 mg/dL (ref 0.0–40.0)

## 2016-10-25 LAB — TSH: TSH: 3.64 u[IU]/mL (ref 0.35–4.50)

## 2016-10-25 LAB — HEMOGLOBIN A1C: HEMOGLOBIN A1C: 6.3 % (ref 4.6–6.5)

## 2016-10-25 LAB — BASIC METABOLIC PANEL
BUN: 17 mg/dL (ref 6–23)
CALCIUM: 9.5 mg/dL (ref 8.4–10.5)
CO2: 28 meq/L (ref 19–32)
Chloride: 104 mEq/L (ref 96–112)
Creatinine, Ser: 0.93 mg/dL (ref 0.40–1.50)
GFR: 87.1 mL/min (ref >60.00)
GLUCOSE: 115 mg/dL — AB (ref 70–99)
POTASSIUM: 4.4 meq/L (ref 3.5–5.1)
SODIUM: 140 meq/L (ref 135–145)

## 2016-10-25 LAB — MICROALBUMIN / CREATININE URINE RATIO
CREATININE, U: 21.1 mg/dL
MICROALB UR: 1.3 mg/dL (ref 0.0–1.9)
Microalb Creat Ratio: 6.2 mg/g (ref 0.0–30.0)

## 2016-10-26 LAB — HEPATITIS C ANTIBODY: HCV Ab: NEGATIVE

## 2016-10-31 ENCOUNTER — Encounter: Payer: Self-pay | Admitting: Internal Medicine

## 2016-11-06 ENCOUNTER — Telehealth: Payer: Self-pay | Admitting: Emergency Medicine

## 2016-11-06 ENCOUNTER — Encounter: Payer: Self-pay | Admitting: Internal Medicine

## 2016-11-06 ENCOUNTER — Ambulatory Visit (INDEPENDENT_AMBULATORY_CARE_PROVIDER_SITE_OTHER): Payer: BLUE CROSS/BLUE SHIELD | Admitting: Internal Medicine

## 2016-11-06 DIAGNOSIS — B354 Tinea corporis: Secondary | ICD-10-CM | POA: Insufficient documentation

## 2016-11-06 DIAGNOSIS — E785 Hyperlipidemia, unspecified: Secondary | ICD-10-CM

## 2016-11-06 DIAGNOSIS — K219 Gastro-esophageal reflux disease without esophagitis: Secondary | ICD-10-CM | POA: Diagnosis not present

## 2016-11-06 DIAGNOSIS — N529 Male erectile dysfunction, unspecified: Secondary | ICD-10-CM

## 2016-11-06 DIAGNOSIS — Z0000 Encounter for general adult medical examination without abnormal findings: Secondary | ICD-10-CM

## 2016-11-06 DIAGNOSIS — E119 Type 2 diabetes mellitus without complications: Secondary | ICD-10-CM

## 2016-11-06 MED ORDER — SILDENAFIL CITRATE 100 MG PO TABS: 100.0000 mg | ORAL_TABLET | ORAL | 11 refills | Status: DC | PRN

## 2016-11-06 MED ORDER — CLOTRIMAZOLE-BETAMETHASONE 1-0.05 % EX CREA: 1.0000 "application " | TOPICAL_CREAM | Freq: Two times a day (BID) | CUTANEOUS | 1 refills | Status: DC

## 2016-11-06 MED ORDER — ROSUVASTATIN CALCIUM 10 MG PO TABS: 10.0000 mg | ORAL_TABLET | Freq: Every day | ORAL | 3 refills | Status: DC

## 2016-11-06 NOTE — Progress Notes (Signed)
Subjective:  Patient ID: Brian Ayers, male    DOB: 03-09-1951  Age: 65 y.o. MRN: [id number]  CC: No chief complaint on file.   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 C/o ED  Outpatient Medications Prior to Visit  Medication Sig Dispense Refill  . aspirin 81 MG EC tablet Take 81 mg by mouth daily.      . Azelastine-Fluticasone (DYMISTA) 137-50 MCG/ACT SUSP 1 spr each nostril bid 3 Bottle 3  . Biotin 2500 MCG CAPS Take by mouth.      . EPINEPHrine 0.3 mg/0.3 mL IJ SOAJ injection as needed.  0  . Fluticasone-Salmeterol (ADVAIR) 100-50 MCG/DOSE AEPB Inhale 1 puff into the lungs 2 (two) times daily. 180 each 3  . FREESTYLE UNISTICK II LANCETS MISC 1 each by Other route 2 (two) times daily. Use to help check blood sugars twice a dya Dx e11.9 100 each 5  . glucose blood (FREESTYLE INSULINX TEST) test strip Use two times daily as instructed. Dx: E11.9 100 each 5  . loratadine (CLARITIN) 10 MG tablet Take 1 tablet (10 mg total) by mouth daily. 100 tablet 3  . metFORMIN (GLUCOPHAGE) 500 MG tablet TAKE 1 TABLET (500 MG TOTAL) BY MOUTH 2 (TWO) TIMES DAILY WITH A MEAL. 180 tablet 1  . ranitidine (ZANTAC) 150 MG tablet Take 1 tablet (150 mg total) by mouth 2 (two) times daily. 180 tablet 3  . Saw Palmetto, Serenoa repens, 450 MG CAPS Take 1 capsule by mouth 2 (two) times daily.    Marland Kitchen UNABLE TO FIND Med Name: Takes allergy shots weekly    . montelukast (SINGULAIR) 10 MG tablet Take 1 tablet (10 mg total) by mouth daily. (Patient not taking: Reported on 11/06/2016) 90 tablet 3  . PROAIR HFA 108 (90 BASE) MCG/ACT inhaler as needed.  0  . rosuvastatin (CRESTOR) 10 MG tablet Take 1 tablet (10 mg total) by mouth every other day. (Patient not taking: Reported on 11/06/2016) 45 tablet 1   Facility-Administered Medications Prior to Visit  Medication Dose Route Frequency Provider Last Rate Last Dose  . methylPREDNISolone acetate (DEPO-MEDROL) 20 MG/ML  injection 10 mg  10 mg Intra-Lesional Once Cassandria Anger, MD        ROS Review of Systems  Constitutional: Negative for appetite change, fatigue and unexpected weight change.  HENT: Negative for congestion, nosebleeds, sneezing, sore throat and trouble swallowing.   Eyes: Negative for itching and visual disturbance.  Respiratory: Negative for cough.   Cardiovascular: Negative for chest pain, palpitations and leg swelling.  Gastrointestinal: Negative for abdominal distention, blood in stool, diarrhea and nausea.  Genitourinary: Negative for frequency and hematuria.  Musculoskeletal: Negative for back pain, gait problem, joint swelling and neck pain.  Skin: Negative for rash.  Neurological: Negative for dizziness, tremors, speech difficulty and weakness.  Psychiatric/Behavioral: Negative for agitation, dysphoric mood and sleep disturbance. The patient is not nervous/anxious.     Objective:  BP (!) 156/92   Pulse 66   Wt 204 lb (92.5 kg)   SpO2 98%   BMI 29.27 kg/m   BP Readings from Last 3 Encounters:  11/06/16 (!) 156/92  07/04/16 139/82  03/13/16 (!) 160/80    Wt Readings from Last 3 Encounters:  11/06/16 204 lb (92.5 kg)  07/04/16 205 lb (93 kg)  03/13/16 200 lb (90.7 kg)    Physical Exam  Constitutional: He is oriented to person, place, and time. He appears  well-developed. No distress.  NAD  HENT:  Mouth/Throat: Oropharynx is clear and moist.  Eyes: Conjunctivae are normal. Pupils are equal, round, and reactive to light.  Neck: Normal range of motion. No JVD present. No thyromegaly present.  Cardiovascular: Normal rate, regular rhythm, normal heart sounds and intact distal pulses.  Exam reveals no gallop and no friction rub.   No murmur heard. Pulmonary/Chest: Effort normal and breath sounds normal. No respiratory distress. He has no wheezes. He has no rales. He exhibits no tenderness.  Abdominal: Soft. Bowel sounds are normal. He exhibits no distension and no  mass. There is no tenderness. There is no rebound and no guarding.  Musculoskeletal: Normal range of motion. He exhibits no edema or tenderness.  Lymphadenopathy:    He has no cervical adenopathy.  Neurological: He is alert and oriented to person, place, and time. He has normal reflexes. No cranial nerve deficit. He exhibits normal muscle tone. He displays a negative Romberg sign. Coordination and gait normal.  Skin: Skin is warm and dry. No rash noted.  Psychiatric: He has a normal mood and affect. His behavior is normal. Judgment and thought content normal.  pigm maculas in the L groin  Lab Results  Component Value Date   WBC 7.1 10/25/2016   HGB 15.1 10/25/2016   HCT 43.8 10/25/2016   PLT 235.0 10/25/2016   GLUCOSE 115 (H) 10/25/2016   CHOL 214 (H) 10/25/2016   TRIG 200.0 (H) 10/25/2016   HDL 50.00 10/25/2016   LDLCALC 124 (H) 10/25/2016   ALT 25 10/25/2016   AST 19 10/25/2016   NA 140 10/25/2016   K 4.4 10/25/2016   CL 104 10/25/2016   CREATININE 0.93 10/25/2016   BUN 17 10/25/2016   CO2 28 10/25/2016   TSH 3.64 10/25/2016   PSA 0.82 10/25/2016   HGBA1C 6.3 10/25/2016   MICROALBUR 1.3 10/25/2016    No results found.  Assessment & Plan:   There are no diagnoses linked to this encounter. I am having Mr. Carrington maintain his aspirin, Biotin, UNABLE TO FIND, PROAIR HFA, glucose blood, FREESTYLE UNISTICK II LANCETS, Saw Palmetto (Serenoa repens), ranitidine, Azelastine-Fluticasone, loratadine, Fluticasone-Salmeterol, rosuvastatin, montelukast, EPINEPHrine, and metFORMIN. We will continue to administer methylPREDNISolone acetate.  No orders of the defined types were placed in this encounter.    Follow-up: No Follow-up on file.  Walker Kehr, MD

## 2016-11-06 NOTE — Assessment & Plan Note (Addendum)
Worse - will go back to Crestor

## 2016-11-06 NOTE — Telephone Encounter (Signed)
Please advise- is there another cream he can use?

## 2016-11-06 NOTE — Progress Notes (Signed)
Pre visit review using our clinic review tool, if applicable. No additional management support is needed unless otherwise documented below in the visit note. 

## 2016-11-06 NOTE — Assessment & Plan Note (Signed)
We discussed age appropriate health related issues, including available/recomended screening tests and vaccinations. We discussed a need for adhering to healthy diet and exercise. Labs were reviewed . All questions were answered. 

## 2016-11-06 NOTE — Assessment & Plan Note (Signed)
Nl BP at home 

## 2016-11-06 NOTE — Telephone Encounter (Signed)
Pt called and needs you to call him back about the prescription clotrimazole-betamethasone (LOTRISONE) cream. The pharmacy doesn't care that mixture of cream. Please advise thanks.

## 2016-11-06 NOTE — Assessment & Plan Note (Signed)
Viagra prn Rx

## 2016-11-06 NOTE — Assessment & Plan Note (Signed)
Metformin 

## 2016-11-06 NOTE — Assessment & Plan Note (Signed)
Lotrisone prn 

## 2016-11-06 NOTE — Assessment & Plan Note (Signed)
On Ranitidine 

## 2016-11-07 NOTE — Telephone Encounter (Signed)
Pt informed . He states the pharmacy called him back and he filled the rx.

## 2016-11-07 NOTE — Telephone Encounter (Signed)
He can try OTC Lotrimin bid x 2-3 weeks Thx

## 2016-11-13 ENCOUNTER — Encounter: Payer: Self-pay | Admitting: Internal Medicine

## 2016-11-13 ENCOUNTER — Telehealth: Payer: Self-pay

## 2016-11-13 MED ORDER — ROSUVASTATIN CALCIUM 10 MG PO TABS: 10.0000 mg | ORAL_TABLET | Freq: Every day | ORAL | 3 refills | Status: DC

## 2016-11-13 NOTE — Telephone Encounter (Signed)
exr for crestor sent.

## 2016-11-14 ENCOUNTER — Encounter: Payer: Self-pay | Admitting: Internal Medicine

## 2016-11-23 ENCOUNTER — Other Ambulatory Visit: Payer: Self-pay | Admitting: Internal Medicine

## 2016-11-24 ENCOUNTER — Telehealth: Payer: Self-pay | Admitting: Internal Medicine

## 2016-11-24 NOTE — Telephone Encounter (Signed)
Patient states that insurance will not cover current glucose meter supplies.  Patient is requesting scripts for One Touch Ultra meter, strips and lancets to [facility].

## 2016-11-27 ENCOUNTER — Encounter: Payer: Self-pay | Admitting: Internal Medicine

## 2016-11-28 ENCOUNTER — Other Ambulatory Visit: Payer: Self-pay | Admitting: *Deleted

## 2016-11-28 MED ORDER — GLUCOSE BLOOD VI STRP: 1.0000 | ORAL_STRIP | Freq: Two times a day (BID) | 5 refills | Status: DC

## 2016-11-28 MED ORDER — ONETOUCH ULTRASOFT LANCETS MISC: 1.0000 | Freq: Two times a day (BID) | 5 refills | Status: DC

## 2016-11-28 MED ORDER — ONETOUCH ULTRA 2 W/DEVICE KIT: PACK | 0 refills | Status: DC

## 2016-11-28 NOTE — Telephone Encounter (Signed)
Pt sent email insurance will only cover the one touch ultra BS monitor he is requesting rx w/supplies [facility].../lmb

## 2016-11-28 NOTE — Telephone Encounter (Signed)
Duplicate msg's see email already been taking care of...Johny Chess

## 2016-11-30 ENCOUNTER — Other Ambulatory Visit: Payer: Self-pay | Admitting: Internal Medicine

## 2016-12-06 ENCOUNTER — Encounter: Payer: Self-pay | Admitting: Internal Medicine

## 2016-12-13 ENCOUNTER — Other Ambulatory Visit: Payer: Self-pay | Admitting: Internal Medicine

## 2016-12-13 DIAGNOSIS — E119 Type 2 diabetes mellitus without complications: Secondary | ICD-10-CM

## 2016-12-13 DIAGNOSIS — E785 Hyperlipidemia, unspecified: Secondary | ICD-10-CM

## 2016-12-13 NOTE — Progress Notes (Signed)
[name] [address], [place] [address] Phone: [phone] Subjective:    I'm seeing this patient by the request  of:  Walker Kehr, MD   CC: Bilateral foot pain  [id number]  Brian Ayers is a 65 y.o. male coming in with complaint of bilateral foot pain. Patient has been seen previously and 2-1/2 years ago. Patient was found to have breakdown of the transverse arch and mild breakdown of the longitudinal arch. Was putting custom orthotics. Doing very well but feels that the orthotics her starting to break down somewhat. Patient still some mild increase in tenderness recently. Not stopping him from activities but making certain ones difficulty. More walking causes more pain. Better with rest.     Past Medical History:  Diagnosis Date  . Alcohol abuse    hixtory of  . Allergy   . Asthma childhood  . Hx of colonic polyps   . Hyperlipidemia   . Rhinitis    on allery shots   Past Surgical History:  Procedure Laterality Date  . hernia with hydrocele  1973  . left shoulder pin setting  1978  . TONSILLECTOMY  1967  . WISDOM TOOTH EXTRACTION  2004   Social History   Social History  . Marital status: Married    Spouse name: N/A  . Number of children: 0  . Years of education: N/A   Occupational History  . 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 Smoking status: Former Smoker    Quit date: 12/18/1985  . Smokeless tobacco: Former Systems developer    Quit date: 12/18/1985     Comment: only for a short period in the 1980's  . Alcohol use No     Comment: history of ETOH abuse  . Drug use: No  . Sexual activity: No     Comment: abstinent since 2006   Other Topics Concern  . None   Social History Theme park manager. Married 1991- No children. Marriage is in good health. 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      Regular exercise: daily/walk at gym   Caffeine use: 2 cups of coffee daily        Allergies  Allergen Reactions  . Pseudoephedrine     REACTION: tachycardia   Family History  Problem Relation Age of Onset  . Cancer Mother     breast (hx of)  . Other Mother     trigeminal neuralgia s/p gamma knife/ CHF  . Hypertension Father   . Diabetes Neg Hx   . Colon cancer Neg Hx     Past medical history, social, surgical and family history all reviewed in electronic medical record.  No pertanent information unless stated regarding to the chief complaint.   Review of Systems:Review of systems updated and as accurate as of 12/14/16  No headache, visual changes, nausea, vomiting, diarrhea, constipation, dizziness, abdominal pain, skin rash, fevers, chills, night sweats, weight loss, swollen lymph nodes, body aches, joint swelling, muscle aches, chest pain, shortness of breath, mood changes.   Objective  Blood pressure (!) 146/84, pulse 62, height 5\' 10"  (1.778 m), weight 203 lb (92.1 kg), SpO2 96 %. Systems examined below as of 12/14/16   General: No apparent distress alert and oriented x3 mood and affect normal, dressed appropriately.  HEENT: Pupils equal, extraocular movements intact  Respiratory: Patient's speak in full sentences and does not appear short of breath  Cardiovascular: No lower extremity edema, non tender, no erythema  Skin: Warm dry intact with no signs of infection or rash on extremities or on axial skeleton.  Abdomen: Soft nontender  Neuro: Cranial nerves II through XII are intact, neurovascularly intact in all extremities with 2+ DTRs and 2+ pulses.  Lymph: No lymphadenopathy of posterior or anterior cervical chain or axillae bilaterally.  Gait normal with good balance and coordination.  MSK:  Non tender with full range of motion and good stability and symmetric strength and tone of shoulders, elbows, wrist, hip, knee and ankles bilaterally.  Bilateral foot exam shows breakdown of the transverse arch left greater than right. Patient is minimally tender  to palpation over th Although overpronation of the hindfoot on the left foot.e fourth metatarsals but otherwise unremarkable.   Impression and Recommendations:     This case required medical decision making of moderate complexity.      Note: This dictation was prepared with Dragon dictation along with smaller phrase technology. Any transcriptional errors that result from this process are unintentional.

## 2016-12-14 ENCOUNTER — Encounter: Payer: Self-pay | Admitting: Family Medicine

## 2016-12-14 ENCOUNTER — Ambulatory Visit (INDEPENDENT_AMBULATORY_CARE_PROVIDER_SITE_OTHER): Payer: BLUE CROSS/BLUE SHIELD | Admitting: Family Medicine

## 2016-12-14 DIAGNOSIS — M7740 Metatarsalgia, unspecified foot: Secondary | ICD-10-CM | POA: Diagnosis not present

## 2016-12-14 NOTE — Patient Instructions (Signed)
Great to see you  We will call you when we get the orthotics in  See you soon  Happy New Year!

## 2016-12-14 NOTE — Assessment & Plan Note (Signed)
Discussed with patient at great length. I do feel that some orthotics could be beneficial. Encourage him to continue the home exercises. We discussed the potential for weight loss. Patient will continue to be active and follow-up with me again once we did have his size in custom orthotics.  Spent  25 minutes with patient face-to-face and had greater than 50% of counseling including as described above in assessment and plan.

## 2016-12-25 ENCOUNTER — Ambulatory Visit (INDEPENDENT_AMBULATORY_CARE_PROVIDER_SITE_OTHER): Payer: BLUE CROSS/BLUE SHIELD | Admitting: Family Medicine

## 2016-12-25 ENCOUNTER — Encounter: Payer: Self-pay | Admitting: Family Medicine

## 2016-12-25 DIAGNOSIS — M7740 Metatarsalgia, unspecified foot: Secondary | ICD-10-CM

## 2016-12-25 NOTE — Progress Notes (Signed)
Procedure Note   Patient was fitted for a : standard, cushioned, semi-rigid orthotic. The orthotic was heated and afterward the patient patient seated position and molded The patient was positioned in subtalar neutral position and 10 degrees of ankle dorsiflexion in a weight bearing stance. After completion of molding, patient did have orthotic management The blank was ground to a stable position for weight bearing. Size:9.5 Base: Carbon fiber, Comfort Additional Posting and Padding: Lateral postings: 200/50x2 bilaterally The patient ambulated these, and they were very comfortable.

## 2016-12-25 NOTE — Assessment & Plan Note (Signed)
Patient was fitted in custom orthotics today. We'll start wearing him 2 hours a day and slowly increase wear over the course of time. We discussed any and come back if any significant changes necessary. Follow-up again in 2-4 weeks.

## 2017-01-02 ENCOUNTER — Other Ambulatory Visit: Payer: Self-pay | Admitting: Internal Medicine

## 2017-01-18 ENCOUNTER — Telehealth: Payer: Self-pay | Admitting: Internal Medicine

## 2017-01-18 ENCOUNTER — Other Ambulatory Visit: Payer: Self-pay | Admitting: Internal Medicine

## 2017-01-18 MED ORDER — OSELTAMIVIR PHOSPHATE 75 MG PO CAPS: 75.0000 mg | ORAL_CAPSULE | Freq: Two times a day (BID) | ORAL | 0 refills | Status: DC

## 2017-01-18 NOTE — Telephone Encounter (Signed)
Advised Brian Ayers rx has been sent

## 2017-01-18 NOTE — Telephone Encounter (Signed)
Routing to dr plotnikov, please advise, thanks 

## 2017-01-18 NOTE — Telephone Encounter (Signed)
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  Would like to know if Dr. Alain Marion could call in a script for tamiflu for patient to have on hand in case he does get the flu.  [facility] [address]  Please follow up with spouse in regard.

## 2017-01-18 NOTE — Telephone Encounter (Signed)
Ok Thx 

## 2017-02-19 ENCOUNTER — Other Ambulatory Visit (INDEPENDENT_AMBULATORY_CARE_PROVIDER_SITE_OTHER): Payer: BLUE CROSS/BLUE SHIELD

## 2017-02-19 DIAGNOSIS — E785 Hyperlipidemia, unspecified: Secondary | ICD-10-CM | POA: Diagnosis not present

## 2017-02-19 DIAGNOSIS — E119 Type 2 diabetes mellitus without complications: Secondary | ICD-10-CM | POA: Diagnosis not present

## 2017-02-19 LAB — LIPID PANEL
CHOLESTEROL: 183 mg/dL (ref 0–200)
HDL: 44.4 mg/dL (ref >39.00)
LDL CALC: 117 mg/dL — AB (ref 0–99)
NonHDL: 138.64
TRIGLYCERIDES: 109 mg/dL (ref 0.0–149.0)
Total CHOL/HDL Ratio: 4
VLDL: 21.8 mg/dL (ref 0.0–40.0)

## 2017-02-19 LAB — BASIC METABOLIC PANEL
BUN: 18 mg/dL (ref 6–23)
CO2: 25 mEq/L (ref 19–32)
CREATININE: 0.94 mg/dL (ref 0.40–1.50)
Calcium: 9.2 mg/dL (ref 8.4–10.5)
Chloride: 106 mEq/L (ref 96–112)
GFR: 85.95 mL/min (ref >60.00)
GLUCOSE: 112 mg/dL — AB (ref 70–99)
Potassium: 4.4 mEq/L (ref 3.5–5.1)
Sodium: 140 mEq/L (ref 135–145)

## 2017-02-19 LAB — HEPATIC FUNCTION PANEL
ALT: 22 U/L (ref 0–53)
AST: 18 U/L (ref 0–37)
Albumin: 4.5 g/dL (ref 3.5–5.2)
Alkaline Phosphatase: 54 U/L (ref 39–117)
Bilirubin, Direct: 0.1 mg/dL (ref 0.0–0.3)
Total Bilirubin: 0.8 mg/dL (ref 0.2–1.2)
Total Protein: 6.8 g/dL (ref 6.0–8.3)

## 2017-02-19 LAB — HEMOGLOBIN A1C: HEMOGLOBIN A1C: 6.6 % — AB (ref 4.6–6.5)

## 2017-02-26 ENCOUNTER — Telehealth: Payer: Self-pay | Admitting: Internal Medicine

## 2017-02-26 NOTE — Telephone Encounter (Signed)
Pt wife called in and pt cholesterol is going up on the new meds and it was going down on the Crestor (name brand)  They would like a letter sent to ins that pt needs to take the name brand not generic .  It is right now $700.00 with out it

## 2017-03-05 ENCOUNTER — Encounter: Payer: Self-pay | Admitting: Internal Medicine

## 2017-03-05 ENCOUNTER — Ambulatory Visit (INDEPENDENT_AMBULATORY_CARE_PROVIDER_SITE_OTHER): Payer: BLUE CROSS/BLUE SHIELD | Admitting: Internal Medicine

## 2017-03-05 VITALS — BP 140/88 | HR 65 | Temp 98.0°F | Resp 16 | Ht 70.0 in | Wt 209.0 lb

## 2017-03-05 DIAGNOSIS — E785 Hyperlipidemia, unspecified: Secondary | ICD-10-CM | POA: Diagnosis not present

## 2017-03-05 DIAGNOSIS — E119 Type 2 diabetes mellitus without complications: Secondary | ICD-10-CM | POA: Diagnosis not present

## 2017-03-05 DIAGNOSIS — K219 Gastro-esophageal reflux disease without esophagitis: Secondary | ICD-10-CM

## 2017-03-05 MED ORDER — ONETOUCH VERIO FLEX SYSTEM W/DEVICE KIT: 1.0000 | PACK | Freq: Once | 0 refills | Status: DC

## 2017-03-05 MED ORDER — GLUCOSE BLOOD VI STRP: ORAL_STRIP | 5 refills | Status: DC

## 2017-03-05 MED ORDER — ROSUVASTATIN CALCIUM 10 MG PO TABS: 10.0000 mg | ORAL_TABLET | Freq: Every day | ORAL | 3 refills | Status: DC

## 2017-03-05 MED ORDER — ONETOUCH DELICA LANCETS 33G MISC: 5 refills | Status: DC

## 2017-03-05 MED ORDER — GLUCOSE BLOOD VI STRP: ORAL_STRIP | 12 refills | Status: DC

## 2017-03-05 NOTE — Progress Notes (Signed)
Pre-visit discussion using our clinic review tool. No additional management support is needed unless otherwise documented below in the visit note.  

## 2017-03-05 NOTE — Assessment & Plan Note (Signed)
Metfornin

## 2017-03-05 NOTE — Progress Notes (Signed)
Subjective:  Patient ID: Brian Ayers, male    DOB: 1951/02/13  Age: 66 y.o. MRN: [id number]  CC: Diabetes (DM type 2)   HPI Brian Ayers presents for DM, Dyslipidemia, GERD  Outpatient Medications Prior to Visit  Medication Sig Dispense Refill  . aspirin 81 MG EC tablet Take 81 mg by mouth daily.      . Azelastine-Fluticasone (DYMISTA) 137-50 MCG/ACT SUSP 1 spr each nostril bid 3 Bottle 3  . Biotin 2500 MCG CAPS Take by mouth.      . clotrimazole-betamethasone (LOTRISONE) cream Apply 1 application topically 2 (two) times daily. 15 g 1  . EPINEPHrine 0.3 mg/0.3 mL IJ SOAJ injection as needed.  0  . Fluticasone-Salmeterol (ADVAIR) 100-50 MCG/DOSE AEPB Inhale 1 puff into the lungs 2 (two) times daily. 180 each 3  . Lancets (ONETOUCH ULTRASOFT) lancets 1 each by Other route 2 (two) times daily. Use to check blood sugars twice a day Dx E11.9 100 each 5  . loratadine (CLARITIN) 10 MG tablet TAKE 1 TABLET (10 MG TOTAL) BY MOUTH DAILY. 100 tablet 2  . metFORMIN (GLUCOPHAGE) 500 MG tablet TAKE 1 TABLET (500 MG TOTAL) BY MOUTH 2 (TWO) TIMES DAILY WITH A MEAL. 180 tablet 1  . oseltamivir (TAMIFLU) 75 MG capsule Take 1 capsule (75 mg total) by mouth 2 (two) times daily. 10 capsule 0  . PROAIR HFA 108 (90 BASE) MCG/ACT inhaler as needed.  0  . ranitidine (ZANTAC) 150 MG tablet TAKE 1 TABLET (150 MG TOTAL) BY MOUTH 2 (TWO) TIMES DAILY. 180 tablet 3  . rosuvastatin (CRESTOR) 10 MG tablet Take 1 tablet (10 mg total) by mouth daily. 90 tablet 3  . Saw Palmetto, Serenoa repens, 450 MG CAPS Take 1 capsule by mouth 2 (two) times daily.    . sildenafil (VIAGRA) 100 MG tablet Take 1 tablet (100 mg total) by mouth as needed for erectile dysfunction. 12 tablet 11  . UNABLE TO FIND Med Name: Takes allergy shots weekly    . Blood Glucose Monitoring Suppl (ONE TOUCH ULTRA 2) w/Device KIT Use as directed to check blood sugars Dx E11.9 1 each 0  . glucose blood (ONE TOUCH ULTRA TEST) test strip 1 each by  Other route 2 (two) times daily. Use to check blood sugars twice a day Dx E11.9 100 each 5   Facility-Administered Medications Prior to Visit  Medication Dose Route Frequency Provider Last Rate Last Dose  . methylPREDNISolone acetate (DEPO-MEDROL) 20 MG/ML injection 10 mg  10 mg Intra-Lesional Once Brian Ayers V, MD        ROS Review of Systems  Constitutional: Negative for appetite change, fatigue and unexpected weight change.  HENT: Negative for congestion, nosebleeds, sneezing, sore throat and trouble swallowing.   Eyes: Negative for itching and visual disturbance.  Respiratory: Negative for cough.   Cardiovascular: Negative for chest pain, palpitations and leg swelling.  Gastrointestinal: Negative for abdominal distention, blood in stool, diarrhea and nausea.  Genitourinary: Negative for frequency and hematuria.  Musculoskeletal: Negative for back pain, gait problem, joint swelling and neck pain.  Skin: Negative for rash.  Neurological: Negative for dizziness, tremors, speech difficulty and weakness.  Psychiatric/Behavioral: Negative for agitation, dysphoric mood and sleep disturbance. The patient is not nervous/anxious.     Objective:  BP 140/88   Pulse 65   Temp 98 F (36.7 C) (Oral)   Resp 16   Ht '5\' 10"'$  (1.778 m)   Wt 209 lb (94.8 kg)  SpO2 98%   BMI 29.99 kg/m   BP Readings from Last 3 Encounters:  03/05/17 140/88  12/14/16 (!) 146/84  11/06/16 (!) 154/84    Wt Readings from Last 3 Encounters:  03/05/17 209 lb (94.8 kg)  12/14/16 203 lb (92.1 kg)  11/06/16 204 lb (92.5 kg)    Physical Exam  Constitutional: He is oriented to person, place, and time. He appears well-developed. No distress.  NAD  HENT:  Mouth/Throat: Oropharynx is clear and moist.  Eyes: Conjunctivae are normal. Pupils are equal, round, and reactive to light.  Neck: Normal range of motion. No JVD present. No thyromegaly present.  Cardiovascular: Normal rate, regular rhythm, normal  heart sounds and intact distal pulses.  Exam reveals no gallop and no friction rub.   No murmur heard. Pulmonary/Chest: Effort normal and breath sounds normal. No respiratory distress. He has no wheezes. He has no rales. He exhibits no tenderness.  Abdominal: Soft. Bowel sounds are normal. He exhibits no distension and no mass. There is no tenderness. There is no rebound and no guarding.  Musculoskeletal: Normal range of motion. He exhibits no edema or tenderness.  Lymphadenopathy:    He has no cervical adenopathy.  Neurological: He is alert and oriented to person, place, and time. He has normal reflexes. No cranial nerve deficit. He exhibits normal muscle tone. He displays a negative Romberg sign. Coordination and gait normal.  Skin: Skin is warm and dry. No rash noted.  Psychiatric: He has a normal mood and affect. His behavior is normal. Judgment and thought content normal.    Lab Results  Component Value Date   WBC 7.1 10/25/2016   HGB 15.1 10/25/2016   HCT 43.8 10/25/2016   PLT 235.0 10/25/2016   GLUCOSE 112 (H) 02/19/2017   CHOL 183 02/19/2017   TRIG 109.0 02/19/2017   HDL 44.40 02/19/2017   LDLCALC 117 (H) 02/19/2017   ALT 22 02/19/2017   AST 18 02/19/2017   NA 140 02/19/2017   K 4.4 02/19/2017   CL 106 02/19/2017   CREATININE 0.94 02/19/2017   BUN 18 02/19/2017   CO2 25 02/19/2017   TSH 3.64 10/25/2016   PSA 0.82 10/25/2016   HGBA1C 6.6 (H) 02/19/2017   MICROALBUR 1.3 10/25/2016    No results found.  Assessment & Plan:   Brian Ayers was seen today for diabetes.  Diagnoses and all orders for this visit:  Controlled type 2 diabetes mellitus without complication, without long-term current use of insulin (HCC) -     Blood Glucose Monitoring Suppl ([place]) w/Device KIT; 1 each by Does not apply route once. -     glucose blood test strip; Use as instructed   I have discontinued Brian Ayers's ONE TOUCH ULTRA 2 and glucose blood. I am also having him  start on [place] and glucose blood. Additionally, I am having him maintain his aspirin, Biotin, UNABLE TO FIND, PROAIR HFA, Saw Palmetto (Serenoa repens), Azelastine-Fluticasone, Fluticasone-Salmeterol, EPINEPHrine, metFORMIN, clotrimazole-betamethasone, sildenafil, rosuvastatin, onetouch ultrasoft, ranitidine, loratadine, and oseltamivir. We will continue to administer methylPREDNISolone acetate.  Meds ordered this encounter  Medications  . Blood Glucose Monitoring Suppl ([place]) w/Device KIT    Sig: 1 each by Does not apply route once.    Dispense:  1 kit    Refill:  0  . glucose blood test strip    Sig: Use as instructed    Dispense:  100 each    Refill:  12  Follow-up: No Follow-up on file.  Walker Kehr, MD

## 2017-03-05 NOTE — Assessment & Plan Note (Signed)
On Crestor 

## 2017-03-06 ENCOUNTER — Ambulatory Visit: Payer: BLUE CROSS/BLUE SHIELD | Admitting: Internal Medicine

## 2017-03-06 NOTE — Assessment & Plan Note (Signed)
Ranitidine bid

## 2017-03-12 NOTE — Telephone Encounter (Signed)
Routing to dr plotnikov, please advise, thanks 

## 2017-03-13 NOTE — Telephone Encounter (Signed)
Pls write a letter  This may not work, however Thx

## 2017-04-16 ENCOUNTER — Other Ambulatory Visit: Payer: Self-pay | Admitting: Internal Medicine

## 2017-04-18 ENCOUNTER — Other Ambulatory Visit: Payer: Self-pay | Admitting: Internal Medicine

## 2017-05-15 ENCOUNTER — Ambulatory Visit: Payer: BLUE CROSS/BLUE SHIELD | Admitting: Family Medicine

## 2017-06-22 ENCOUNTER — Other Ambulatory Visit (INDEPENDENT_AMBULATORY_CARE_PROVIDER_SITE_OTHER): Payer: BLUE CROSS/BLUE SHIELD

## 2017-06-22 DIAGNOSIS — E119 Type 2 diabetes mellitus without complications: Secondary | ICD-10-CM

## 2017-06-22 LAB — HEMOGLOBIN A1C: HEMOGLOBIN A1C: 6.6 % — AB (ref 4.6–6.5)

## 2017-06-22 LAB — BASIC METABOLIC PANEL
BUN: 19 mg/dL (ref 6–23)
CHLORIDE: 105 meq/L (ref 96–112)
CO2: 26 mEq/L (ref 19–32)
Calcium: 9.2 mg/dL (ref 8.4–10.5)
Creatinine, Ser: 0.96 mg/dL (ref 0.40–1.50)
GFR: 83.8 mL/min (ref >60.00)
Glucose, Bld: 139 mg/dL — ABNORMAL HIGH (ref 70–99)
POTASSIUM: 4 meq/L (ref 3.5–5.1)
SODIUM: 139 meq/L (ref 135–145)

## 2017-06-22 LAB — LIPID PANEL
Cholesterol: 140 mg/dL (ref 0–200)
HDL: 51.7 mg/dL (ref >39.00)
LDL Cholesterol: 72 mg/dL (ref 0–99)
NonHDL: 88.23
Total CHOL/HDL Ratio: 3
Triglycerides: 80 mg/dL (ref 0.0–149.0)
VLDL: 16 mg/dL (ref 0.0–40.0)

## 2017-06-24 ENCOUNTER — Other Ambulatory Visit: Payer: Self-pay | Admitting: Internal Medicine

## 2017-07-03 ENCOUNTER — Ambulatory Visit (INDEPENDENT_AMBULATORY_CARE_PROVIDER_SITE_OTHER): Payer: BLUE CROSS/BLUE SHIELD | Admitting: Internal Medicine

## 2017-07-03 ENCOUNTER — Encounter: Payer: Self-pay | Admitting: Internal Medicine

## 2017-07-03 DIAGNOSIS — K219 Gastro-esophageal reflux disease without esophagitis: Secondary | ICD-10-CM

## 2017-07-03 DIAGNOSIS — E119 Type 2 diabetes mellitus without complications: Secondary | ICD-10-CM | POA: Diagnosis not present

## 2017-07-03 DIAGNOSIS — E785 Hyperlipidemia, unspecified: Secondary | ICD-10-CM | POA: Diagnosis not present

## 2017-07-03 NOTE — Assessment & Plan Note (Signed)
Ranitidine 

## 2017-07-03 NOTE — Assessment & Plan Note (Signed)
-   Crestor 

## 2017-07-03 NOTE — Progress Notes (Signed)
Subjective:  Patient ID: Brian Ayers, male    DOB: 25-Jun-1951  Age: 66 y.o. MRN: [id number]  CC: No chief complaint on file.   HPI Brian Ayers presents for DM2, allergies, GED f/u. Form filled out.  Outpatient Medications Prior to Visit  Medication Sig Dispense Refill  . ADVAIR DISKUS 100-50 MCG/DOSE AEPB INHALE 1 PUFF INTO THE LUNGS TWO TIMES DAILY 180 each 1  . aspirin 81 MG EC tablet Take 81 mg by mouth daily.      . Azelastine-Fluticasone (DYMISTA) 137-50 MCG/ACT SUSP 1 spr each nostril bid 3 Bottle 3  . Biotin 2500 MCG CAPS Take by mouth.      . clotrimazole-betamethasone (LOTRISONE) cream Apply 1 application topically 2 (two) times daily. 15 g 1  . EPINEPHrine 0.3 mg/0.3 mL IJ SOAJ injection as needed.  0  . glucose blood (ONE TOUCH ULTRA TEST) test strip Use to check blood sugars twice a day 100 each 5  . loratadine (CLARITIN) 10 MG tablet TAKE 1 TABLET (10 MG TOTAL) BY MOUTH DAILY. 100 tablet 2  . metFORMIN (GLUCOPHAGE) 500 MG tablet TAKE 1 TABLET (500 MG TOTAL) BY MOUTH 2 (TWO) TIMES DAILY WITH A MEAL. 180 tablet 1  . ONETOUCH DELICA LANCETS 64W MISC Use to check blood sugars twice a day Dx E11.9 100 each 5  . oseltamivir (TAMIFLU) 75 MG capsule Take 1 capsule (75 mg total) by mouth 2 (two) times daily. 10 capsule 0  . PROAIR HFA 108 (90 BASE) MCG/ACT inhaler as needed.  0  . ranitidine (ZANTAC) 150 MG tablet TAKE 1 TABLET (150 MG TOTAL) BY MOUTH 2 (TWO) TIMES DAILY. 180 tablet 3  . rosuvastatin (CRESTOR) 10 MG tablet Take 1 tablet (10 mg total) by mouth daily. 90 tablet 3  . Saw Palmetto, Serenoa repens, 450 MG CAPS Take 1 capsule by mouth 2 (two) times daily.    . sildenafil (VIAGRA) 100 MG tablet Take 1 tablet (100 mg total) by mouth as needed for erectile dysfunction. 12 tablet 11  . UNABLE TO FIND Med Name: Takes allergy shots weekly     Facility-Administered Medications Prior to Visit  Medication Dose Route Frequency Provider Last Rate Last Dose  .  methylPREDNISolone acetate (DEPO-MEDROL) 20 MG/ML injection 10 mg  10 mg Intra-Lesional Once Marrisa Kimber, Evie Lacks, MD        ROS Review of Systems  Constitutional: Negative for appetite change, fatigue and unexpected weight change.  HENT: Negative for congestion, nosebleeds, sneezing, sore throat and trouble swallowing.   Eyes: Negative for itching and visual disturbance.  Respiratory: Negative for cough.   Cardiovascular: Negative for chest pain, palpitations and leg swelling.  Gastrointestinal: Negative for abdominal distention, blood in stool, diarrhea and nausea.  Genitourinary: Negative for frequency and hematuria.  Musculoskeletal: Negative for back pain, gait problem, joint swelling and neck pain.  Skin: Negative for rash.  Neurological: Negative for dizziness, tremors, speech difficulty and weakness.  Psychiatric/Behavioral: Negative for agitation, dysphoric mood and sleep disturbance. The patient is not nervous/anxious.     Objective:  BP 136/82 (BP Location: Left Arm, Patient Position: Sitting, Cuff Size: Large)   Pulse 74   Temp 98.5 F (36.9 C) (Oral)   Ht 5\' 10"  (1.778 m)   Wt 207 lb (93.9 kg)   SpO2 99%   BMI 29.70 kg/m   BP Readings from Last 3 Encounters:  07/03/17 136/82  03/05/17 140/88  12/14/16 (!) 146/84    Wt Readings from Last 3  Encounters:  07/03/17 207 lb (93.9 kg)  03/05/17 209 lb (94.8 kg)  12/14/16 203 lb (92.1 kg)    Physical Exam  Constitutional: He is oriented to person, place, and time. He appears well-developed. No distress.  NAD  HENT:  Mouth/Throat: Oropharynx is clear and moist.  Eyes: Pupils are equal, round, and reactive to light. Conjunctivae are normal.  Neck: Normal range of motion. No JVD present. No thyromegaly present.  Cardiovascular: Normal rate, regular rhythm, normal heart sounds and intact distal pulses.  Exam reveals no gallop and no friction rub.   No murmur heard. Pulmonary/Chest: Effort normal and breath sounds  normal. No respiratory distress. He has no wheezes. He has no rales. He exhibits no tenderness.  Abdominal: Soft. Bowel sounds are normal. He exhibits no distension and no mass. There is no tenderness. There is no rebound and no guarding.  Musculoskeletal: Normal range of motion. He exhibits no edema or tenderness.  Lymphadenopathy:    He has no cervical adenopathy.  Neurological: He is alert and oriented to person, place, and time. He has normal reflexes. No cranial nerve deficit. He exhibits normal muscle tone. He displays a negative Romberg sign. Coordination and gait normal.  Skin: Skin is warm and dry. No rash noted.  Psychiatric: He has a normal mood and affect. His behavior is normal. Judgment and thought content normal.    Lab Results  Component Value Date   WBC 7.1 10/25/2016   HGB 15.1 10/25/2016   HCT 43.8 10/25/2016   PLT 235.0 10/25/2016   GLUCOSE 139 (H) 06/22/2017   CHOL 140 06/22/2017   TRIG 80.0 06/22/2017   HDL 51.70 06/22/2017   LDLCALC 72 06/22/2017   ALT 22 02/19/2017   AST 18 02/19/2017   NA 139 06/22/2017   K 4.0 06/22/2017   CL 105 06/22/2017   CREATININE 0.96 06/22/2017   BUN 19 06/22/2017   CO2 26 06/22/2017   TSH 3.64 10/25/2016   PSA 0.82 10/25/2016   HGBA1C 6.6 (H) 06/22/2017   MICROALBUR 1.3 10/25/2016    No results found.  Assessment & Plan:   There are no diagnoses linked to this encounter. I am having [name] maintain his aspirin, Biotin, UNABLE TO FIND, PROAIR HFA, Saw Palmetto (Serenoa repens), Azelastine-Fluticasone, EPINEPHrine, clotrimazole-betamethasone, sildenafil, ranitidine, loratadine, oseltamivir, rosuvastatin, ONETOUCH DELICA LANCETS 67E, metFORMIN, glucose blood, and ADVAIR DISKUS. We will continue to administer methylPREDNISolone acetate.  No orders of the defined types were placed in this encounter.    Follow-up: No Follow-up on file.  Walker Kehr, MD

## 2017-07-03 NOTE — Assessment & Plan Note (Signed)
Metformin 

## 2017-09-10 LAB — HM DIABETES EYE EXAM

## 2017-09-14 ENCOUNTER — Encounter: Payer: Self-pay | Admitting: Internal Medicine

## 2017-10-02 LAB — HM DIABETES EYE EXAM

## 2017-10-06 ENCOUNTER — Other Ambulatory Visit: Payer: Self-pay | Admitting: Internal Medicine

## 2017-10-08 ENCOUNTER — Ambulatory Visit (INDEPENDENT_AMBULATORY_CARE_PROVIDER_SITE_OTHER): Payer: BLUE CROSS/BLUE SHIELD

## 2017-10-08 DIAGNOSIS — Z23 Encounter for immunization: Secondary | ICD-10-CM | POA: Diagnosis not present

## 2017-10-15 ENCOUNTER — Other Ambulatory Visit (INDEPENDENT_AMBULATORY_CARE_PROVIDER_SITE_OTHER): Payer: BLUE CROSS/BLUE SHIELD

## 2017-10-15 DIAGNOSIS — E119 Type 2 diabetes mellitus without complications: Secondary | ICD-10-CM | POA: Diagnosis not present

## 2017-10-15 LAB — BASIC METABOLIC PANEL
BUN: 18 mg/dL (ref 6–23)
CHLORIDE: 106 meq/L (ref 96–112)
CO2: 25 mEq/L (ref 19–32)
CREATININE: 0.9 mg/dL (ref 0.40–1.50)
Calcium: 8.9 mg/dL (ref 8.4–10.5)
GFR: 90.18 mL/min (ref >60.00)
GLUCOSE: 148 mg/dL — AB (ref 70–99)
POTASSIUM: 4.4 meq/L (ref 3.5–5.1)
Sodium: 140 mEq/L (ref 135–145)

## 2017-10-15 LAB — HEMOGLOBIN A1C: Hgb A1c MFr Bld: 7 % — ABNORMAL HIGH (ref 4.6–6.5)

## 2017-10-23 ENCOUNTER — Encounter: Payer: Self-pay | Admitting: Internal Medicine

## 2017-10-23 ENCOUNTER — Ambulatory Visit: Payer: BLUE CROSS/BLUE SHIELD | Admitting: Internal Medicine

## 2017-10-23 DIAGNOSIS — E785 Hyperlipidemia, unspecified: Secondary | ICD-10-CM

## 2017-10-23 DIAGNOSIS — E119 Type 2 diabetes mellitus without complications: Secondary | ICD-10-CM

## 2017-10-23 DIAGNOSIS — K219 Gastro-esophageal reflux disease without esophagitis: Secondary | ICD-10-CM | POA: Diagnosis not present

## 2017-10-23 NOTE — Assessment & Plan Note (Signed)
Ranitidine 

## 2017-10-23 NOTE — Progress Notes (Signed)
Subjective:  Patient ID: Brian Ayers, male    DOB: [birth year]  Age: 66 y.o. MRN: [id number]  CC: No chief complaint on file.   HPI Brian Ayers presents for a DM, HTN, dyslipidemia f/u  Outpatient Medications Prior to Visit  Medication Sig Dispense Refill  . ADVAIR DISKUS 100-50 MCG/DOSE AEPB INHALE 1 PUFF INTO THE LUNGS TWO TIMES DAILY 180 each 1  . aspirin 81 MG EC tablet Take 81 mg by mouth daily.      . Azelastine-Fluticasone (DYMISTA) 137-50 MCG/ACT SUSP 1 spr each nostril bid 3 Bottle 3  . Biotin 2500 MCG CAPS Take by mouth.      . clotrimazole-betamethasone (LOTRISONE) cream Apply 1 application topically 2 (two) times daily. 15 g 1  . EPINEPHrine 0.3 mg/0.3 mL IJ SOAJ injection as needed.  0  . glucose blood (ONE TOUCH ULTRA TEST) test strip Use to check blood sugars twice a day 100 each 5  . loratadine (CLARITIN) 10 MG tablet TAKE 1 TABLET (10 MG TOTAL) BY MOUTH DAILY. 100 tablet 2  . metFORMIN (GLUCOPHAGE) 500 MG tablet TAKE 1 TABLET (500 MG TOTAL) BY MOUTH 2 (TWO) TIMES DAILY WITH A MEAL. 180 tablet 1  . ONETOUCH DELICA LANCETS 32D MISC Use to check blood sugars twice a day Dx E11.9 100 each 5  . oseltamivir (TAMIFLU) 75 MG capsule Take 1 capsule (75 mg total) by mouth 2 (two) times daily. 10 capsule 0  . PROAIR HFA 108 (90 BASE) MCG/ACT inhaler as needed.  0  . ranitidine (ZANTAC) 150 MG tablet TAKE 1 TABLET (150 MG TOTAL) BY MOUTH 2 (TWO) TIMES DAILY. 180 tablet 3  . rosuvastatin (CRESTOR) 10 MG tablet Take 1 tablet (10 mg total) by mouth daily. 90 tablet 3  . Saw Palmetto, Serenoa repens, 450 MG CAPS Take 1 capsule by mouth 2 (two) times daily.    . sildenafil (VIAGRA) 100 MG tablet Take 1 tablet (100 mg total) by mouth as needed for erectile dysfunction. 12 tablet 11  . UNABLE TO FIND Med Name: Takes allergy shots weekly     Facility-Administered Medications Prior to Visit  Medication Dose Route Frequency Provider Last Rate Last Dose  . methylPREDNISolone acetate  (DEPO-MEDROL) 20 MG/ML injection 10 mg  10 mg Intra-Lesional Once Plotnikov, Evie Lacks, MD        ROS Review of Systems  Constitutional: Positive for unexpected weight change. Negative for appetite change and fatigue.  HENT: Negative for congestion, nosebleeds, sneezing, sore throat and trouble swallowing.   Eyes: Negative for itching and visual disturbance.  Respiratory: Negative for cough.   Cardiovascular: Negative for chest pain, palpitations and leg swelling.  Gastrointestinal: Negative for abdominal distention, blood in stool, diarrhea and nausea.  Genitourinary: Negative for frequency and hematuria.  Musculoskeletal: Negative for back pain, gait problem, joint swelling and neck pain.  Skin: Negative for rash.  Neurological: Negative for dizziness, tremors, speech difficulty and weakness.  Psychiatric/Behavioral: Negative for agitation, dysphoric mood and sleep disturbance. The patient is not nervous/anxious.     Objective:  BP (!) 146/84 (BP Location: Left Arm, Patient Position: Sitting, Cuff Size: Large)   Pulse 62   Temp 98.6 F (37 C) (Oral)   Ht 5\' 10"  (1.778 m)   Wt 212 lb (96.2 kg)   SpO2 99%   BMI 30.42 kg/m   BP Readings from Last 3 Encounters:  10/23/17 (!) 146/84  07/03/17 136/82  03/05/17 140/88    Wt Readings from Last  3 Encounters:  10/23/17 212 lb (96.2 kg)  07/03/17 207 lb (93.9 kg)  03/05/17 209 lb (94.8 kg)    Physical Exam  Constitutional: He is oriented to person, place, and time. He appears well-developed. No distress.  NAD  HENT:  Mouth/Throat: Oropharynx is clear and moist.  Eyes: Conjunctivae are normal. Pupils are equal, round, and reactive to light.  Neck: Normal range of motion. No JVD present. No thyromegaly present.  Cardiovascular: Normal rate, regular rhythm, normal heart sounds and intact distal pulses. Exam reveals no gallop and no friction rub.  No murmur heard. Pulmonary/Chest: Effort normal and breath sounds normal. No  respiratory distress. He has no wheezes. He has no rales. He exhibits no tenderness.  Abdominal: Soft. Bowel sounds are normal. He exhibits no distension and no mass. There is no tenderness. There is no rebound and no guarding.  Musculoskeletal: Normal range of motion. He exhibits no edema or tenderness.  Lymphadenopathy:    He has no cervical adenopathy.  Neurological: He is alert and oriented to person, place, and time. He has normal reflexes. No cranial nerve deficit. He exhibits normal muscle tone. He displays a negative Romberg sign. Coordination and gait normal.  Skin: Skin is warm and dry. No rash noted.  Psychiatric: He has a normal mood and affect. His behavior is normal. Judgment and thought content normal.    Lab Results  Component Value Date   WBC 7.1 10/25/2016   HGB 15.1 10/25/2016   HCT 43.8 10/25/2016   PLT 235.0 10/25/2016   GLUCOSE 148 (H) 10/15/2017   CHOL 140 06/22/2017   TRIG 80.0 06/22/2017   HDL 51.70 06/22/2017   LDLCALC 72 06/22/2017   ALT 22 02/19/2017   AST 18 02/19/2017   NA 140 10/15/2017   K 4.4 10/15/2017   CL 106 10/15/2017   CREATININE 0.90 10/15/2017   BUN 18 10/15/2017   CO2 25 10/15/2017   TSH 3.64 10/25/2016   PSA 0.82 10/25/2016   HGBA1C 7.0 (H) 10/15/2017   MICROALBUR 1.3 10/25/2016    No results found.  Assessment & Plan:   There are no diagnoses linked to this encounter. I am having Brian Ayers maintain his aspirin, Biotin, UNABLE TO FIND, PROAIR HFA, Saw Palmetto (Serenoa repens), Azelastine-Fluticasone, EPINEPHrine, clotrimazole-betamethasone, sildenafil, ranitidine, loratadine, oseltamivir, rosuvastatin, ONETOUCH DELICA LANCETS 63K, glucose blood, ADVAIR DISKUS, and metFORMIN. We will continue to administer methylPREDNISolone acetate.  No orders of the defined types were placed in this encounter.    Follow-up: No Follow-up on file.  Walker Kehr, MD

## 2017-10-23 NOTE — Assessment & Plan Note (Signed)
On Crestor 

## 2017-10-23 NOTE — Assessment & Plan Note (Signed)
Metformin 

## 2017-11-02 ENCOUNTER — Ambulatory Visit: Payer: BLUE CROSS/BLUE SHIELD | Admitting: Internal Medicine

## 2017-11-06 ENCOUNTER — Ambulatory Visit: Payer: BLUE CROSS/BLUE SHIELD | Admitting: Internal Medicine

## 2017-12-02 ENCOUNTER — Other Ambulatory Visit: Payer: Self-pay

## 2017-12-02 ENCOUNTER — Encounter (HOSPITAL_BASED_OUTPATIENT_CLINIC_OR_DEPARTMENT_OTHER): Payer: Self-pay | Admitting: Emergency Medicine

## 2017-12-02 ENCOUNTER — Emergency Department (HOSPITAL_BASED_OUTPATIENT_CLINIC_OR_DEPARTMENT_OTHER): Payer: BLUE CROSS/BLUE SHIELD

## 2017-12-02 ENCOUNTER — Emergency Department (HOSPITAL_BASED_OUTPATIENT_CLINIC_OR_DEPARTMENT_OTHER)
Admission: EM | Admit: 2017-12-02 | Discharge: 2017-12-02 | Disposition: A | Discharge status: Home / Self Care | Payer: BLUE CROSS/BLUE SHIELD | Attending: Emergency Medicine | Admitting: Emergency Medicine

## 2017-12-02 DIAGNOSIS — Y9301 Activity, walking, marching and hiking: Secondary | ICD-10-CM | POA: Diagnosis not present

## 2017-12-02 DIAGNOSIS — Z7982 Long term (current) use of aspirin: Secondary | ICD-10-CM | POA: Diagnosis not present

## 2017-12-02 DIAGNOSIS — S5002XA Contusion of left elbow, initial encounter: Secondary | ICD-10-CM | POA: Diagnosis not present

## 2017-12-02 DIAGNOSIS — Z79899 Other long term (current) drug therapy: Secondary | ICD-10-CM | POA: Diagnosis not present

## 2017-12-02 DIAGNOSIS — Z87891 Personal history of nicotine dependence: Secondary | ICD-10-CM | POA: Diagnosis not present

## 2017-12-02 DIAGNOSIS — Z7984 Long term (current) use of oral hypoglycemic drugs: Secondary | ICD-10-CM | POA: Insufficient documentation

## 2017-12-02 DIAGNOSIS — Y998 Other external cause status: Secondary | ICD-10-CM | POA: Insufficient documentation

## 2017-12-02 DIAGNOSIS — E119 Type 2 diabetes mellitus without complications: Secondary | ICD-10-CM | POA: Insufficient documentation

## 2017-12-02 DIAGNOSIS — Y92480 Sidewalk as the place of occurrence of the external cause: Secondary | ICD-10-CM | POA: Insufficient documentation

## 2017-12-02 DIAGNOSIS — J45909 Unspecified asthma, uncomplicated: Secondary | ICD-10-CM | POA: Diagnosis not present

## 2017-12-02 DIAGNOSIS — W010XXA Fall on same level from slipping, tripping and stumbling without subsequent striking against object, initial encounter: Secondary | ICD-10-CM | POA: Diagnosis not present

## 2017-12-02 DIAGNOSIS — S59902A Unspecified injury of left elbow, initial encounter: Secondary | ICD-10-CM | POA: Diagnosis present

## 2017-12-02 NOTE — ED Provider Notes (Signed)
[facility] EMERGENCY DEPARTMENT Provider Note   CSN: [id number] Arrival date & time: 12/02/17  1136     History   Chief Complaint Chief Complaint  Patient presents with  . Fall    HPI AARIN BLUETT is a 66 y.o. male.  The history is provided by the patient.  Fall  This is a new (pt was walking out of church and there was a dip in the sidewalk and he fell on his right elbow) problem. The current episode started 1 to 2 hours ago. The problem occurs constantly. The problem has been gradually improving. Associated symptoms comments: Right elbow pain.  Minimal tingling in the 4th and 5th right fingers.  Pain now only with bending. The symptoms are aggravated by bending. The symptoms are relieved by rest. He has tried rest for the symptoms. The treatment provided mild relief.    Past Medical History:  Diagnosis Date  . Alcohol abuse    hixtory of  . Allergy   . Asthma childhood  . Hx of colonic polyps   . Hyperlipidemia   . Rhinitis    on allery shots    Patient Active Problem List   Diagnosis Date Noted  . Erectile dysfunction 11/06/2016  . Tinea corporis 11/06/2016  . Asthma, chronic 03/13/2016  . Trigger finger, acquired 01/12/2015  . Elevated BP 09/09/2014  . Metatarsalgia 08/10/2014  . Diabetes type 2, controlled ([place]) 06/23/2014  . Well adult exam 05/25/2014  . GERD (gastroesophageal reflux disease) 05/25/2014  . Elevated blood pressure reading without diagnosis of hypertension 11/01/2012  . Routine health maintenance 09/14/2011  . COLONIC POLYPS, HX OF 05/17/2008  . PLANTAR FASCIITIS, BILATERAL 05/15/2008  . ILIOTIBIAL BAND SYNDROME, LEFT KNEE 05/15/2008  . TALIPES CAVUS 05/15/2008  . ALCOHOL ABUSE, IN REMISSION, HX OF 04/28/2008  . Dyslipidemia 09/16/2007  . RHINITIS 09/16/2007    Past Surgical History:  Procedure Laterality Date  . hernia with hydrocele  1973  . left shoulder pin setting  1978  . TONSILLECTOMY  1967  . [place]  EXTRACTION  2004       Home Medications    Prior to Admission medications   Medication Sig Start Date End Date Taking? Authorizing Provider  ADVAIR DISKUS 100-50 MCG/DOSE AEPB INHALE 1 PUFF INTO THE LUNGS TWO TIMES DAILY 06/25/17   Plotnikov, Evie Lacks, MD  aspirin 81 MG EC tablet Take 81 mg by mouth daily.      [provider]  Azelastine-Fluticasone [facility]) 137-50 MCG/ACT SUSP 1 spr each nostril bid 11/01/15   Plotnikov, Evie Lacks, MD  Biotin 2500 MCG CAPS Take by mouth.      [provider]  EPINEPHrine 0.3 mg/0.3 mL IJ SOAJ injection as needed. 04/03/16   [provider]  glucose blood (ONE TOUCH ULTRA TEST) test strip Use to check blood sugars twice a day 04/18/17   Plotnikov, Evie Lacks, MD  loratadine (CLARITIN) 10 MG tablet TAKE 1 TABLET (10 MG TOTAL) BY MOUTH DAILY. 01/18/17   Plotnikov, Evie Lacks, MD  metFORMIN (GLUCOPHAGE) 500 MG tablet TAKE 1 TABLET (500 MG TOTAL) BY MOUTH 2 (TWO) TIMES DAILY WITH A MEAL. 10/08/17   Plotnikov, Evie Lacks, MD  [facility] DELICA LANCETS 34L MISC Use to check blood sugars twice a day Dx E11.9 03/05/17   Plotnikov, Evie Lacks, MD  oseltamivir (TAMIFLU) 75 MG capsule Take 1 capsule (75 mg total) by mouth 2 (two) times daily. 01/18/17   Plotnikov, Evie Lacks, MD  PROAIR [facility]  108 (90 BASE) MCG/ACT inhaler as needed. 02/26/15   [provider]  ranitidine (ZANTAC) 150 MG tablet TAKE 1 TABLET (150 MG TOTAL) BY MOUTH 2 (TWO) TIMES DAILY. 01/02/17   Plotnikov, Evie Lacks, MD  rosuvastatin (CRESTOR) 10 MG tablet Take 1 tablet (10 mg total) by mouth daily. 03/05/17   Plotnikov, Evie Lacks, MD  Saw Palmetto, Serenoa repens, 450 MG CAPS Take 1 capsule by mouth 2 (two) times daily.    [provider]  sildenafil (VIAGRA) 100 MG tablet Take 1 tablet (100 mg total) by mouth as needed for erectile dysfunction. 11/06/16 11/06/17  Plotnikov, Evie Lacks, MD  UNABLE TO FIND Med Name: Takes allergy shots weekly    [provider]     Family History Family History  Problem Relation Age of Onset  . Cancer Mother        breast (hx of)  . Other Mother        trigeminal neuralgia s/p gamma knife/ CHF  . Hypertension Father   . Diabetes Neg Hx   . Colon cancer Neg Hx     Social History Social History   Tobacco Use  . Smoking status: Former Smoker    Last attempt to quit: 12/18/1985    Years since quitting: 31.9  . Smokeless tobacco: Former Systems developer    Quit date: 12/18/1985  . Tobacco comment: only for a short period in the 1980's  Substance Use Topics  . Alcohol use: No    Comment: history of ETOH abuse  . Drug use: No     Allergies   Pseudoephedrine   Review of Systems Review of Systems  All other systems reviewed and are negative.    Physical Exam Updated Vital Signs BP (!) 171/86   Pulse (!) 59   Temp 98.7 F (37.1 C) (Oral)   Resp 20   Ht 5\' 10"  (1.778 m)   Wt 93 kg (205 lb)   SpO2 98%   BMI 29.41 kg/m   Physical Exam  Constitutional: He is oriented to person, place, and time. He appears well-developed and well-nourished. No distress.  HENT:  Head: Normocephalic and atraumatic.  Eyes: EOM are normal. Pupils are equal, round, and reactive to light.  Cardiovascular: Normal rate.  Pulmonary/Chest: Effort normal.  Musculoskeletal: He exhibits tenderness.       Right elbow: He exhibits normal range of motion, no swelling and no effusion. Tenderness found. Olecranon process tenderness noted.       Arms: Neurological: He is alert and oriented to person, place, and time.  5/5 grip strength in right hand.  Normal sensation  Nursing note and vitals reviewed.    ED Treatments / Results  Labs (all labs ordered are listed, but only abnormal results are displayed) Labs Reviewed - No data to display  EKG  EKG Interpretation None       Radiology Dg Elbow Complete Right  Result Date: 12/02/2017 CLINICAL DATA:  66 year old male status post fall today.  Pain. EXAM: RIGHT ELBOW -  COMPLETE 3+ VIEW COMPARISON:  None. FINDINGS: Degenerative spurring about the coronoid of the ulna. No joint effusion identified. Posterior superficial soft tissue swelling. Multiple small dystrophic appearing calcifications underlying the area of swelling. The olecranon appears intact. Radial head appears intact with mild degenerative spurring. No distal humerus fracture identified. IMPRESSION: Soft tissue swelling and degenerative changes with no definite acute fracture or dislocation about the right elbow. Electronically Signed   By: Genevie Ann M.D.   On: 12/02/2017  12:47    Procedures Procedures (including critical care time)  Medications Ordered in ED Medications - No data to display   Initial Impression / Assessment and Plan / ED Course  I have reviewed the triage vital signs and the nursing notes.  Pertinent labs & imaging results that were available during my care of the patient were reviewed by me and considered in my medical decision making (see chart for details).     Pt with mechanical fall and pain over the olecranon process worse with movement.  No nerve palsy identified or deformity.  Pt is able to range but painful.  X-ray pending.  1:15 PM Imaging neg and pt d/ced home.  Final Clinical Impressions(s) / ED Diagnoses   Final diagnoses:  Contusion of left elbow, initial encounter    ED Discharge Orders    None       Blanchie Dessert, MD 12/02/17 1315

## 2017-12-02 NOTE — ED Triage Notes (Signed)
Pt c/o pain to RT elbow s/p fall on grass today; no LOC

## 2017-12-11 ENCOUNTER — Other Ambulatory Visit: Payer: Self-pay | Admitting: Internal Medicine

## 2018-01-01 ENCOUNTER — Telehealth: Payer: Self-pay | Admitting: Internal Medicine

## 2018-01-01 MED ORDER — RANITIDINE HCL 150 MG PO TABS: 150.0000 mg | ORAL_TABLET | Freq: Two times a day (BID) | ORAL | 3 refills | Status: DC

## 2018-01-01 NOTE — Telephone Encounter (Signed)
Copied from [place] [id number]. Topic: Quick Communication - Rx Refill/Question >> Jan 01, 2018  1:35 PM Carolyn Stare wrote: Medication  ranitidine (ZANTAC) 150 MG tablet   Has the patient contacted their pharmacy  yes pharmacy calling asking for 90 day supply  Preferred Pharmacy (with phone number or street name    [facility]    Agent: Please be advised that RX refills may take up to 3 business days. We ask that you follow-up with your pharmacy.

## 2018-01-01 NOTE — Telephone Encounter (Signed)
OV: 10/23/17 LR: 01/02/17 #180 3 RF Refilled for 1 year

## 2018-01-07 ENCOUNTER — Other Ambulatory Visit (INDEPENDENT_AMBULATORY_CARE_PROVIDER_SITE_OTHER): Payer: BLUE CROSS/BLUE SHIELD

## 2018-01-07 DIAGNOSIS — E119 Type 2 diabetes mellitus without complications: Secondary | ICD-10-CM | POA: Diagnosis not present

## 2018-01-07 LAB — BASIC METABOLIC PANEL
BUN: 15 mg/dL (ref 6–23)
CO2: 28 mEq/L (ref 19–32)
Calcium: 9.4 mg/dL (ref 8.4–10.5)
Chloride: 103 mEq/L (ref 96–112)
Creatinine, Ser: 0.86 mg/dL (ref 0.40–1.50)
GFR: 94.97 mL/min (ref >60.00)
Glucose, Bld: 139 mg/dL — ABNORMAL HIGH (ref 70–99)
POTASSIUM: 4 meq/L (ref 3.5–5.1)
SODIUM: 139 meq/L (ref 135–145)

## 2018-01-07 LAB — HEMOGLOBIN A1C: Hgb A1c MFr Bld: 7.3 % — ABNORMAL HIGH (ref 4.6–6.5)

## 2018-01-08 ENCOUNTER — Encounter: Payer: Self-pay | Admitting: Internal Medicine

## 2018-01-15 ENCOUNTER — Ambulatory Visit: Payer: BLUE CROSS/BLUE SHIELD | Admitting: Internal Medicine

## 2018-01-15 ENCOUNTER — Encounter: Payer: Self-pay | Admitting: Internal Medicine

## 2018-01-15 DIAGNOSIS — J4520 Mild intermittent asthma, uncomplicated: Secondary | ICD-10-CM | POA: Diagnosis not present

## 2018-01-15 DIAGNOSIS — K219 Gastro-esophageal reflux disease without esophagitis: Secondary | ICD-10-CM

## 2018-01-15 DIAGNOSIS — R030 Elevated blood-pressure reading, without diagnosis of hypertension: Secondary | ICD-10-CM

## 2018-01-15 MED ORDER — ROSUVASTATIN CALCIUM 10 MG PO TABS: 10.0000 mg | ORAL_TABLET | Freq: Every day | ORAL | 3 refills | Status: DC

## 2018-01-15 MED ORDER — METFORMIN HCL 500 MG PO TABS: ORAL_TABLET | ORAL | 3 refills | Status: DC

## 2018-01-15 NOTE — Assessment & Plan Note (Signed)
Ranitidine 

## 2018-01-15 NOTE — Assessment & Plan Note (Addendum)
BP Readings from Last 3 Encounters:  01/15/18 140/84  12/02/17 (!) 171/86  10/23/17 (!) 146/84   Nl BP at home

## 2018-01-15 NOTE — Assessment & Plan Note (Signed)
Worse Loose wt Increase metformin

## 2018-01-15 NOTE — Patient Instructions (Signed)
Wt Readings from Last 3 Encounters:  01/15/18 211 lb (95.7 kg)  12/02/17 205 lb (93 kg)  10/23/17 212 lb (96.2 kg)

## 2018-01-15 NOTE — Assessment & Plan Note (Signed)
Doing well 

## 2018-01-15 NOTE — Progress Notes (Signed)
Subjective:  Patient ID: Brian Ayers, male    DOB: [birth year]  Age: 67 y.o. MRN: [id number]  CC: No chief complaint on file.   HPI Brian Ayers presents for   Outpatient Medications Prior to Visit  Medication Sig Dispense Refill  . ADVAIR DISKUS 100-50 MCG/DOSE AEPB INHALE 1 PUFF INTO THE LUNGS TWO TIMES DAILY 180 each 1  . aspirin 81 MG EC tablet Take 81 mg by mouth daily.      . Azelastine-Fluticasone (DYMISTA) 137-50 MCG/ACT SUSP 1 spr each nostril bid 3 Bottle 3  . Biotin 2500 MCG CAPS Take by mouth.      . EPINEPHrine 0.3 mg/0.3 mL IJ SOAJ injection as needed.  0  . glucose blood (ONE TOUCH ULTRA TEST) test strip Use to check blood sugars twice a day 100 each 5  . loratadine (CLARITIN) 10 MG tablet TAKE 1 TABLET (10 MG TOTAL) BY MOUTH DAILY. 100 tablet 2  . metFORMIN (GLUCOPHAGE) 500 MG tablet TAKE 1 TABLET (500 MG TOTAL) BY MOUTH 2 (TWO) TIMES DAILY WITH A MEAL. 180 tablet 1  . ONETOUCH DELICA LANCETS FINE MISC USE TO CHECK BS TWICE A DAY 100 each 5  . oseltamivir (TAMIFLU) 75 MG capsule Take 1 capsule (75 mg total) by mouth 2 (two) times daily. 10 capsule 0  . PROAIR HFA 108 (90 BASE) MCG/ACT inhaler as needed.  0  . ranitidine (ZANTAC) 150 MG tablet Take 1 tablet (150 mg total) by mouth 2 (two) times daily. 180 tablet 3  . rosuvastatin (CRESTOR) 10 MG tablet Take 1 tablet (10 mg total) by mouth daily. 90 tablet 3  . Saw Palmetto, Serenoa repens, 450 MG CAPS Take 1 capsule by mouth 2 (two) times daily.    Marland Kitchen UNABLE TO FIND Med Name: Takes allergy shots weekly    . sildenafil (VIAGRA) 100 MG tablet Take 1 tablet (100 mg total) by mouth as needed for erectile dysfunction. 12 tablet 11   No facility-administered medications prior to visit.     ROS Review of Systems  Constitutional: Positive for unexpected weight change. Negative for appetite change and fatigue.  HENT: Negative for congestion, nosebleeds, sneezing, sore throat and trouble swallowing.   Eyes: Negative for  itching and visual disturbance.  Respiratory: Negative for cough.   Cardiovascular: Negative for chest pain, palpitations and leg swelling.  Gastrointestinal: Negative for abdominal distention, blood in stool, diarrhea and nausea.  Genitourinary: Negative for frequency and hematuria.  Musculoskeletal: Negative for back pain, gait problem, joint swelling and neck pain.  Skin: Negative for rash.  Neurological: Negative for dizziness, tremors, speech difficulty and weakness.  Psychiatric/Behavioral: Negative for agitation, dysphoric mood, sleep disturbance and suicidal ideas. The patient is not nervous/anxious.     Objective:  BP 140/84 (BP Location: Left Arm, Patient Position: Sitting, Cuff Size: Large)   Pulse 67   Temp 98.5 F (36.9 C) (Oral)   Ht 5\' 10"  (1.778 m)   Wt 211 lb (95.7 kg)   SpO2 99%   BMI 30.28 kg/m   BP Readings from Last 3 Encounters:  01/15/18 140/84  12/02/17 (!) 171/86  10/23/17 (!) 146/84    Wt Readings from Last 3 Encounters:  01/15/18 211 lb (95.7 kg)  12/02/17 205 lb (93 kg)  10/23/17 212 lb (96.2 kg)    Physical Exam  Constitutional: He is oriented to person, place, and time. He appears well-developed. No distress.  NAD  HENT:  Mouth/Throat: Oropharynx is clear and moist.  Eyes: Conjunctivae are normal. Pupils are equal, round, and reactive to light.  Neck: Normal range of motion. No JVD present. No thyromegaly present.  Cardiovascular: Normal rate, regular rhythm, normal heart sounds and intact distal pulses. Exam reveals no gallop and no friction rub.  No murmur heard. Pulmonary/Chest: Effort normal and breath sounds normal. No respiratory distress. He has no wheezes. He has no rales. He exhibits no tenderness.  Abdominal: Soft. Bowel sounds are normal. He exhibits no distension and no mass. There is no tenderness. There is no rebound and no guarding.  Musculoskeletal: Normal range of motion. He exhibits no edema or tenderness.  Lymphadenopathy:     He has no cervical adenopathy.  Neurological: He is alert and oriented to person, place, and time. He has normal reflexes. No cranial nerve deficit. He exhibits normal muscle tone. He displays a negative Romberg sign. Coordination and gait normal.  Skin: Skin is warm and dry. No rash noted.  Psychiatric: He has a normal mood and affect. His behavior is normal. Judgment and thought content normal.    Lab Results  Component Value Date   WBC 7.1 10/25/2016   HGB 15.1 10/25/2016   HCT 43.8 10/25/2016   PLT 235.0 10/25/2016   GLUCOSE 139 (H) 01/07/2018   CHOL 140 06/22/2017   TRIG 80.0 06/22/2017   HDL 51.70 06/22/2017   LDLCALC 72 06/22/2017   ALT 22 02/19/2017   AST 18 02/19/2017   NA 139 01/07/2018   K 4.0 01/07/2018   CL 103 01/07/2018   CREATININE 0.86 01/07/2018   BUN 15 01/07/2018   CO2 28 01/07/2018   TSH 3.64 10/25/2016   PSA 0.82 10/25/2016   HGBA1C 7.3 (H) 01/07/2018   MICROALBUR 1.3 10/25/2016    Dg Elbow Complete Right  Result Date: 12/02/2017 CLINICAL DATA:  67 year old male status post fall today.  Pain. EXAM: RIGHT ELBOW - COMPLETE 3+ VIEW COMPARISON:  None. FINDINGS: Degenerative spurring about the coronoid of the ulna. No joint effusion identified. Posterior superficial soft tissue swelling. Multiple small dystrophic appearing calcifications underlying the area of swelling. The olecranon appears intact. Radial head appears intact with mild degenerative spurring. No distal humerus fracture identified. IMPRESSION: Soft tissue swelling and degenerative changes with no definite acute fracture or dislocation about the right elbow. Electronically Signed   By: Genevie Ann M.D.   On: 12/02/2017 12:47    Assessment & Plan:   There are no diagnoses linked to this encounter. I am having [name] maintain his aspirin, Biotin, UNABLE TO FIND, PROAIR HFA, Saw Palmetto (Serenoa repens), Azelastine-Fluticasone, EPINEPHrine, sildenafil, loratadine, oseltamivir,  rosuvastatin, glucose blood, ADVAIR DISKUS, metFORMIN, ONETOUCH DELICA LANCETS FINE, and ranitidine.  No orders of the defined types were placed in this encounter.    Follow-up: No Follow-up on file.  Walker Kehr, MD

## 2018-03-06 ENCOUNTER — Other Ambulatory Visit: Payer: Self-pay | Admitting: Urology

## 2018-03-06 ENCOUNTER — Encounter (HOSPITAL_COMMUNITY): Payer: Self-pay | Admitting: General Practice

## 2018-03-09 ENCOUNTER — Other Ambulatory Visit: Payer: Self-pay | Admitting: Internal Medicine

## 2018-03-11 ENCOUNTER — Other Ambulatory Visit: Payer: Self-pay | Admitting: Internal Medicine

## 2018-03-13 NOTE — H&P (Signed)
Office Visit Report     03/01/2018   --------------------------------------------------------------------------------   Brian Ayers  MRN: [id number]  PRIMARY CARE:  Evie Lacks. Plotnikov, MD  DOB: [birth year], 67 year old Male  REFERRING:  [name], MD  SSN:   PROVIDER:  Kathie Rhodes, M.D.    TREATING:  Raynelle Bring, M.D.    LOCATION:  [facility] [phone]   --------------------------------------------------------------------------------   CC/HPI: Kidney stone   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 He presented to the local emergency department and underwent an evaluation. His urinalysis did demonstrate hematuria. Serum creatinine was 1.1. His white blood count was normal. He did have a CT scan performed that confirmed a 5 mm right mid ureteral calculus with associated hydronephrosis. No other calculi were identified. He brings his CT scan from [facility] today. He has denied any fever. His pain has been relatively well controlled with oral medication. He is straining his urine.     ALLERGIES: None   MEDICATIONS: Aspirin  Crestor  Metformin Hcl  Saw Palmetto  Vitamin D3     GU PSH: Remove Hydrocele    NON-GU PSH: Laser Surgery Eye Shoulder Arthroscopy/surgery, Right Tonsillectomy    GU PMH: None   NON-GU PMH: Diabetes Type 2 Hypercholesterolemia    FAMILY HISTORY: Cancer - Mother Heart Disease - Mother, Father   SOCIAL HISTORY: Marital Status: Married Preferred Language: English Current Smoking Status: Patient has never smoked.   Tobacco Use Assessment Completed: Used Tobacco in last 30 days? Has never drank.  Drinks 2 caffeinated drinks per day.    REVIEW OF SYSTEMS:    GU Review Male:   Patient reports get up at night to urinate. Patient denies frequent urination,  hard to postpone urination, burning/ pain with urination, leakage of urine, stream starts and stops, and trouble starting your streams.  Gastrointestinal (Upper):   Patient reports nausea and vomiting.   Gastrointestinal (Lower):   Patient denies diarrhea and constipation.  Constitutional:   Patient denies fever, night sweats, weight loss, and fatigue.  Skin:   Patient denies skin rash/ lesion and itching.  Eyes:   Patient reports blurred vision. Patient denies double vision.  Ears/ Nose/ Throat:   Patient denies sore throat and sinus problems.  Hematologic/Lymphatic:   Patient denies swollen glands and easy bruising.  Cardiovascular:   Patient denies leg swelling and chest pains.  Respiratory:   Patient denies cough and shortness of breath.  Endocrine:   Patient denies excessive thirst.  Musculoskeletal:   Patient denies back pain and joint pain.  Neurological:   Patient denies headaches and dizziness.  Psychologic:   Patient denies depression and anxiety.   VITAL SIGNS:      03/01/2018 09:37 AM  Weight 209.8 lb / 95.16 kg  Height 700 in / 1778 cm  BP 157/83 mmHg  Pulse 65 /min  Temperature 98.2 F / 36.7 C  BMI 0.3 kg/m   MULTI-SYSTEM PHYSICAL EXAMINATION:    Constitutional: Well-nourished. No physical deformities. Normally developed. Good grooming.  Neck: Neck symmetrical, not swollen. Normal tracheal position.  Respiratory: No labored breathing, no use of accessory muscles. Normal breath sounds. Clear bilaterally.  Cardiovascular: Regular rate and rhythm. No murmur, no gallop. Normal temperature, normal extremity pulses, no swelling, no varicosities.  Lymphatic: No enlargement of neck, axillae, groin.  Skin: No paleness, no  jaundice, no cyanosis. No lesion, no ulcer, no rash.  Neurologic / Psychiatric: Oriented to time, oriented to place, oriented to person. No depression, no anxiety, no agitation.  Gastrointestinal: No mass, no tenderness, no rigidity, non obese abdomen. No CVA  tenderness.  Eyes: Normal conjunctivae. Normal eyelids.  Ears, Nose, Mouth, and Throat: Left ear no scars, no lesions, no masses. Right ear no scars, no lesions, no masses. Nose no scars, no lesions, no masses. Normal hearing. Normal lips.  Musculoskeletal: Normal gait and station of head and neck.     PAST DATA REVIEWED:  Source Of History:  Patient  Records Review:   Previous Patient Records  Urine Test Review:   Urinalysis  X-Ray Review: KUB: Reviewed Films.  C.T. Abdomen/Pelvis: Reviewed Films.    Notes:                     Recent serum creatinine was 1.1.   I independently reviewed his KUB x-ray today. This demonstrates no calcifications over the renal shadows. There is a calcification located next to the L3 vertebrae measuring approximately 5-6 mm consistent with his known calculus. He also has multiple pelvic phleboliths, mostly on the right.   PROCEDURES:         KUB - [id number]  A single view of the abdomen is obtained.               Urinalysis w/Scope - 81001 Dipstick Dipstick Cont'd Micro  Specimen: Voided Bilirubin: Neg WBC/hpf: NS (Not Seen)  Color: Yellow Ketones: Trace RBC/hpf: 20 - 40/hpf  Appearance: Clear Blood: 3+ Bacteria: NS (Not Seen)  Specific Gravity: >1.030 Protein: 1+ Cystals: Ca Oxalate  pH: 5.5 Urobilinogen: 1.0 Casts: NS (Not Seen)  Glucose: Neg Nitrites: Neg Trichomonas: Not Present    Leukocyte Esterase: Neg Mucous: Not Present      Epithelial Cells: NS (Not Seen)      Yeast: NS (Not Seen)      Sperm: Not Present    Notes: LARGE AMOUNT OF AMORPHOUS NOTED    ASSESSMENT:      ICD-10 Details  1 GU:   Ureteral calculus - N20.1    PLAN:            Medications New Meds: Hydrocodone-Acetaminophen 5 mg-325 mg tablet 1-2 tablet PO Q 6 H prn   #20  0 Refill(s)  Tamsulosin Hcl 0.4 mg capsule 1 capsule PO Q HS   #14  0 Refill(s)  Zofran 4 mg tablet 1 tablet PO Q 8 H   #20  0 Refill(s)            Orders X-Rays: KUB  X-Ray Notes: ...           Schedule X-Rays: 2 Weeks - KUB  Return Visit/Planned Activity: 2 Weeks - Extender          Document Letter(s):  Created for Patient: Clinical Summary         Notes:   1. Right mid ureteral calculus: I had a discussion with Mr. Burkitt today regarding his stone situation. 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 Therefore, he would like to manage his stent without regard to his occupational concerns. We therefore reviewed options including expectant management with medical expulsion versus his definitive therapy with either shockwave lithotripsy or ureteroscopy. Ultimately, he does wish to proceed with a trial of  medical expulsion. He was provided prescriptions for tamsulosin, hydrocodone, and Zofran today. He was instructed on proper use and potential side effects of these medications and will continue to strain his urine. He will follow up in 2 weeks with a KUB with a midlevel provider. If he has been unable to pass his stone, he understands his options would include shockwave lithotripsy or ureteroscopic laser lithotripsy and we reviewed those options in detail today.   Cc: Dr. Walker Kehr        Next Appointment:      Next Appointment: 03/15/2018 09:00 AM    Appointment Type: KUB    Location: [facility] [phone]    Provider: KUB KUB    Reason for Visit: 2 wk ck/KUB-gibson      * Signed by Raynelle Bring, M.D. on 03/01/18 at 8:07 PM (EDT)*

## 2018-03-14 ENCOUNTER — Encounter (HOSPITAL_COMMUNITY)
Admission: RE | Disposition: A | Discharge status: Home / Self Care | Payer: Self-pay | Source: Ambulatory Visit | Attending: Urology

## 2018-03-14 ENCOUNTER — Encounter (HOSPITAL_COMMUNITY): Payer: Self-pay | Admitting: *Deleted

## 2018-03-14 ENCOUNTER — Other Ambulatory Visit: Payer: Self-pay

## 2018-03-14 ENCOUNTER — Ambulatory Visit (HOSPITAL_COMMUNITY): Payer: BLUE CROSS/BLUE SHIELD

## 2018-03-14 ENCOUNTER — Ambulatory Visit (HOSPITAL_COMMUNITY)
Admission: RE | Admit: 2018-03-14 | Discharge: 2018-03-14 | Disposition: A | Discharge status: Home / Self Care | Payer: BLUE CROSS/BLUE SHIELD | Source: Ambulatory Visit | Attending: Urology | Admitting: Urology

## 2018-03-14 DIAGNOSIS — Z79899 Other long term (current) drug therapy: Secondary | ICD-10-CM | POA: Diagnosis not present

## 2018-03-14 DIAGNOSIS — N200 Calculus of kidney: Secondary | ICD-10-CM | POA: Diagnosis present

## 2018-03-14 DIAGNOSIS — Z7984 Long term (current) use of oral hypoglycemic drugs: Secondary | ICD-10-CM | POA: Insufficient documentation

## 2018-03-14 DIAGNOSIS — E7800 Pure hypercholesterolemia, unspecified: Secondary | ICD-10-CM | POA: Insufficient documentation

## 2018-03-14 DIAGNOSIS — N201 Calculus of ureter: Secondary | ICD-10-CM

## 2018-03-14 DIAGNOSIS — Z7982 Long term (current) use of aspirin: Secondary | ICD-10-CM | POA: Insufficient documentation

## 2018-03-14 DIAGNOSIS — E119 Type 2 diabetes mellitus without complications: Secondary | ICD-10-CM | POA: Diagnosis not present

## 2018-03-14 DIAGNOSIS — N132 Hydronephrosis with renal and ureteral calculous obstruction: Secondary | ICD-10-CM | POA: Insufficient documentation

## 2018-03-14 HISTORY — DX: Gastro-esophageal reflux disease without esophagitis: K21.9

## 2018-03-14 HISTORY — PX: EXTRACORPOREAL SHOCK WAVE LITHOTRIPSY: SHX1557

## 2018-03-14 HISTORY — DX: Malignant (primary) neoplasm, unspecified: C80.1

## 2018-03-14 HISTORY — DX: Personal history of urinary calculi: Z87.442

## 2018-03-14 HISTORY — DX: Type 2 diabetes mellitus without complications: E11.9

## 2018-03-14 LAB — GLUCOSE, CAPILLARY: GLUCOSE-CAPILLARY: 137 mg/dL — AB (ref 65–99)

## 2018-03-14 SURGERY — LITHOTRIPSY, ESWL
Anesthesia: LOCAL | Laterality: Right

## 2018-03-14 MED ORDER — MORPHINE SULFATE (PF) 4 MG/ML IV SOLN
2.0000 mg | INTRAVENOUS | Status: AC
  Administered 2018-03-14: 2 mg via INTRAVENOUS
  Filled 2018-03-14: qty 1

## 2018-03-14 MED ORDER — DIPHENHYDRAMINE HCL 25 MG PO CAPS
25.0000 mg | ORAL_CAPSULE | ORAL | Status: AC
  Administered 2018-03-14: 25 mg via ORAL
  Filled 2018-03-14: qty 1

## 2018-03-14 MED ORDER — CIPROFLOXACIN HCL 500 MG PO TABS
500.0000 mg | ORAL_TABLET | ORAL | Status: AC
  Administered 2018-03-14: 500 mg via ORAL
  Filled 2018-03-14: qty 1

## 2018-03-14 MED ORDER — DIAZEPAM 5 MG PO TABS
10.0000 mg | ORAL_TABLET | ORAL | Status: AC
  Administered 2018-03-14: 10 mg via ORAL
  Filled 2018-03-14: qty 2

## 2018-03-14 MED ORDER — SODIUM CHLORIDE 0.9 % IV SOLN
INTRAVENOUS | Status: DC
  Administered 2018-03-14 (×2): via INTRAVENOUS

## 2018-03-14 MED ORDER — HYDROCODONE-ACETAMINOPHEN 5-325 MG PO TABS: 1.0000 | ORAL_TABLET | Freq: Four times a day (QID) | ORAL | Status: DC | PRN

## 2018-03-14 MED ORDER — LACTATED RINGERS IV SOLN
INTRAVENOUS | Status: DC
  Administered 2018-03-14: 12:00:00 via INTRAVENOUS

## 2018-03-14 NOTE — Op Note (Signed)
Please see scanned chart for ESWL operative note. 

## 2018-03-14 NOTE — Discharge Instructions (Addendum)
Moderate Conscious Sedation, Adult, Care After °These instructions provide you with information about caring for yourself after your procedure. Your health care provider may also give you more specific instructions. Your treatment has been planned according to current medical practices, but problems sometimes occur. Call your health care provider if you have any problems or questions after your procedure. °What can I expect after the procedure? °After your procedure, it is common: °· To feel sleepy for several hours. °· To feel clumsy and have poor balance for several hours. °· To have poor judgment for several hours. °· To vomit if you eat too soon. ° °Follow these instructions at home: °For at least 24 hours after the procedure: ° °· Do not: °? Participate in activities where you could fall or become injured. °? Drive. °? Use heavy machinery. °? Drink alcohol. °? Take sleeping pills or medicines that cause drowsiness. °? Make important decisions or sign legal documents. °? Take care of children on your own. °· Rest. °Eating and drinking °· Follow the diet recommended by your health care provider. °· If you vomit: °? Drink water, juice, or soup when you can drink without vomiting. °? Make sure you have little or no nausea before eating solid foods. °General instructions °· Have a responsible adult stay with you until you are awake and alert. °· Take over-the-counter and prescription medicines only as told by your health care provider. °· If you smoke, do not smoke without supervision. °· Keep all follow-up visits as told by your health care provider. This is important. °Contact a health care provider if: °· You keep feeling nauseous or you keep vomiting. °· You feel light-headed. °· You develop a rash. °· You have a fever. °Get help right away if: °· You have trouble breathing. °This information is not intended to replace advice given to you by your health care provider. Make sure you discuss any questions you have  with your health care provider. °Document Released: 09/24/2013 Document Revised: 05/08/2016 Document Reviewed: 03/25/2016 °Elsevier Interactive Patient Education © 2018 [employer] °Lithotripsy, Care After °This sheet gives you information about how to care for yourself after your procedure. Your health care provider may also give you more specific instructions. If you have problems or questions, contact your health care provider. °What can I expect after the procedure? °After the procedure, it is common to have: °· Some blood in your urine. This should only last for a few days. °· Soreness in your back, sides, or upper abdomen for a few days. °· Blotches or bruises on your back where the pressure wave entered the skin. °· Pain, discomfort, or nausea when pieces (fragments) of the kidney stone move through the tube that carries urine from the kidney to the bladder (ureter). Stone fragments may pass soon after the procedure, but they may continue to pass for up to 4-8 weeks. °? If you have severe pain or nausea, contact your health care provider. This may be caused by a large stone that was not broken up, and this may mean that you need more treatment. °· Some pain or discomfort during urination. °· Some pain or discomfort in the lower abdomen or (in men) at the base of the penis. ° °Follow these instructions at home: °Medicines °· Take over-the-counter and prescription medicines only as told by your health care provider. °· If you were prescribed an antibiotic medicine, take it as told by your health care provider. Do not stop taking the antibiotic even if you   start to feel better. °· Do not drive for 24 hours if you were given a medicine to help you relax (sedative). °· Do not drive or use heavy machinery while taking prescription pain medicine. °Eating and drinking °· Drink enough water and fluids to keep your urine clear or pale yellow. This helps any remaining pieces of the stone to pass. It can also help  prevent new stones from forming. °· Eat plenty of fresh fruits and vegetables. °· Follow instructions from your health care provider about eating and drinking restrictions. You may be instructed: °? To reduce how much salt (sodium) you eat or drink. Check ingredients and nutrition facts on packaged foods and beverages. °? To reduce how much meat you eat. °· Eat the recommended amount of calcium for your age and gender. Ask your health care provider how much calcium you should have. °General instructions °· Get plenty of rest. °· Most people can resume normal activities 1-2 days after the procedure. Ask your health care provider what activities are safe for you. °· If directed, strain all urine through the strainer that was provided by your health care provider. °? Keep all fragments for your health care provider to see. Any stones that are found may be sent to a medical lab for examination. The stone may be as small as a grain of salt. °· Keep all follow-up visits as told by your health care provider. This is important. °Contact a health care provider if: °· You have pain that is severe or does not get better with medicine. °· You have nausea that is severe or does not go away. °· You have blood in your urine longer than your health care provider told you to expect. °· You have more blood in your urine. °· You have pain during urination that does not go away. °· You urinate more frequently than usual and this does not go away. °· You develop a rash or any other possible signs of an allergic reaction. °Get help right away if: °· You have severe pain in your back, sides, or upper abdomen. °· You have severe pain while urinating. °· Your urine is very dark red. °· You have blood in your stool (feces). °· You cannot pass any urine at all. °· You feel a strong urge to urinate after emptying your bladder. °· You have a fever or chills. °· You develop shortness of breath, difficulty breathing, or chest pain. °· You have  severe nausea that leads to persistent vomiting. °· You faint. °Summary °· After this procedure, it is common to have some pain, discomfort, or nausea when pieces (fragments) of the kidney stone move through the tube that carries urine from the kidney to the bladder (ureter). If this pain or nausea is severe, however, you should contact your health care provider. °· Most people can resume normal activities 1-2 days after the procedure. Ask your health care provider what activities are safe for you. °· Drink enough water and fluids to keep your urine clear or pale yellow. This helps any remaining pieces of the stone to pass, and it can help prevent new stones from forming. °· If directed, strain your urine and keep all fragments for your health care provider to see. Fragments or stones may be as small as a grain of salt. °· Get help right away if you have severe pain in your back, sides, or upper abdomen or have severe pain while urinating. °This information is not intended to replace advice   given to you by your health care provider. Make sure you discuss any questions you have with your health care provider. °Document Released: 12/24/2007 Document Revised: 10/25/2016 Document Reviewed: 10/25/2016 °Elsevier Interactive Patient Education © 2018 Else[employer] Inc. ° ° °1. You should strain your urine and collect all fragments and bring them to your follow up appointment.  °2. You should take your pain medication as needed.  Please call if your pain is severe to the point that it is not controlled with your pain medication. °3. You should call if you develop fever > 101 or persistent nausea or vomiting. °4. Your doctor may prescribe tamsulosin to take to help facilitate stone passage. °

## 2018-03-14 NOTE — Interval H&P Note (Signed)
History and Physical Interval Note:  03/14/2018 11:03 AM  Brian Ayers  has presented today for surgery, with the diagnosis of RIGHT URETERAL CALCULUS  The various methods of treatment have been discussed with the patient and family. After consideration of risks, benefits and other options for treatment, the patient has consented to  Procedure(s): RIGHT EXTRACORPOREAL SHOCK WAVE LITHOTRIPSY (ESWL) (Right) as a surgical intervention .  The patient's history has been reviewed, patient examined, no change in status, stable for surgery.  I have reviewed the patient's chart and labs.  Questions were answered to the patient's satisfaction.     [name],LES

## 2018-03-16 ENCOUNTER — Encounter: Payer: Self-pay | Admitting: Internal Medicine

## 2018-03-18 ENCOUNTER — Encounter (HOSPITAL_COMMUNITY): Payer: Self-pay | Admitting: Urology

## 2018-03-25 ENCOUNTER — Other Ambulatory Visit: Payer: Self-pay | Admitting: Internal Medicine

## 2018-03-25 ENCOUNTER — Other Ambulatory Visit (INDEPENDENT_AMBULATORY_CARE_PROVIDER_SITE_OTHER): Payer: BLUE CROSS/BLUE SHIELD

## 2018-03-25 DIAGNOSIS — K219 Gastro-esophageal reflux disease without esophagitis: Secondary | ICD-10-CM | POA: Diagnosis not present

## 2018-03-25 DIAGNOSIS — R030 Elevated blood-pressure reading, without diagnosis of hypertension: Secondary | ICD-10-CM

## 2018-03-25 DIAGNOSIS — J4520 Mild intermittent asthma, uncomplicated: Secondary | ICD-10-CM | POA: Diagnosis not present

## 2018-03-25 LAB — LIPID PANEL
CHOL/HDL RATIO: 3
CHOLESTEROL: 130 mg/dL (ref 0–200)
HDL: 44.6 mg/dL (ref >39.00)
LDL Cholesterol: 71 mg/dL (ref 0–99)
NonHDL: 85.59
TRIGLYCERIDES: 75 mg/dL (ref 0.0–149.0)
VLDL: 15 mg/dL (ref 0.0–40.0)

## 2018-03-25 LAB — CBC WITH DIFFERENTIAL/PLATELET
BASOS ABS: 0.1 10*3/uL (ref 0.0–0.1)
BASOS PCT: 0.8 % (ref 0.0–3.0)
EOS ABS: 0.2 10*3/uL (ref 0.0–0.7)
Eosinophils Relative: 3.6 % (ref 0.0–5.0)
HCT: 40.5 % (ref 39.0–52.0)
HEMOGLOBIN: 14.2 g/dL (ref 13.0–17.0)
Lymphocytes Relative: 44.5 % (ref 12.0–46.0)
Lymphs Abs: 2.8 10*3/uL (ref 0.7–4.0)
MCHC: 35.2 g/dL (ref 30.0–36.0)
MCV: 89.1 fl (ref 78.0–100.0)
MONO ABS: 0.7 10*3/uL (ref 0.1–1.0)
Monocytes Relative: 10.6 % (ref 3.0–12.0)
Neutro Abs: 2.6 10*3/uL (ref 1.4–7.7)
Neutrophils Relative %: 40.5 % — ABNORMAL LOW (ref 43.0–77.0)
Platelets: 251 10*3/uL (ref 150.0–400.0)
RBC: 4.55 Mil/uL (ref 4.22–5.81)
RDW: 12.3 % (ref 11.5–15.5)
WBC: 6.4 10*3/uL (ref 4.0–10.5)

## 2018-03-25 LAB — URINALYSIS
BILIRUBIN URINE: NEGATIVE
HGB URINE DIPSTICK: NEGATIVE
Ketones, ur: NEGATIVE
Leukocytes, UA: NEGATIVE
NITRITE: NEGATIVE
Specific Gravity, Urine: <=1.005 — AB (ref 1.000–1.030)
Total Protein, Urine: NEGATIVE
UROBILINOGEN UA: 0.2 (ref 0.0–1.0)
Urine Glucose: NEGATIVE
pH: 5.5 (ref 5.0–8.0)

## 2018-03-25 LAB — HEPATIC FUNCTION PANEL
ALBUMIN: 4.4 g/dL (ref 3.5–5.2)
ALK PHOS: 57 U/L (ref 39–117)
ALT: 19 U/L (ref 0–53)
AST: 17 U/L (ref 0–37)
Bilirubin, Direct: 0.1 mg/dL (ref 0.0–0.3)
TOTAL PROTEIN: 6.6 g/dL (ref 6.0–8.3)
Total Bilirubin: 0.7 mg/dL (ref 0.2–1.2)

## 2018-03-25 LAB — BASIC METABOLIC PANEL
BUN: 17 mg/dL (ref 6–23)
CALCIUM: 9.6 mg/dL (ref 8.4–10.5)
CHLORIDE: 104 meq/L (ref 96–112)
CO2: 26 mEq/L (ref 19–32)
CREATININE: 0.99 mg/dL (ref 0.40–1.50)
GFR: 80.68 mL/min (ref >60.00)
Glucose, Bld: 116 mg/dL — ABNORMAL HIGH (ref 70–99)
Potassium: 4.8 mEq/L (ref 3.5–5.1)
Sodium: 139 mEq/L (ref 135–145)

## 2018-03-25 LAB — TSH: TSH: 4.22 u[IU]/mL (ref 0.35–4.50)

## 2018-03-25 LAB — MICROALBUMIN / CREATININE URINE RATIO
Creatinine,U: 12.5 mg/dL
MICROALB/CREAT RATIO: 5.6 mg/g (ref 0.0–30.0)
Microalb, Ur: <0.7 mg/dL (ref 0.0–1.9)

## 2018-03-25 LAB — PSA: PSA: 0.9 ng/mL (ref 0.10–4.00)

## 2018-03-25 LAB — HEMOGLOBIN A1C: Hgb A1c MFr Bld: 6.8 % — ABNORMAL HIGH (ref 4.6–6.5)

## 2018-04-05 ENCOUNTER — Encounter (HOSPITAL_COMMUNITY): Payer: Self-pay | Admitting: Emergency Medicine

## 2018-04-05 ENCOUNTER — Emergency Department (HOSPITAL_COMMUNITY)
Admission: EM | Admit: 2018-04-05 | Discharge: 2018-04-05 | Disposition: A | Discharge status: Home / Self Care | Payer: BLUE CROSS/BLUE SHIELD | Attending: Emergency Medicine | Admitting: Emergency Medicine

## 2018-04-05 ENCOUNTER — Emergency Department (HOSPITAL_COMMUNITY): Payer: BLUE CROSS/BLUE SHIELD

## 2018-04-05 DIAGNOSIS — R339 Retention of urine, unspecified: Secondary | ICD-10-CM

## 2018-04-05 DIAGNOSIS — N23 Unspecified renal colic: Secondary | ICD-10-CM | POA: Diagnosis not present

## 2018-04-05 DIAGNOSIS — N3289 Other specified disorders of bladder: Secondary | ICD-10-CM

## 2018-04-05 DIAGNOSIS — Z7982 Long term (current) use of aspirin: Secondary | ICD-10-CM | POA: Insufficient documentation

## 2018-04-05 DIAGNOSIS — R338 Other retention of urine: Secondary | ICD-10-CM | POA: Diagnosis present

## 2018-04-05 DIAGNOSIS — Z87891 Personal history of nicotine dependence: Secondary | ICD-10-CM | POA: Insufficient documentation

## 2018-04-05 DIAGNOSIS — Z9889 Other specified postprocedural states: Secondary | ICD-10-CM | POA: Insufficient documentation

## 2018-04-05 DIAGNOSIS — Z79899 Other long term (current) drug therapy: Secondary | ICD-10-CM | POA: Diagnosis not present

## 2018-04-05 DIAGNOSIS — Z87442 Personal history of urinary calculi: Secondary | ICD-10-CM | POA: Diagnosis not present

## 2018-04-05 LAB — BASIC METABOLIC PANEL
Anion gap: 9 (ref 5–15)
BUN: 22 mg/dL — AB (ref 6–20)
CALCIUM: 9.4 mg/dL (ref 8.9–10.3)
CHLORIDE: 103 mmol/L (ref 101–111)
CO2: 24 mmol/L (ref 22–32)
CREATININE: 1.02 mg/dL (ref 0.61–1.24)
GFR calc non Af Amer: >60 mL/min (ref >60)
GLUCOSE: 163 mg/dL — AB (ref 65–99)
Potassium: 4.5 mmol/L (ref 3.5–5.1)
Sodium: 136 mmol/L (ref 135–145)

## 2018-04-05 LAB — URINALYSIS, ROUTINE W REFLEX MICROSCOPIC
Bilirubin Urine: NEGATIVE
GLUCOSE, UA: NEGATIVE mg/dL
KETONES UR: NEGATIVE mg/dL
LEUKOCYTES UA: NEGATIVE
Nitrite: NEGATIVE
PH: 5 (ref 5.0–8.0)
Protein, ur: NEGATIVE mg/dL
SPECIFIC GRAVITY, URINE: 1.025 (ref 1.005–1.030)
Squamous Epithelial / LPF: NONE SEEN

## 2018-04-05 LAB — CBC WITH DIFFERENTIAL/PLATELET
BASOS PCT: 0 %
Basophils Absolute: 0 10*3/uL (ref 0.0–0.1)
Eosinophils Absolute: 0.1 10*3/uL (ref 0.0–0.7)
Eosinophils Relative: 1 %
HEMATOCRIT: 40.1 % (ref 39.0–52.0)
HEMOGLOBIN: 13.7 g/dL (ref 13.0–17.0)
LYMPHS ABS: 1.5 10*3/uL (ref 0.7–4.0)
Lymphocytes Relative: 17 %
MCH: 30.9 pg (ref 26.0–34.0)
MCHC: 34.2 g/dL (ref 30.0–36.0)
MCV: 90.3 fL (ref 78.0–100.0)
MONO ABS: 0.7 10*3/uL (ref 0.1–1.0)
MONOS PCT: 8 %
NEUTROS ABS: 6.6 10*3/uL (ref 1.7–7.7)
Neutrophils Relative %: 74 %
Platelets: 185 10*3/uL (ref 150–400)
RBC: 4.44 MIL/uL (ref 4.22–5.81)
RDW: 11.8 % (ref 11.5–15.5)
WBC: 8.9 10*3/uL (ref 4.0–10.5)

## 2018-04-05 MED ORDER — DOCUSATE SODIUM 250 MG PO CAPS: 250.0000 mg | ORAL_CAPSULE | Freq: Every day | ORAL | 0 refills | Status: DC

## 2018-04-05 MED ORDER — TAMSULOSIN HCL 0.4 MG PO CAPS: 0.4000 mg | ORAL_CAPSULE | Freq: Two times a day (BID) | ORAL | 0 refills | Status: AC

## 2018-04-05 MED ORDER — TAMSULOSIN HCL 0.4 MG PO CAPS: 0.4000 mg | ORAL_CAPSULE | Freq: Every day | ORAL | 0 refills | Status: AC

## 2018-04-05 MED ORDER — TAMSULOSIN HCL 0.4 MG PO CAPS: 0.4000 mg | ORAL_CAPSULE | Freq: Once | ORAL | Status: DC

## 2018-04-05 NOTE — ED Provider Notes (Signed)
[facility] DEPT Provider Note   CSN: [id number] Arrival date & time: 04/05/18  1030     History   Chief Complaint Chief Complaint  Patient presents with  . Urinary Retention    HPI STELIOS KIRBY is a 67 y.o. male.  HPI 67 year old male with past medical history as below including history of kidney stones status post recent lithotripsy with Dr. Alinda Money here with urinary retention.  The patient states that for the last several hours, he had acute onset of right lower quadrant and flank pain.  This felt similar to his previous stones.  Been doing well since his lithotripsy.  He states he has had associated urinary frequency and a sensation that he is not peeing as much as he should.  Said associated mild lower abdominal fullness.  No nausea or vomiting.  No fevers or chills.  Denies any blood in his urine or dysuria.  He tried to pee and empty his bladder throughout the night without significant success.  He subsequent presents for further evaluation.  He called urology prior to  Past Medical History:  Diagnosis Date  . Alcohol abuse    hixtory of  . Allergy   . Asthma childhood  . Cancer ([place])    skin cancer on face X1  . Diabetes mellitus without complication ([place])   . GERD (gastroesophageal reflux disease)   . History of kidney stones   . Hx of colonic polyps   . Hyperlipidemia   . Rhinitis    on allery shots    Patient Active Problem List   Diagnosis Date Noted  . Erectile dysfunction 11/06/2016  . Tinea corporis 11/06/2016  . Asthma, chronic 03/13/2016  . Trigger finger, acquired 01/12/2015  . Elevated BP 09/09/2014  . Metatarsalgia 08/10/2014  . Diabetes type 2, controlled ([place]) 06/23/2014  . Well adult exam 05/25/2014  . GERD (gastroesophageal reflux disease) 05/25/2014  . Elevated blood pressure reading without diagnosis of hypertension 11/01/2012  . Routine health maintenance 09/14/2011  . COLONIC POLYPS, HX OF 05/17/2008  .  PLANTAR FASCIITIS, BILATERAL 05/15/2008  . ILIOTIBIAL BAND SYNDROME, LEFT KNEE 05/15/2008  . TALIPES CAVUS 05/15/2008  . ALCOHOL ABUSE, IN REMISSION, HX OF 04/28/2008  . Dyslipidemia 09/16/2007  . RHINITIS 09/16/2007    Past Surgical History:  Procedure Laterality Date  . EXTRACORPOREAL SHOCK WAVE LITHOTRIPSY Right 03/14/2018   Procedure: RIGHT EXTRACORPOREAL SHOCK WAVE LITHOTRIPSY (ESWL);  Surgeon: Raynelle Bring, MD;  Location: [facility];  Service: Urology;  Laterality: Right;  . hernia with hydrocele  1973  . left shoulder pin setting  1978  . TONSILLECTOMY  1967  . Strawberry EXTRACTION  2004        Home Medications    Prior to Admission medications   Medication Sig Start Date End Date Taking? Authorizing Provider  ADVAIR DISKUS 100-50 MCG/DOSE AEPB INHALE 1 PUFF INTO THE LUNGS TWO TIMES DAILY Patient taking differently: INHALE 1 PUFF INTO THE LUNGS TWO TIMES DAILY as needed 06/25/17  Yes Plotnikov, Evie Lacks, MD  aspirin 81 MG chewable tablet Chew 81 mg by mouth daily.   Yes [provider]  Azelastine-Fluticasone (DYMISTA) 137-50 MCG/ACT SUSP 1 spr each nostril bid Patient taking differently: 1 spr each nostril bid as needed 11/01/15  Yes Plotnikov, Evie Lacks, MD  Biotin 2500 MCG CAPS Take 2,500 mcg by mouth daily.    Yes [provider]  EPINEPHrine 0.3 mg/0.3 mL IJ SOAJ injection as needed. 04/03/16  Yes [provider]  HYDROcodone-acetaminophen (NORCO/VICODIN) 5-325 MG tablet Take 1-2 tablets by mouth every 8 (eight) hours as needed for moderate pain.   Yes [provider]  metFORMIN (GLUCOPHAGE) 500 MG tablet Take 1000 mg in am and 500 mg at night 01/15/18  Yes Plotnikov, Evie Lacks, MD  ondansetron (ZOFRAN) 4 MG tablet Take 4 mg by mouth as needed for nausea or vomiting.   Yes [provider]  PROAIR HFA 108 (90 BASE) MCG/ACT inhaler as needed. 02/26/15  Yes [provider]  ranitidine (ZANTAC) 150 MG tablet Take 1 tablet  (150 mg total) by mouth 2 (two) times daily. Patient taking differently: Take 150 mg by mouth daily.  01/01/18  Yes Plotnikov, Evie Lacks, MD  rosuvastatin (CRESTOR) 10 MG tablet Take 1 tablet (10 mg total) by mouth daily. 01/15/18  Yes Plotnikov, Evie Lacks, MD  UNABLE TO FIND Med Name: Takes allergy shots weekly   Yes [provider]  docusate sodium (COLACE) 250 MG capsule Take 1 capsule (250 mg total) by mouth daily. 04/05/18   Duffy Bruce, MD  glucose blood (ONE TOUCH ULTRA TEST) test strip Use to check blood sugars twice a day 04/18/17   Plotnikov, Evie Lacks, MD  loratadine (CLARITIN) 10 MG tablet TAKE 1 TABLET (10 MG TOTAL) BY MOUTH DAILY. Patient not taking: Reported on 04/05/2018 03/11/18   Plotnikov, Evie Lacks, MD  metFORMIN (GLUCOPHAGE) 500 MG tablet TAKE 1 TABLET (500 MG TOTAL) BY MOUTH 2 (TWO) TIMES DAILY WITH A MEAL. Patient not taking: Reported on 04/05/2018 03/25/18   Plotnikov, Evie Lacks, MD  [facility] DELICA LANCETS FINE MISC USE TO CHECK BS TWICE A DAY 12/12/17   Plotnikov, Evie Lacks, MD  [facility] VERIO test strip USE TO CHECK BLOOD SUGAR TWICE A DAY DX E11.9 03/11/18   Plotnikov, Evie Lacks, MD  sildenafil (VIAGRA) 100 MG tablet Take 1 tablet (100 mg total) by mouth as needed for erectile dysfunction. 11/06/16 11/06/17  Plotnikov, Evie Lacks, MD  tamsulosin (FLOMAX) 0.4 MG CAPS capsule Take 1 capsule (0.4 mg total) by mouth daily for 10 days. 04/05/18 04/15/18  Duffy Bruce, MD  tamsulosin (FLOMAX) 0.4 MG CAPS capsule Take 1 capsule (0.4 mg total) by mouth 2 (two) times daily for 7 days. 04/05/18 04/12/18  Duffy Bruce, MD    Family History Family History  Problem Relation Age of Onset  . Cancer Mother        breast (hx of)  . Other Mother        trigeminal neuralgia s/p gamma knife/ CHF  . Hypertension Father   . Diabetes Neg Hx   . Colon cancer Neg Hx     Social History Social History   Tobacco Use  . Smoking status: Former Smoker    Last attempt to quit:  12/18/1985    Years since quitting: 32.3  . Smokeless tobacco: Former Systems developer    Types: Snuff, Chew    Quit date: 12/18/1985  . Tobacco comment: only for a short period in the 1980's  Substance Use Topics  . Alcohol use: No    Comment: history of ETOH abuse  . Drug use: No     Allergies   Pseudoephedrine   Review of Systems Review of Systems  Genitourinary: Positive for decreased urine volume and frequency.  All other systems reviewed and are negative.    Physical Exam Updated Vital Signs BP (!) 158/80   Pulse 60   Temp 97.8 F (36.6 C) (Oral)   Resp 14  SpO2 100%   Physical Exam  Constitutional: He is oriented to person, place, and time. He appears well-developed and well-nourished. No distress.  HENT:  Head: Normocephalic and atraumatic.  Eyes: Conjunctivae are normal.  Neck: Neck supple.  Cardiovascular: Normal rate, regular rhythm and normal heart sounds. Exam reveals no friction rub.  No murmur heard. Pulmonary/Chest: Effort normal and breath sounds normal. No respiratory distress. He has no wheezes. He has no rales.  Abdominal: Soft. Bowel sounds are normal. He exhibits no distension. There is no tenderness. There is no guarding.  No abdominal fullness.  No CVA tenderness.  Musculoskeletal: He exhibits no edema.  Neurological: He is alert and oriented to person, place, and time. He exhibits normal muscle tone.  Skin: Skin is warm. Capillary refill takes less than 2 seconds.  Psychiatric: He has a normal mood and affect.  Nursing note and vitals reviewed.    ED Treatments / Results  Labs (all labs ordered are listed, but only abnormal results are displayed) Labs Reviewed  URINALYSIS, ROUTINE W REFLEX MICROSCOPIC - Abnormal; Notable for the following components:      Result Value   APPearance HAZY (*)    Hgb urine dipstick MODERATE (*)    Bacteria, UA RARE (*)    All other components within normal limits  BASIC METABOLIC PANEL - Abnormal; Notable for the  following components:   Glucose, Bld 163 (*)    BUN 22 (*)    All other components within normal limits  CBC WITH DIFFERENTIAL/PLATELET    EKG None  Radiology Dg Abdomen 1 View  Result Date: 04/05/2018 CLINICAL DATA:  Patient status post lithotripsy for a right ureteral stone 2 weeks ago. Continued abdominal pain. EXAM: ABDOMEN - 1 VIEW COMPARISON:  KUB 03/14/2018 and 03/01/2018. CT abdomen and pelvis 02/28/2018. FINDINGS: A new calcification in the right pelvis measuring 0.6 cm in diameter is at the expected location of the right ureterovesical junction. Four phleboliths in the right pelvis are unchanged. No evidence of urinary tract stone. The bowel gas pattern is normal. IMPRESSION: 0.6 cm calcification in the right pelvis is new since the prior exams and compatible with a stone which could be either at the right UVJ or in the urinary bladder. UVJ is favored. Electronically Signed   By: Inge Rise M.D.   On: 04/05/2018 13:01    Procedures Procedures (including critical care time)  Medications Ordered in ED Medications  tamsulosin (FLOMAX) capsule 0.4 mg (has no administration in time range)     Initial Impression / Assessment and Plan / ED Course  I have reviewed the triage vital signs and the nursing notes.  Pertinent labs & imaging results that were available during my care of the patient were reviewed by me and considered in my medical decision making (see chart for details).  Clinical Course as of Apr 06 1523  Fri Apr 19, 10115  4824 67 year old male here with urinary frequency in the setting of recent lithotripsy.  On arrival, the patient is in no obvious distress.  He has no significant urinary retention.  Suspect he is having urinary frequency secondary to UTI versus spasm.  May have recurrence of stones as well.  Will discuss with urology.   [CI]  1208 CBC unremarkable.  BMP with mild dehydration.  Urinalysis shows 6-30 red blood cells, but no white cells or  bacteria.  Will discuss with urology.   [CI]    Clinical Course User Index [CI] Duffy Bruce, MD  KUB shows new stone that appears to be at the UVJ or bladder.  Pain is resolved, consistent with likely bladder stent.  Suspect he is having urinary frequency secondary to the passing stone.  No evidence of AKI or infection.  Discussed with Dr. Alyson Ingles of urology, will double Flomax per urology, discharged with outpatient follow-up.  Return precautions given.  Encourage fluids.  Final Clinical Impressions(s) / ED Diagnoses   Final diagnoses:  Urinary retention  Bladder spasm  Renal colic    ED Discharge Orders        Ordered    docusate sodium (COLACE) 250 MG capsule  Daily     04/05/18 1248    tamsulosin (FLOMAX) 0.4 MG CAPS capsule  Daily     04/05/18 1248    tamsulosin (FLOMAX) 0.4 MG CAPS capsule  2 times daily     04/05/18 1425       Duffy Bruce, MD 04/05/18 1524

## 2018-04-05 NOTE — ED Notes (Signed)
Bed: WA01 Expected date:  Expected time:  Means of arrival:  Comments: 

## 2018-04-05 NOTE — ED Triage Notes (Signed)
Pt reports this morning having urinary retention even after taking FLomax. Pt had lithotripsy couple days ago. [facility] and was told to come to ED.

## 2018-04-05 NOTE — Discharge Instructions (Addendum)
Follow-up this week as scheduled  Per Dr. Alyson Ingles, start taking the flomax TWICE a day. I've given you a dose here. Take the second dose tonight before bedtime, then start taking twice a day, once in the morning and once at night.  Drink at least 8 glasses of water/day

## 2018-04-16 ENCOUNTER — Ambulatory Visit (INDEPENDENT_AMBULATORY_CARE_PROVIDER_SITE_OTHER): Payer: BLUE CROSS/BLUE SHIELD | Admitting: Internal Medicine

## 2018-04-16 ENCOUNTER — Encounter: Payer: Self-pay | Admitting: Internal Medicine

## 2018-04-16 DIAGNOSIS — E119 Type 2 diabetes mellitus without complications: Secondary | ICD-10-CM | POA: Diagnosis not present

## 2018-04-16 DIAGNOSIS — Z0000 Encounter for general adult medical examination without abnormal findings: Secondary | ICD-10-CM | POA: Diagnosis not present

## 2018-04-16 DIAGNOSIS — N200 Calculus of kidney: Secondary | ICD-10-CM

## 2018-04-16 MED ORDER — SILDENAFIL CITRATE 100 MG PO TABS: 100.0000 mg | ORAL_TABLET | ORAL | 11 refills | Status: DC | PRN

## 2018-04-16 NOTE — Assessment & Plan Note (Signed)
We discussed age appropriate health related issues, including available/recomended screening tests and vaccinations. We discussed a need for adhering to healthy diet and exercise. Labs were ordered to be later reviewed . All questions were answered.   

## 2018-04-16 NOTE — Assessment & Plan Note (Signed)
Metformin 

## 2018-04-16 NOTE — Progress Notes (Signed)
Subjective:  Patient ID: Brian Ayers, male    DOB: 1951-02-09  Age: 67 y.o. MRN: [id number]  CC: No chief complaint on file.   HPI MYKEAL CARRICK presents for a well exam C/o renal colic on March 37SE 8315 - passed a stone 1 mo later (R side) - s/p lithotripsy by Dr Alinda Money  Outpatient Medications Prior to Visit  Medication Sig Dispense Refill  . ADVAIR DISKUS 100-50 MCG/DOSE AEPB INHALE 1 PUFF INTO THE LUNGS TWO TIMES DAILY (Patient taking differently: INHALE 1 PUFF INTO THE LUNGS TWO TIMES DAILY as needed) 180 each 1  . aspirin 81 MG chewable tablet Chew 81 mg by mouth daily.    . Azelastine-Fluticasone (DYMISTA) 137-50 MCG/ACT SUSP 1 spr each nostril bid (Patient taking differently: 1 spr each nostril bid as needed) 3 Bottle 3  . Biotin 2500 MCG CAPS Take 2,500 mcg by mouth daily.     Marland Kitchen docusate sodium (COLACE) 250 MG capsule Take 1 capsule (250 mg total) by mouth daily. 10 capsule 0  . EPINEPHrine 0.3 mg/0.3 mL IJ SOAJ injection as needed.  0  . glucose blood (ONE TOUCH ULTRA TEST) test strip Use to check blood sugars twice a day 100 each 5  . HYDROcodone-acetaminophen (NORCO/VICODIN) 5-325 MG tablet Take 1-2 tablets by mouth every 8 (eight) hours as needed for moderate pain.    Marland Kitchen loratadine (CLARITIN) 10 MG tablet TAKE 1 TABLET (10 MG TOTAL) BY MOUTH DAILY. 100 tablet 3  . metFORMIN (GLUCOPHAGE) 500 MG tablet TAKE 1 TABLET (500 MG TOTAL) BY MOUTH 2 (TWO) TIMES DAILY WITH A MEAL. 180 tablet 3  . ondansetron (ZOFRAN) 4 MG tablet Take 4 mg by mouth as needed for nausea or vomiting.    Glory Rosebush DELICA LANCETS FINE MISC USE TO CHECK BS TWICE A DAY 100 each 5  . ONETOUCH VERIO test strip USE TO CHECK BLOOD SUGAR TWICE A DAY DX E11.9 100 each 3  . PROAIR HFA 108 (90 BASE) MCG/ACT inhaler as needed.  0  . ranitidine (ZANTAC) 150 MG tablet Take 1 tablet (150 mg total) by mouth 2 (two) times daily. (Patient taking differently: Take 150 mg by mouth daily. ) 180 tablet 3  . rosuvastatin  (CRESTOR) 10 MG tablet Take 1 tablet (10 mg total) by mouth daily. 90 tablet 3  . UNABLE TO FIND Med Name: Takes allergy shots weekly    . sildenafil (VIAGRA) 100 MG tablet Take 1 tablet (100 mg total) by mouth as needed for erectile dysfunction. 12 tablet 11  . metFORMIN (GLUCOPHAGE) 500 MG tablet Take 1000 mg in am and 500 mg at night (Patient not taking: Reported on 04/16/2018) 270 tablet 3   No facility-administered medications prior to visit.     ROS Review of Systems  Constitutional: Negative for appetite change, fatigue and unexpected weight change.  HENT: Negative for congestion, nosebleeds, sneezing, sore throat and trouble swallowing.   Eyes: Negative for itching and visual disturbance.  Respiratory: Negative for cough.   Cardiovascular: Negative for chest pain, palpitations and leg swelling.  Gastrointestinal: Negative for abdominal distention, blood in stool, diarrhea and nausea.  Genitourinary: Negative for frequency and hematuria.  Musculoskeletal: Positive for back pain. Negative for gait problem, joint swelling and neck pain.  Skin: Negative for rash.  Neurological: Negative for dizziness, tremors, speech difficulty and weakness.  Psychiatric/Behavioral: Negative for agitation, dysphoric mood and sleep disturbance. The patient is not nervous/anxious.     Objective:  BP 136/82 (BP  Location: Left Arm, Patient Position: Sitting, Cuff Size: Normal)   Pulse 61   Temp 98.1 F (36.7 C) (Oral)   Ht 5\' 10"  (1.778 m)   Wt 203 lb (92.1 kg)   SpO2 99%   BMI 29.13 kg/m   BP Readings from Last 3 Encounters:  04/16/18 136/82  04/05/18 (!) 158/80  03/14/18 (!) 172/96    Wt Readings from Last 3 Encounters:  04/16/18 203 lb (92.1 kg)  03/14/18 204 lb 3.2 oz (92.6 kg)  01/15/18 211 lb (95.7 kg)    Physical Exam  Constitutional: He is oriented to person, place, and time. He appears well-developed and well-nourished. No distress.  HENT:  Head: Normocephalic and atraumatic.    Right Ear: External ear normal.  Left Ear: External ear normal.  Nose: Nose normal.  Mouth/Throat: Oropharynx is clear and moist. No oropharyngeal exudate.  Eyes: Pupils are equal, round, and reactive to light. Conjunctivae and EOM are normal. Right eye exhibits no discharge. Left eye exhibits no discharge. No scleral icterus.  Neck: Normal range of motion. Neck supple. No JVD present. No tracheal deviation present. No thyromegaly present.  Cardiovascular: Normal rate, regular rhythm, normal heart sounds and intact distal pulses. Exam reveals no gallop and no friction rub.  No murmur heard. Pulmonary/Chest: Effort normal and breath sounds normal. No stridor. No respiratory distress. He has no wheezes. He has no rales. He exhibits no tenderness.  Abdominal: Soft. Bowel sounds are normal. He exhibits no distension and no mass. There is no tenderness. There is no rebound and no guarding.  Musculoskeletal: Normal range of motion. He exhibits no edema or tenderness.  Lymphadenopathy:    He has no cervical adenopathy.  Neurological: He is alert and oriented to person, place, and time. He has normal reflexes. He displays normal reflexes. No cranial nerve deficit. He exhibits normal muscle tone. Coordination normal.  Skin: Skin is warm and dry. No rash noted. He is not diaphoretic. No erythema. No pallor.  Psychiatric: He has a normal mood and affect. His behavior is normal. Judgment and thought content normal.   Rectal - per Urology  Lab Results  Component Value Date   WBC 8.9 04/05/2018   HGB 13.7 04/05/2018   HCT 40.1 04/05/2018   PLT 185 04/05/2018   GLUCOSE 163 (H) 04/05/2018   CHOL 130 03/25/2018   TRIG 75.0 03/25/2018   HDL 44.60 03/25/2018   LDLCALC 71 03/25/2018   ALT 19 03/25/2018   AST 17 03/25/2018   NA 136 04/05/2018   K 4.5 04/05/2018   CL 103 04/05/2018   CREATININE 1.02 04/05/2018   BUN 22 (H) 04/05/2018   CO2 24 04/05/2018   TSH 4.22 03/25/2018   PSA 0.90 03/25/2018    HGBA1C 6.8 (H) 03/25/2018   MICROALBUR <0.7 03/25/2018    Dg Abdomen 1 View  Result Date: 04/05/2018 CLINICAL DATA:  Patient status post lithotripsy for a right ureteral stone 2 weeks ago. Continued abdominal pain. EXAM: ABDOMEN - 1 VIEW COMPARISON:  KUB 03/14/2018 and 03/01/2018. CT abdomen and pelvis 02/28/2018. FINDINGS: A new calcification in the right pelvis measuring 0.6 cm in diameter is at the expected location of the right ureterovesical junction. Four phleboliths in the right pelvis are unchanged. No evidence of urinary tract stone. The bowel gas pattern is normal. IMPRESSION: 0.6 cm calcification in the right pelvis is new since the prior exams and compatible with a stone which could be either at the right UVJ or in the urinary bladder.  UVJ is favored. Electronically Signed   By: Inge Rise M.D.   On: 04/05/2018 13:01    Assessment & Plan:   There are no diagnoses linked to this encounter. I am having [name] maintain his Biotin, UNABLE TO FIND, PROAIR HFA, Azelastine-Fluticasone, EPINEPHrine, sildenafil, glucose blood, ADVAIR DISKUS, ONETOUCH DELICA LANCETS FINE, ranitidine, rosuvastatin, HYDROcodone-acetaminophen, ondansetron, ONETOUCH VERIO, loratadine, metFORMIN, aspirin, and docusate sodium.  No orders of the defined types were placed in this encounter.    Follow-up: No follow-ups on file.  Walker Kehr, MD

## 2018-04-16 NOTE — Assessment & Plan Note (Signed)
renal colic on March 07KU 5750 - passed a stone 1 mo later (R side) - s/p lithotripsy by Dr Alinda Money

## 2018-04-30 LAB — HM DIABETES EYE EXAM

## 2018-05-20 DIAGNOSIS — H25812 Combined forms of age-related cataract, left eye: Secondary | ICD-10-CM | POA: Diagnosis not present

## 2018-05-20 DIAGNOSIS — H2512 Age-related nuclear cataract, left eye: Secondary | ICD-10-CM | POA: Diagnosis not present

## 2018-05-21 DIAGNOSIS — J3089 Other allergic rhinitis: Secondary | ICD-10-CM | POA: Diagnosis not present

## 2018-05-21 DIAGNOSIS — J301 Allergic rhinitis due to pollen: Secondary | ICD-10-CM | POA: Diagnosis not present

## 2018-05-21 DIAGNOSIS — J3081 Allergic rhinitis due to animal (cat) (dog) hair and dander: Secondary | ICD-10-CM | POA: Diagnosis not present

## 2018-05-27 DIAGNOSIS — J3081 Allergic rhinitis due to animal (cat) (dog) hair and dander: Secondary | ICD-10-CM | POA: Diagnosis not present

## 2018-05-27 DIAGNOSIS — J3089 Other allergic rhinitis: Secondary | ICD-10-CM | POA: Diagnosis not present

## 2018-05-27 DIAGNOSIS — J301 Allergic rhinitis due to pollen: Secondary | ICD-10-CM | POA: Diagnosis not present

## 2018-06-03 DIAGNOSIS — J3089 Other allergic rhinitis: Secondary | ICD-10-CM | POA: Diagnosis not present

## 2018-06-03 DIAGNOSIS — J301 Allergic rhinitis due to pollen: Secondary | ICD-10-CM | POA: Diagnosis not present

## 2018-06-03 DIAGNOSIS — J3081 Allergic rhinitis due to animal (cat) (dog) hair and dander: Secondary | ICD-10-CM | POA: Diagnosis not present

## 2018-06-10 DIAGNOSIS — J301 Allergic rhinitis due to pollen: Secondary | ICD-10-CM | POA: Diagnosis not present

## 2018-06-10 DIAGNOSIS — J3081 Allergic rhinitis due to animal (cat) (dog) hair and dander: Secondary | ICD-10-CM | POA: Diagnosis not present

## 2018-06-10 DIAGNOSIS — J3089 Other allergic rhinitis: Secondary | ICD-10-CM | POA: Diagnosis not present

## 2018-06-13 ENCOUNTER — Encounter: Payer: Self-pay | Admitting: Internal Medicine

## 2018-06-18 DIAGNOSIS — J3089 Other allergic rhinitis: Secondary | ICD-10-CM | POA: Diagnosis not present

## 2018-06-18 DIAGNOSIS — J301 Allergic rhinitis due to pollen: Secondary | ICD-10-CM | POA: Diagnosis not present

## 2018-06-18 DIAGNOSIS — J3081 Allergic rhinitis due to animal (cat) (dog) hair and dander: Secondary | ICD-10-CM | POA: Diagnosis not present

## 2018-06-24 DIAGNOSIS — J3089 Other allergic rhinitis: Secondary | ICD-10-CM | POA: Diagnosis not present

## 2018-06-24 DIAGNOSIS — J3081 Allergic rhinitis due to animal (cat) (dog) hair and dander: Secondary | ICD-10-CM | POA: Diagnosis not present

## 2018-06-24 DIAGNOSIS — J301 Allergic rhinitis due to pollen: Secondary | ICD-10-CM | POA: Diagnosis not present

## 2018-06-26 DIAGNOSIS — H5052 Exophoria: Secondary | ICD-10-CM | POA: Diagnosis not present

## 2018-07-01 ENCOUNTER — Encounter: Payer: Self-pay | Admitting: Family Medicine

## 2018-07-01 ENCOUNTER — Other Ambulatory Visit (INDEPENDENT_AMBULATORY_CARE_PROVIDER_SITE_OTHER): Payer: Medicare Other

## 2018-07-01 ENCOUNTER — Ambulatory Visit (INDEPENDENT_AMBULATORY_CARE_PROVIDER_SITE_OTHER): Payer: Medicare Other | Admitting: Family Medicine

## 2018-07-01 VITALS — BP 130/78 | Ht 70.0 in | Wt 208.0 lb

## 2018-07-01 DIAGNOSIS — M2550 Pain in unspecified joint: Secondary | ICD-10-CM | POA: Diagnosis not present

## 2018-07-01 DIAGNOSIS — M545 Low back pain: Secondary | ICD-10-CM | POA: Diagnosis not present

## 2018-07-01 DIAGNOSIS — M5416 Radiculopathy, lumbar region: Secondary | ICD-10-CM

## 2018-07-01 DIAGNOSIS — E559 Vitamin D deficiency, unspecified: Secondary | ICD-10-CM

## 2018-07-01 DIAGNOSIS — J3089 Other allergic rhinitis: Secondary | ICD-10-CM | POA: Diagnosis not present

## 2018-07-01 DIAGNOSIS — J3081 Allergic rhinitis due to animal (cat) (dog) hair and dander: Secondary | ICD-10-CM | POA: Diagnosis not present

## 2018-07-01 DIAGNOSIS — J301 Allergic rhinitis due to pollen: Secondary | ICD-10-CM | POA: Diagnosis not present

## 2018-07-01 LAB — CBC WITH DIFFERENTIAL/PLATELET
BASOS ABS: 0.1 10*3/uL (ref 0.0–0.1)
BASOS PCT: 0.9 % (ref 0.0–3.0)
EOS PCT: 8.2 % — AB (ref 0.0–5.0)
Eosinophils Absolute: 0.5 10*3/uL (ref 0.0–0.7)
HCT: 42.1 % (ref 39.0–52.0)
Hemoglobin: 14.8 g/dL (ref 13.0–17.0)
LYMPHS PCT: 38.7 % (ref 12.0–46.0)
Lymphs Abs: 2.5 10*3/uL (ref 0.7–4.0)
MCHC: 35.2 g/dL (ref 30.0–36.0)
MCV: 91.3 fl (ref 78.0–100.0)
MONOS PCT: 9.2 % (ref 3.0–12.0)
Monocytes Absolute: 0.6 10*3/uL (ref 0.1–1.0)
NEUTROS PCT: 43 % (ref 43.0–77.0)
Neutro Abs: 2.8 10*3/uL (ref 1.4–7.7)
PLATELETS: 225 10*3/uL (ref 150.0–400.0)
RBC: 4.61 Mil/uL (ref 4.22–5.81)
RDW: 12.6 % (ref 11.5–15.5)
WBC: 6.5 10*3/uL (ref 4.0–10.5)

## 2018-07-01 LAB — URINALYSIS
Bilirubin Urine: NEGATIVE
Hgb urine dipstick: NEGATIVE
KETONES UR: NEGATIVE
Leukocytes, UA: NEGATIVE
Nitrite: NEGATIVE
PH: 5 (ref 5.0–8.0)
TOTAL PROTEIN, URINE-UPE24: NEGATIVE
URINE GLUCOSE: NEGATIVE
UROBILINOGEN UA: 0.2 (ref 0.0–1.0)

## 2018-07-01 LAB — COMPREHENSIVE METABOLIC PANEL
ALT: 18 U/L (ref 0–53)
AST: 17 U/L (ref 0–37)
Albumin: 4.6 g/dL (ref 3.5–5.2)
Alkaline Phosphatase: 55 U/L (ref 39–117)
BUN: 20 mg/dL (ref 6–23)
CO2: 27 meq/L (ref 19–32)
Calcium: 9.7 mg/dL (ref 8.4–10.5)
Chloride: 106 mEq/L (ref 96–112)
Creatinine, Ser: 1.02 mg/dL (ref 0.40–1.50)
GFR: 77.88 mL/min (ref >60.00)
GLUCOSE: 100 mg/dL — AB (ref 70–99)
POTASSIUM: 4.4 meq/L (ref 3.5–5.1)
SODIUM: 142 meq/L (ref 135–145)
TOTAL PROTEIN: 7.1 g/dL (ref 6.0–8.3)
Total Bilirubin: 0.9 mg/dL (ref 0.2–1.2)

## 2018-07-01 LAB — SEDIMENTATION RATE: Sed Rate: 6 mm/hr (ref 0–20)

## 2018-07-01 LAB — C-REACTIVE PROTEIN: CRP: 0.5 mg/dL (ref 0.5–20.0)

## 2018-07-01 LAB — URIC ACID: Uric Acid, Serum: 4.6 mg/dL (ref 4.0–7.8)

## 2018-07-01 MED ORDER — GABAPENTIN 100 MG PO CAPS: 200.0000 mg | ORAL_CAPSULE | Freq: Every day | ORAL | 3 refills | Status: DC

## 2018-07-01 MED ORDER — KETOROLAC TROMETHAMINE 60 MG/2ML IM SOLN
60.0000 mg | Freq: Once | INTRAMUSCULAR | Status: AC
  Administered 2018-07-01: 60 mg via INTRAMUSCULAR

## 2018-07-01 MED ORDER — PREDNISONE 20 MG PO TABS: 40.0000 mg | ORAL_TABLET | Freq: Every day | ORAL | 0 refills | Status: DC

## 2018-07-01 MED ORDER — METHYLPREDNISOLONE ACETATE 80 MG/ML IJ SUSP
80.0000 mg | Freq: Once | INTRAMUSCULAR | Status: AC
  Administered 2018-07-01: 80 mg via INTRAMUSCULAR

## 2018-07-01 NOTE — Assessment & Plan Note (Signed)
Patient is having sinus symptoms consistent with a lumbar radiculopathy on the left side.  Patient has had kidney stones and will get labs to rule out any recurrent infection.  No fevers today.  Patient does seem to be having more of a colicky type discomfort.  Discussed with patient about icing regimen, home exercise, which activities to do which wants to avoid.  Prednisone given and warned to monitor blood glucose.  Follow-up again in 1 week.  X-rays pending.

## 2018-07-01 NOTE — Progress Notes (Signed)
[name] [place], [place] [address] Phone: [phone] Subjective:      CC: Back pain  OEV:[id number]  Brian Ayers is a 67 y.o. male coming in with complaint of back pain. States he twisted wrong when he felt the pain. Pain radiates down the back of the left leg. States that most of his pain is across the kidneys. Just passed a kidney stone in march. Has xrays. Was told he has arthritis. When he feels the pain he can barely walk. Says that it feels similar to kidney stone.  Sometimes difficult finding a comfortable position.  Onset- Last tuesday Location- Lower back Duration- Periodically Character- Sharp Aggravating factors- Twisting, flexion, extension Reliving factors-sometimes moving, sometimes staying still.  Denies any fevers or chills Therapies tried- [place], Estate agent out of 10     Past Medical History:  Diagnosis Date  . Alcohol abuse    hixtory of  . Allergy   . Asthma childhood  . Cancer ([place])    skin cancer on face X1  . Diabetes mellitus without complication ([place])   . GERD (gastroesophageal reflux disease)   . History of kidney stones   . Hx of colonic polyps   . Hyperlipidemia   . Rhinitis    on allery shots   Past Surgical History:  Procedure Laterality Date  . EXTRACORPOREAL SHOCK WAVE LITHOTRIPSY Right 03/14/2018   Procedure: RIGHT EXTRACORPOREAL SHOCK WAVE LITHOTRIPSY (ESWL);  Surgeon: Raynelle Bring, MD;  Location: [facility];  Service: Urology;  Laterality: Right;  . hernia with hydrocele  1973  . left shoulder pin setting  1978  . TONSILLECTOMY  1967  . WISDOM TOOTH EXTRACTION  2004   Social History   Socioeconomic History  . Marital status: Married    Spouse name: Not on file  . Number of children: 0  . Years of education: Not on file  . Highest education level: Not on file  Occupational History  . Occupation: Sport and exercise psychologist  Social Needs  . Financial resource strain:  Not on file  . Food insecurity:    Worry: Not on file    Inability: Not on file  . Transportation needs:    Medical: Not on file    Non-medical: Not on file  Tobacco Use  . Smoking status: Former Smoker    Last attempt to quit: 12/18/1985    Years since quitting: 32.5  . Smokeless tobacco: Former Systems developer    Types: Snuff, Chew    Quit date: 12/18/1985  . Tobacco comment: only for a short period in the 1980's  Substance and Sexual Activity  . Alcohol use: No    Comment: history of ETOH abuse  . Drug use: No  . Sexual activity: Never    Partners: Female    Comment: abstinent since 2006  Lifestyle  . Physical activity:    Days per week: Not on file    Minutes per session: Not on file  . Stress: Not on file  Relationships  . Social connections:    Talks on phone: Not on file    Gets together: Not on file    Attends religious service: Not on file    Active member of club or organization: Not on file    Attends meetings of clubs or organizations: Not on file    Relationship status: Not on file  Other Topics Concern  . 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 Marriage is in good health. 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      Regular exercise: daily/walk at gym   Caffeine use: 2 cups of coffee daily         Allergies  Allergen Reactions  . Pseudoephedrine     REACTION: tachycardia   Family History  Problem Relation Age of Onset  . Cancer Mother        breast (hx of)  . Other Mother        trigeminal neuralgia s/p gamma knife/ CHF  . Hypertension Father   . Diabetes Neg Hx   . Colon cancer Neg Hx      Past medical history, social, surgical and family history all reviewed in electronic medical record.  No pertanent information unless stated regarding to the chief complaint.   Review of Systems:Review of systems updated and as accurate as of 07/01/18  No headache, visual changes, nausea, vomiting,  diarrhea, constipation, dizziness, abdominal pain, skin rash, fevers, chills, night sweats, weight loss, swollen lymph nodes,  joint swelling,  chest pain, shortness of breath, mood changes.  Positive muscle aches and body aches  Objective  Blood pressure (!) 123/45, height 5\' 10"  (1.778 m). Systems examined below as of 07/01/18   General: No apparent distress alert and oriented x3 mood and affect normal, dressed appropriately.  HEENT: Pupils equal, extraocular movements intact  Respiratory: Patient's speak in full sentences and does not appear short of breath  Cardiovascular: No lower extremity edema, non tender, no erythema  Skin: Warm dry intact with no signs of infection or rash on extremities or on axial skeleton.  Abdomen: Soft nontender  Neuro: Cranial nerves II through XII are intact, neurovascularly intact in all extremities with 2+ DTRs and 2+ pulses.  Lymph: No lymphadenopathy of posterior or anterior cervical chain or axillae bilaterally.  Gait normal with good balance and coordination.  MSK:  Non tender with full range of motion and good stability and symmetric strength and tone of shoulders, elbows, wrist, hip, knee and ankles bilaterally.  Back Exam:  Inspection: Loss of lordosis Motion: Flexion 25 deg, Extension 25 deg, Side Bending to 35 deg bilaterally,  Rotation to 40 deg bilaterally  SLR laying: Positive left XSLR laying: Negative  Palpable tenderness: Tender to palpation of the paraspinal musculature lumbar spine right greater than left. FABER: Tightness bilaterally. Sensory change: Gross sensation intact to all lumbar and sacral dermatomes.  Reflexes: 2+ at both patellar tendons, 2+ at achilles tendons, Babinski's downgoing.  Strength at foot  Plantar-flexion: 5/5 Dorsi-flexion: 5/5 Eversion: 5/5 Inversion: 5/5  Leg strength  Quad: 5/5 Hamstring: 5/5 Hip flexor: 5/5 Hip abductors: 4/5 but symmetric Gait mild antalgic.      Impression and Recommendations:      This case required medical decision making of moderate complexity.      Note: This dictation was prepared with Dragon dictation along with smaller phrase technology. Any transcriptional errors that result from this process are unintentional.

## 2018-07-01 NOTE — Patient Instructions (Addendum)
Good to see you  Ice 20 minutes 2 times daily. Usually after activity and before bed. 2 injection today  Starting tomorrow prednisone daily for 5 days  Gabapentin 200mg  at night Labs downstairs to make sure not another kidney stone.  See me again in 1 week (ok to double book)

## 2018-07-02 ENCOUNTER — Ambulatory Visit (INDEPENDENT_AMBULATORY_CARE_PROVIDER_SITE_OTHER)
Admission: RE | Admit: 2018-07-02 | Discharge: 2018-07-02 | Disposition: A | Discharge status: Home / Self Care | Payer: Medicare Other | Source: Ambulatory Visit | Attending: Family Medicine | Admitting: Family Medicine

## 2018-07-02 DIAGNOSIS — M545 Low back pain: Secondary | ICD-10-CM

## 2018-07-03 LAB — VITAMIN D 1,25 DIHYDROXY
VITAMIN D 1, 25 (OH) TOTAL: 51 pg/mL (ref 18–72)
VITAMIN D3 1, 25 (OH): 51 pg/mL
Vitamin D2 1, 25 (OH)2: <8 pg/mL

## 2018-07-05 NOTE — Progress Notes (Signed)
[name] [place], [place] [address] Phone: [phone] Subjective:     CC: Low back pain  ZWC:[id number]  Brian Ayers is a 67 y.o. male coming in with complaint of back pain and left sided leg pain. States his pain was better with the injection and the meds. Saturday is when his Prednisone ended. Pain returned on Sunday but is not as bad as before.  Patient states feeling approximately 85% better.  Patient states still some radicular symptoms.     Past Medical History:  Diagnosis Date  . Alcohol abuse    hixtory of  . Allergy   . Asthma childhood  . Cancer ([place])    skin cancer on face X1  . Diabetes mellitus without complication ([place])   . GERD (gastroesophageal reflux disease)   . History of kidney stones   . Hx of colonic polyps   . Hyperlipidemia   . Rhinitis    on allery shots   Past Surgical History:  Procedure Laterality Date  . EXTRACORPOREAL SHOCK WAVE LITHOTRIPSY Right 03/14/2018   Procedure: RIGHT EXTRACORPOREAL SHOCK WAVE LITHOTRIPSY (ESWL);  Surgeon: Raynelle Bring, MD;  Location: [facility];  Service: Urology;  Laterality: Right;  . hernia with hydrocele  1973  . left shoulder pin setting  1978  . TONSILLECTOMY  1967  . WISDOM TOOTH EXTRACTION  2004   Social History   Socioeconomic History  . Marital status: Married    Spouse name: Not on file  . Number of children: 0  . Years of education: Not on file  . Highest education level: Not on file  Occupational History  . Occupation: Sport and exercise psychologist  Social Needs  . Financial resource strain: Not on file  . Food insecurity:    Worry: Not on file    Inability: Not on file  . Transportation needs:    Medical: Not on file    Non-medical: Not on file  Tobacco Use  . Smoking status: Former Smoker    Last attempt to quit: 12/18/1985    Years since quitting: 32.5  . Smokeless tobacco: Former Systems developer    Types: Snuff, Chew    Quit date: 12/18/1985  . Tobacco  comment: only for a short period in the 1980's  Substance and Sexual Activity  . Alcohol use: No    Comment: history of ETOH abuse  . Drug use: No  . Sexual activity: Never    Partners: Female    Comment: abstinent since 2006  Lifestyle  . Physical activity:    Days per week: Not on file    Minutes per session: Not on file  . Stress: Not on file  Relationships  . Social connections:    Talks on phone: Not on file    Gets together: Not on file    Attends religious service: Not on file    Active member of club or organization: Not on file    Attends meetings of clubs or organizations: Not on file    Relationship status: Not on file  Other Topics Concern  . 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 Marriage is in good health. 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      Regular exercise: daily/walk at gym   Caffeine use: 2 cups of coffee daily         Allergies  Allergen Reactions  . Pseudoephedrine  REACTION: tachycardia   Family History  Problem Relation Age of Onset  . Cancer Mother        breast (hx of)  . Other Mother        trigeminal neuralgia s/p gamma knife/ CHF  . Hypertension Father   . Diabetes Neg Hx   . Colon cancer Neg Hx      Past medical history, social, surgical and family history all reviewed in electronic medical record.  No pertanent information unless stated regarding to the chief complaint.   Review of Systems:Review of systems updated and as accurate as of 07/08/18  No headache, visual changes, nausea, vomiting, diarrhea, constipation, dizziness, abdominal pain, skin rash, fevers, chills, night sweats, weight loss, swollen lymph nodes, body aches, joint swelling, muscle aches, chest pain, shortness of breath, mood changes.   Objective  Blood pressure (!) 160/70, pulse 71, height 5\' 10"  (1.778 m), weight 207 lb (93.9 kg), SpO2 98 %. Systems examined below as of 07/08/18     General: No apparent distress alert and oriented x3 mood and affect normal, dressed appropriately.  HEENT: Pupils equal, extraocular movements intact  Respiratory: Patient's speak in full sentences and does not appear short of breath  Cardiovascular: No lower extremity edema, non tender, no erythema  Skin: Warm dry intact with no signs of infection or rash on extremities or on axial skeleton.  Abdomen: Soft nontender  Neuro: Cranial nerves II through XII are intact, neurovascularly intact in all extremities with 2+ DTRs and 2+ pulses.  Lymph: No lymphadenopathy of posterior or anterior cervical chain or axillae bilaterally.  Gait normal with good balance and coordination.  MSK:  Non tender with full range of motion and good stability and symmetric strength and tone of shoulders, elbows, wrist, hip, knee and ankles bilaterally.   Back Exam:  Inspection: Unremarkable  Motion: Flexion 40 deg, Extension 25 deg, Side Bending to 35 deg bilaterally,  Rotation to 45 deg bilaterally  SLR laying: Positive left XSLR laying: Negative  Palpable tenderness: Tender to palpation the paraspinal musculature on the left side.Marland Kitchen FABER: Patient does have tightness on the left Sensory change: Gross sensation intact to all lumbar and sacral dermatomes.  Reflexes: 2+ at both patellar tendons, 2+ at achilles tendons, Babinski's downgoing.  Strength at foot  Plantar-flexion: 5/5 Dorsi-flexion: 5/5 Eversion: 5/5 Inversion: 5/5  Leg strength  Quad: 5/5 Hamstring: 5/5 Hip flexor: 5/5 Hip abductors: 4/5  Gait unremarkable.    Impression and Recommendations:     This case required medical decision making of moderate complexity.      Note: This dictation was prepared with Dragon dictation along with smaller phrase technology. Any transcriptional errors that result from this process are unintentional.

## 2018-07-08 ENCOUNTER — Ambulatory Visit (INDEPENDENT_AMBULATORY_CARE_PROVIDER_SITE_OTHER): Payer: Medicare Other | Admitting: Family Medicine

## 2018-07-08 ENCOUNTER — Encounter: Payer: Self-pay | Admitting: Family Medicine

## 2018-07-08 VITALS — BP 160/70 | HR 71 | Ht 70.0 in | Wt 207.0 lb

## 2018-07-08 DIAGNOSIS — J3089 Other allergic rhinitis: Secondary | ICD-10-CM | POA: Diagnosis not present

## 2018-07-08 DIAGNOSIS — M5416 Radiculopathy, lumbar region: Secondary | ICD-10-CM

## 2018-07-08 DIAGNOSIS — J3081 Allergic rhinitis due to animal (cat) (dog) hair and dander: Secondary | ICD-10-CM | POA: Diagnosis not present

## 2018-07-08 DIAGNOSIS — J301 Allergic rhinitis due to pollen: Secondary | ICD-10-CM | POA: Diagnosis not present

## 2018-07-08 DIAGNOSIS — M545 Low back pain: Secondary | ICD-10-CM | POA: Diagnosis not present

## 2018-07-08 MED ORDER — MELOXICAM 15 MG PO TABS: 15.0000 mg | ORAL_TABLET | Freq: Every day | ORAL | 0 refills | Status: DC

## 2018-07-08 NOTE — Patient Instructions (Signed)
Good to see you  Brian Ayers is your friend.  Stay active.  Exercises 3 times a week.  PT will be calling you  Gabapentin 200mg  at night can help  Meloxicam daily for 10 days then as needed See me again in 3 weeks

## 2018-07-08 NOTE — Assessment & Plan Note (Signed)
Patient did respond very well to prednisone previously.  Is 85% better but still having radicular symptoms down the left leg.  Positive straight leg test that is still concerning.  Mild weakness in lower extremities but deep tendon reflexes seem to be intact.  Patient will continue on the gabapentin at night, sent to formal physical therapy.  Discussed continued conservative therapy and what her signs to seek medical attention.  Follow-up again in 4 to 8 weeks

## 2018-07-10 DIAGNOSIS — L821 Other seborrheic keratosis: Secondary | ICD-10-CM | POA: Diagnosis not present

## 2018-07-10 DIAGNOSIS — L820 Inflamed seborrheic keratosis: Secondary | ICD-10-CM | POA: Diagnosis not present

## 2018-07-10 DIAGNOSIS — D485 Neoplasm of uncertain behavior of skin: Secondary | ICD-10-CM | POA: Diagnosis not present

## 2018-07-15 ENCOUNTER — Ambulatory Visit: Payer: Medicare Other | Attending: Family Medicine | Admitting: Physical Therapy

## 2018-07-15 ENCOUNTER — Encounter: Payer: Self-pay | Admitting: Physical Therapy

## 2018-07-15 DIAGNOSIS — M5442 Lumbago with sciatica, left side: Secondary | ICD-10-CM | POA: Insufficient documentation

## 2018-07-15 DIAGNOSIS — J3081 Allergic rhinitis due to animal (cat) (dog) hair and dander: Secondary | ICD-10-CM | POA: Diagnosis not present

## 2018-07-15 DIAGNOSIS — J301 Allergic rhinitis due to pollen: Secondary | ICD-10-CM | POA: Diagnosis not present

## 2018-07-15 DIAGNOSIS — J3089 Other allergic rhinitis: Secondary | ICD-10-CM | POA: Diagnosis not present

## 2018-07-15 NOTE — Patient Instructions (Signed)
Access Code: [email or web address]  URL: [email or web address]  Date: 07/15/2018  Prepared by: Earlie Counts   Exercises  Right Standing Lateral Shift Correction at [place] 1 reps - 1 sets - 1 min hold - 1x daily - 7x weekly  [facility] [address], [place], [place] [address] Phone # [phone] Fax [phone]

## 2018-07-15 NOTE — Therapy (Signed)
[facility] [address] [place], [place], [address] Phone: [phone]   Fax:  [phone]  Physical Therapy Evaluation  Patient Details  Name: Brian Ayers MRN: [id number] Date of Birth: 07-13-1951 Referring Provider: Dr. Olevia Bowens. Tamala Julian   Encounter Date: 07/15/2018  PT End of Session - 07/15/18 1529    Visit Number  1    Date for PT Re-Evaluation  08/12/18    Authorization Type  Medicare/BCBS    PT Start Time  1445    PT Stop Time  1529    PT Time Calculation (min)  44 min    Activity Tolerance  Patient tolerated treatment well    Behavior During Therapy  [facility] for tasks assessed/performed       Past Medical History:  Diagnosis Date  . Alcohol abuse    hixtory of  . Allergy   . Asthma childhood  . Cancer ([place])    skin cancer on face X1  . Diabetes mellitus without complication ([place])   . GERD (gastroesophageal reflux disease)   . History of kidney stones   . Hx of colonic polyps   . Hyperlipidemia   . Rhinitis    on allery shots    Past Surgical History:  Procedure Laterality Date  . EXTRACORPOREAL SHOCK WAVE LITHOTRIPSY Right 03/14/2018   Procedure: RIGHT EXTRACORPOREAL SHOCK WAVE LITHOTRIPSY (ESWL);  Surgeon: Raynelle Bring, MD;  [facility];  Service: Urology;  Laterality: Right;  . hernia with hydrocele  1973  . left shoulder pin setting  1978  . TONSILLECTOMY  1967  . [place] EXTRACTION  2004    There were no vitals filed for this visit.   Subjective Assessment - 07/15/18 1451    Subjective  Back pain started 2 weeks ago was mowing and jerked due to a loud noise. intermittent pain down left leg to left heel.     Currently in Pain?  Yes    Pain Score  2     Pain Location  Back    Pain Orientation  Lower    Pain Descriptors / Indicators  Dull    Pain Type  Acute pain    Pain Radiating Towards  intermittent pain down left leg    Pain Onset  1 to 4 weeks ago    Pain Frequency   Intermittent    Aggravating Factors   getting out of truck and slide down seat. pulling things, moving furniture, walking around track     Pain Relieving Factors  medication    Multiple Pain Sites  No         [facility] PT Assessment - 07/15/18 0001      Assessment   Medical Diagnosis  M54.5 low back pain, unspecified back pain laterality, unspecified chronicity with sciatica presence    Referring Provider  Dr. Olevia Bowens. Smith    Onset Date/Surgical Date  07/01/18    Prior Therapy  none      Precautions   Precautions  None      Restrictions   Weight Bearing Restrictions  No      Balance Screen   Has the patient fallen in the past 6 months  No    Has the patient had a decrease in activity level because of a fear of falling?   No    Is the patient reluctant to leave their home because of a fear of falling?   No      Home Environment  Living Environment  Private residence      Prior Function   Level of [place]  Retired    Leisure  workout at gym, mow, wash car, bicycle trailbike      Cognition   Overall Cognitive Status  Within Functional Limits for tasks assessed      Observation/Other Assessments   Focus on Therapeutic Outcomes (FOTO)   46% limitation' goal is 25% limitation      Posture/Postural Control   Posture/Postural Control  No significant limitations      ROM / Strength   AROM / PROM / Strength  AROM;PROM;Strength      AROM   Lumbar - Right Side Bend  decresaed by 25%    Lumbar - Left Side Bend  full with pain into left leg      PROM   Left Hip External Rotation   35    Left Hip Internal Rotation   5      Special Tests    Special Tests  Lumbar      Slump test   Findings  Negative    Side  Left    Comment  no pain      Straight Leg Raise   Findings  Positive    Side   Left    Comment  55 degrees                Objective measurements completed on examination: See above findings.               PT Education - 07/15/18 1529    Education Details  lateral shift, reviewed MD HEP; Access Code: [email or web address]     Person(s) Educated  Patient    Methods  Explanation;Demonstration;Verbal cues;Handout    Comprehension  Verbalized understanding;Returned demonstration          PT Long Term Goals - 07/15/18 1526      PT LONG TERM GOAL #1   Title  return to bike riding on the trails for 1 hour with minimal to no pain    Time  4    Period  Weeks    Status  New    Target Date  08/12/18      PT LONG TERM GOAL #2   Title  ability to weeding and mowing the steep yard with minimal to no pain    Time  4    Period  Weeks    Status  New    Target Date  08/12/18      PT LONG TERM GOAL #3   Title  understand correct body mechinics with daily tasks to reduce re-injury    Time  4    Period  Weeks    Status  New    Target Date  08/12/18      PT LONG TERM GOAL #4   Title  hiking in the woods for 1 hour with minimal to no pain due to centralization of pain    Time  4    Period  Weeks    Status  New    Target Date  08/12/18      PT LONG TERM GOAL #5   Title  Independent with HEP     Time  4    Period  Weeks    Status  New    Target Date  08/12/18             Plan - 07/15/18 1530  Clinical Impression Statement  Patient is a 66 year old male with back pain that started 2 weeks ago when he quickly turned due to a loud noise .  Patient reports back pain that leads into the left leg is 2/10 intemittently.  Patient pain is worse with bending over, working in the [place] around the track.  Patient lumbar right sidebending is decreased by 25% and left sidebending is full with pain. Positive straight leg raise on left at 55 degrees.  Decreased mobility of L1-L5.  Patient has decreased mobility of left hip external and internal rotation. Patient will benefit from skilled therapy to improve trunk mobility and reduce pain with centralization exercises.     History  and Personal Factors relevant to plan of care:  Diabetes, skin cancer    Clinical Presentation  Stable    Clinical Presentation due to:  stable condition    Clinical Decision Making  Low    Rehab Potential  Excellent    Clinical Impairments Affecting Rehab Potential  Diabetes, skin cancer    PT Frequency  2x / week    PT Duration  4 weeks    PT Treatment/Interventions  Cryotherapy;Electrical Stimulation;Moist Heat;Traction;Therapeutic activities;Therapeutic exercise;Patient/family education;Neuromuscular re-education;Manual techniques;Dry needling    PT Next Visit Plan  continue with [place] and see if ready for trunk extension; soft tissue work; lumbar joint mobilization; body mechanics, neural stretch on left    PT Home Exercise Plan  Access Code: [email or web address]     Consulted and Agree with Plan of Care  Patient       Patient will benefit from skilled therapeutic intervention in order to improve the following deficits and impairments:  Increased fascial restricitons, Pain, Decreased mobility, Increased muscle spasms, Decreased activity tolerance, Decreased range of motion  Visit Diagnosis: Acute left-sided low back pain with left-sided sciatica - Plan: PT plan of care cert/re-cert     Problem List Patient Active Problem List   Diagnosis Date Noted  . Lumbar back pain with radiculopathy affecting left lower extremity 07/01/2018  . Renal stone 04/16/2018  . Erectile dysfunction 11/06/2016  . Tinea corporis 11/06/2016  . Asthma, chronic 03/13/2016  . Trigger finger, acquired 01/12/2015  . Elevated BP 09/09/2014  . Metatarsalgia 08/10/2014  . Diabetes type 2, controlled ([place]) 06/23/2014  . Well adult exam 05/25/2014  . GERD (gastroesophageal reflux disease) 05/25/2014  . Elevated blood pressure reading without diagnosis of hypertension 11/01/2012  . Routine health maintenance 09/14/2011  . COLONIC POLYPS, HX OF 05/17/2008  . PLANTAR FASCIITIS, BILATERAL 05/15/2008  . ILIOTIBIAL BAND  SYNDROME, LEFT KNEE 05/15/2008  . TALIPES CAVUS 05/15/2008  . ALCOHOL ABUSE, IN REMISSION, HX OF 04/28/2008  . Dyslipidemia 09/16/2007  . RHINITIS 09/16/2007    Brian Ayers, PT 07/15/18 3:39 PM   [facility] [address] [place], [place], [address] Phone: [phone]   Fax:  [phone]  Name: Brian Ayers MRN: [id number] Date of Birth: [birth year]

## 2018-07-16 ENCOUNTER — Other Ambulatory Visit (INDEPENDENT_AMBULATORY_CARE_PROVIDER_SITE_OTHER): Payer: Medicare Other

## 2018-07-16 DIAGNOSIS — E119 Type 2 diabetes mellitus without complications: Secondary | ICD-10-CM | POA: Diagnosis not present

## 2018-07-16 LAB — BASIC METABOLIC PANEL
BUN: 24 mg/dL — ABNORMAL HIGH (ref 6–23)
CHLORIDE: 103 meq/L (ref 96–112)
CO2: 25 meq/L (ref 19–32)
CREATININE: 1.01 mg/dL (ref 0.40–1.50)
Calcium: 9.1 mg/dL (ref 8.4–10.5)
GFR: 78.76 mL/min (ref >60.00)
Glucose, Bld: 139 mg/dL — ABNORMAL HIGH (ref 70–99)
Potassium: 4.1 mEq/L (ref 3.5–5.1)
Sodium: 138 mEq/L (ref 135–145)

## 2018-07-16 LAB — HEMOGLOBIN A1C: HEMOGLOBIN A1C: 7.2 % — AB (ref 4.6–6.5)

## 2018-07-18 ENCOUNTER — Ambulatory Visit (INDEPENDENT_AMBULATORY_CARE_PROVIDER_SITE_OTHER): Payer: Medicare Other | Admitting: Internal Medicine

## 2018-07-18 ENCOUNTER — Encounter: Payer: Self-pay | Admitting: Internal Medicine

## 2018-07-18 ENCOUNTER — Encounter: Payer: Self-pay | Admitting: Physical Therapy

## 2018-07-18 ENCOUNTER — Ambulatory Visit: Payer: Medicare Other | Attending: Family Medicine | Admitting: Physical Therapy

## 2018-07-18 DIAGNOSIS — E119 Type 2 diabetes mellitus without complications: Secondary | ICD-10-CM | POA: Diagnosis not present

## 2018-07-18 DIAGNOSIS — R030 Elevated blood-pressure reading, without diagnosis of hypertension: Secondary | ICD-10-CM | POA: Diagnosis not present

## 2018-07-18 DIAGNOSIS — M5416 Radiculopathy, lumbar region: Secondary | ICD-10-CM | POA: Diagnosis not present

## 2018-07-18 DIAGNOSIS — M5442 Lumbago with sciatica, left side: Secondary | ICD-10-CM | POA: Diagnosis not present

## 2018-07-18 DIAGNOSIS — E785 Hyperlipidemia, unspecified: Secondary | ICD-10-CM | POA: Diagnosis not present

## 2018-07-18 NOTE — Assessment & Plan Note (Signed)
Slow improvementt

## 2018-07-18 NOTE — Progress Notes (Signed)
Subjective:  Patient ID: Brian Ayers, male    DOB: [birth year]  Age: 67 y.o. MRN: [id number]  CC: No chief complaint on file.   HPI Brian Ayers presents for DM, HTN, dyslipidemia In PT for LBP. He was on Prednisone x 5 d On Mobic, Gabapentin  Outpatient Medications Prior to Visit  Medication Sig Dispense Refill  . ADVAIR DISKUS 100-50 MCG/DOSE AEPB INHALE 1 PUFF INTO THE LUNGS TWO TIMES DAILY (Patient taking differently: INHALE 1 PUFF INTO THE LUNGS TWO TIMES DAILY as needed) 180 each 1  . aspirin 81 MG chewable tablet Chew 81 mg by mouth daily.    . Azelastine-Fluticasone (DYMISTA) 137-50 MCG/ACT SUSP 1 spr each nostril bid (Patient taking differently: 1 spr each nostril bid as needed) 3 Bottle 3  . Biotin 2500 MCG CAPS Take 2,500 mcg by mouth daily.     Marland Kitchen docusate sodium (COLACE) 250 MG capsule Take 1 capsule (250 mg total) by mouth daily. 10 capsule 0  . EPINEPHrine 0.3 mg/0.3 mL IJ SOAJ injection as needed.  0  . gabapentin (NEURONTIN) 100 MG capsule Take 2 capsules (200 mg total) by mouth at bedtime. 60 capsule 3  . glucose blood (ONE TOUCH ULTRA TEST) test strip Use to check blood sugars twice a day 100 each 5  . HYDROcodone-acetaminophen (NORCO/VICODIN) 5-325 MG tablet Take 1-2 tablets by mouth every 8 (eight) hours as needed for moderate pain.    Marland Kitchen loratadine (CLARITIN) 10 MG tablet TAKE 1 TABLET (10 MG TOTAL) BY MOUTH DAILY. 100 tablet 3  . meloxicam (MOBIC) 15 MG tablet Take 1 tablet (15 mg total) by mouth daily. 30 tablet 0  . metFORMIN (GLUCOPHAGE) 500 MG tablet TAKE 1 TABLET (500 MG TOTAL) BY MOUTH 2 (TWO) TIMES DAILY WITH A MEAL. 180 tablet 3  . ondansetron (ZOFRAN) 4 MG tablet Take 4 mg by mouth as needed for nausea or vomiting.    Glory Rosebush DELICA LANCETS FINE MISC USE TO CHECK BS TWICE A DAY 100 each 5  . ONETOUCH VERIO test strip USE TO CHECK BLOOD SUGAR TWICE A DAY DX E11.9 100 each 3  . PROAIR HFA 108 (90 BASE) MCG/ACT inhaler as needed.  0  . ranitidine  (ZANTAC) 150 MG tablet Take 1 tablet (150 mg total) by mouth 2 (two) times daily. (Patient taking differently: Take 150 mg by mouth daily. ) 180 tablet 3  . rosuvastatin (CRESTOR) 10 MG tablet Take 1 tablet (10 mg total) by mouth daily. 90 tablet 3  . sildenafil (VIAGRA) 100 MG tablet Take 1 tablet (100 mg total) by mouth as needed for erectile dysfunction. 12 tablet 11  . UNABLE TO FIND Med Name: Takes allergy shots weekly    . predniSONE (DELTASONE) 20 MG tablet Take 2 tablets (40 mg total) by mouth daily with breakfast. (Patient not taking: Reported on 07/15/2018) 10 tablet 0   No facility-administered medications prior to visit.     ROS: Review of Systems  Constitutional: Negative for appetite change, fatigue and unexpected weight change.  HENT: Negative for congestion, nosebleeds, sneezing, sore throat and trouble swallowing.   Eyes: Negative for itching and visual disturbance.  Respiratory: Negative for cough.   Cardiovascular: Negative for chest pain, palpitations and leg swelling.  Gastrointestinal: Negative for abdominal distention, blood in stool, diarrhea and nausea.  Genitourinary: Negative for frequency and hematuria.  Musculoskeletal: Negative for back pain, gait problem, joint swelling and neck pain.  Skin: Negative for rash.  Neurological: Negative for  dizziness, tremors, speech difficulty and weakness.  Psychiatric/Behavioral: Negative for agitation, dysphoric mood, sleep disturbance and suicidal ideas. The patient is not nervous/anxious.     Objective:  BP (!) 160/92 (BP Location: Left Arm, Patient Position: Sitting, Cuff Size: Normal)   Pulse 63   Temp 98.1 F (36.7 C) (Oral)   Ht 5\' 10"  (1.778 m)   Wt 207 lb (93.9 kg)   SpO2 99%   BMI 29.70 kg/m   BP Readings from Last 3 Encounters:  07/18/18 (!) 160/92  07/08/18 (!) 160/70  07/01/18 130/78    Wt Readings from Last 3 Encounters:  07/18/18 207 lb (93.9 kg)  07/08/18 207 lb (93.9 kg)  07/01/18 208 lb (94.3  kg)    Physical Exam  Constitutional: He is oriented to person, place, and time. He appears well-developed. No distress.  NAD  HENT:  Mouth/Throat: Oropharynx is clear and moist.  Eyes: Pupils are equal, round, and reactive to light. Conjunctivae are normal.  Neck: Normal range of motion. No JVD present. No thyromegaly present.  Cardiovascular: Normal rate, regular rhythm, normal heart sounds and intact distal pulses. Exam reveals no gallop and no friction rub.  No murmur heard. Pulmonary/Chest: Effort normal and breath sounds normal. No respiratory distress. He has no wheezes. He has no rales. He exhibits no tenderness.  Abdominal: Soft. Bowel sounds are normal. He exhibits no distension and no mass. There is no tenderness. There is no rebound and no guarding.  Musculoskeletal: Normal range of motion. He exhibits no edema or tenderness.  Lymphadenopathy:    He has no cervical adenopathy.  Neurological: He is alert and oriented to person, place, and time. He has normal reflexes. No cranial nerve deficit. He exhibits normal muscle tone. He displays a negative Romberg sign. Coordination and gait normal.  Skin: Skin is warm and dry. No rash noted.  Psychiatric: He has a normal mood and affect. His behavior is normal. Judgment and thought content normal.    Lab Results  Component Value Date   WBC 6.5 07/01/2018   HGB 14.8 07/01/2018   HCT 42.1 07/01/2018   PLT 225.0 07/01/2018   GLUCOSE 139 (H) 07/16/2018   CHOL 130 03/25/2018   TRIG 75.0 03/25/2018   HDL 44.60 03/25/2018   LDLCALC 71 03/25/2018   ALT 18 07/01/2018   AST 17 07/01/2018   NA 138 07/16/2018   K 4.1 07/16/2018   CL 103 07/16/2018   CREATININE 1.01 07/16/2018   BUN 24 (H) 07/16/2018   CO2 25 07/16/2018   TSH 4.22 03/25/2018   PSA 0.90 03/25/2018   HGBA1C 7.2 (H) 07/16/2018   MICROALBUR <0.7 03/25/2018    Dg Lumbar Spine Complete  Result Date: 07/02/2018 CLINICAL DATA:  Sharp twist in back 6 days ago with  severe left-sided back pain and left leg pain, some numbness EXAM: LUMBAR SPINE - COMPLETE 4+ VIEW COMPARISON:  None. FINDINGS: The lumbar vertebrae are in normal alignment. Mild anterior degenerative spurs are noted from T11-L4. Only the T11-T12 disc space is slightly narrowed. No compression deformity is seen. The SI joints appear corticated. IMPRESSION: 1. Normal alignment with only mild degenerative change. Slight narrowing of T11-T12 disc space. 2. No acute compression deformity. Electronically Signed   By: Ivar Drape M.D.   On: 07/02/2018 10:14    Assessment & Plan:   There are no diagnoses linked to this encounter.   No orders of the defined types were placed in this encounter.    Follow-up: No follow-ups  on file.  Walker Kehr, MD

## 2018-07-18 NOTE — Patient Instructions (Signed)
     Access Code: [email or web address]  URL: [email or web address]  Date: 07/18/2018  Prepared by: Ruben Im   Exercises  Right Standing Lateral Shift Correction at [place] - 1 reps - 1 sets - 1 min hold - 1x daily - 7x weekly  Standing Lumbar Extension - 10 reps - 1 sets - 5x daily - 7x weekly  Prone Press Up - 10 reps - 1 sets - 5x daily - 7x weekly  Supine LE Neural Mobilization - 10 reps - 1 sets - 1x daily - 7x weekly  Supine Piriformis Stretch with Leg Straight - 5 reps - 1 sets - 1x daily - 7x weekly  Cat Cow - 10 reps - 1 sets - 1x daily - 7x weekly  Child's Pose with Sidebending - 5 reps - 1 sets - 1x daily - 7x weekly     [facility] [address] [place], [place] [address] Phone # [phone] Fax [phone]

## 2018-07-18 NOTE — Assessment & Plan Note (Signed)
Home record with a majority of readings below SBP 140 Nl BP at home

## 2018-07-18 NOTE — Therapy (Addendum)
[facility] [address] [place], [place], [address] Phone: [phone]   Fax:  [phone]  Physical Therapy Treatment/Discharge Summary   Patient Details  Name: Brian Ayers MRN: [id number] Date of Birth: 1951/09/06 Referring Provider: Dr. Olevia Bowens. Tamala Julian   Encounter Date: 07/18/2018  PT End of Session - 07/18/18 1453    Visit Number  2    Date for PT Re-Evaluation  08/12/18    Authorization Type  Medicare/BCBS    PT Start Time  1400    PT Stop Time  1438    PT Time Calculation (min)  38 min    Activity Tolerance  Patient tolerated treatment well       Past Medical History:  Diagnosis Date  . Alcohol abuse    hixtory of  . Allergy   . Asthma childhood  . Cancer ([place])    skin cancer on face X1  . Diabetes mellitus without complication ([place])   . GERD (gastroesophageal reflux disease)   . History of kidney stones   . Hx of colonic polyps   . Hyperlipidemia   . Rhinitis    on allery shots    Past Surgical History:  Procedure Laterality Date  . EXTRACORPOREAL SHOCK WAVE LITHOTRIPSY Right 03/14/2018   Procedure: RIGHT EXTRACORPOREAL SHOCK WAVE LITHOTRIPSY (ESWL);  Surgeon: Raynelle Bring, MD;  Location: [facility];  Service: Urology;  Laterality: Right;  . hernia with hydrocele  1973  . left shoulder pin setting  1978  . TONSILLECTOMY  1967  . [place] EXTRACTION  2004    There were no vitals filed for this visit.  Subjective Assessment - 07/18/18 1357    Subjective  Feeling good for the most part.  Things are shifting to the right.  I'm pleased with it.  I can put my socks on now and wash my leg in the shower.  Doing upper trap stretch, press ups,  bridge, bird dog, HS stretch  and wall glides.      Pertinent History  "[place]"      Currently in Pain?  Yes    Pain Score  2     Pain Location  Back    Pain Orientation  Lower                       [facility] Adult PT Treatment/Exercise -  07/18/18 0001      Lumbar Exercises: Stretches   Standing Extension  10 reps    Press Ups  10 reps    Quadruped Mid Back Stretch  5 reps      Lumbar Exercises: Standing   Other Standing Lumbar Exercises  sit to stand 10x    Other Standing Lumbar Exercises  wall slide glides 10x      Lumbar Exercises: Seated   Other Seated Lumbar Exercises  discussion of centralization principles    Other Seated Lumbar Exercises  discussion of walking program       Lumbar Exercises: Supine   Other Supine Lumbar Exercises  neural flossing 10x right/left    Other Supine Lumbar Exercises  knee to chest with bias toward the opp shoulder       Lumbar Exercises: Quadruped   [place]/Old Horse  10 reps      Manual Therapy   Manual therapy comments  lumbar PA grade 3 mobs 10x;  press ups with manual overpressure 10x  PT Education - 07/18/18 1432    Education Details   Access Code: [email or web address]  press ups every 2 hours, standing extensions;  neural flossing; piriformis. cat/cow, childs pose    Person(s) Educated  Patient    Methods  Explanation;Demonstration;Handout    Comprehension  Returned demonstration;Verbalized understanding          PT Long Term Goals - 07/15/18 1526      PT LONG TERM GOAL #1   Title  return to bike riding on the trails for 1 hour with minimal to no pain    Time  4    Period  Weeks    Status  New    Target Date  08/12/18      PT LONG TERM GOAL #2   Title  ability to weeding and mowing the steep yard with minimal to no pain    Time  4    Period  Weeks    Status  New    Target Date  08/12/18      PT LONG TERM GOAL #3   Title  understand correct body mechinics with daily tasks to reduce re-injury    Time  4    Period  Weeks    Status  New    Target Date  08/12/18      PT LONG TERM GOAL #4   Title  hiking in the woods for 1 hour with minimal to no pain due to centralization of pain    Time  4    Period  Weeks    Status  New    Target Date   08/12/18      PT LONG TERM GOAL #5   Title  Independent with HEP     Time  4    Period  Weeks    Status  New    Target Date  08/12/18            Plan - 07/18/18 1421    Clinical Impression Statement  The patient is able to centralize symptoms with extension exercises.  Able to perform low level neural mobility ex without reproduction of pain.  If symptoms remain centralized will progress to core strengthening next visit.  Therapist closely monitoring response with all interventions.      Rehab Potential  Excellent    Clinical Impairments Affecting Rehab Potential  Diabetes, skin cancer    PT Frequency  2x / week    PT Treatment/Interventions  Cryotherapy;Electrical Stimulation;Moist Heat;Traction;Therapeutic activities;Therapeutic exercise;Patient/family education;Neuromuscular re-education;Manual techniques;Dry needling    PT Next Visit Plan  McKenzie ex progression;  initiate core strengthening    PT Home Exercise Plan  Access Code: [email or web address]        Patient will benefit from skilled therapeutic intervention in order to improve the following deficits and impairments:  Increased fascial restricitons, Pain, Decreased mobility, Increased muscle spasms, Decreased activity tolerance, Decreased range of motion  Visit Diagnosis: Acute left-sided low back pain with left-sided sciatica    PHYSICAL THERAPY DISCHARGE SUMMARY  Visits from Start of Care: 2  Current functional level related to goals / functional outcomes: The patient's wife called on his behalf to report [name] is feeling much better and requested discharge from PT.   Remaining deficits: As above    Education / Equipment: Basic HEP Plan: Patient agrees to discharge.  Patient goals were partially met. Patient is being discharged due to not returning since the last visit.  ?????      Problem List Patient  Active Problem List   Diagnosis Date Noted  . Lumbar back pain with radiculopathy affecting left lower  extremity 07/01/2018  . Renal stone 04/16/2018  . Erectile dysfunction 11/06/2016  . Tinea corporis 11/06/2016  . Asthma, chronic 03/13/2016  . Trigger finger, acquired 01/12/2015  . Elevated BP 09/09/2014  . Metatarsalgia 08/10/2014  . Diabetes type 2, controlled ([place]) 06/23/2014  . Well adult exam 05/25/2014  . GERD (gastroesophageal reflux disease) 05/25/2014  . Elevated blood pressure reading without diagnosis of hypertension 11/01/2012  . Routine health maintenance 09/14/2011  . COLONIC POLYPS, HX OF 05/17/2008  . PLANTAR FASCIITIS, BILATERAL 05/15/2008  . ILIOTIBIAL BAND SYNDROME, LEFT KNEE 05/15/2008  . TALIPES CAVUS 05/15/2008  . ALCOHOL ABUSE, IN REMISSION, HX OF 04/28/2008  . Dyslipidemia 09/16/2007  . RHINITIS 09/16/2007   Ruben Im, PT 07/18/18 5:13 PM Phone: [phone] Fax: [phone]  Alvera Singh 07/18/2018, 5:13 PM  [facility] [address] [place], [place], [address] Phone: [phone]   Fax:  [phone]  Name: MILLEDGE GERDING MRN: [id number] Date of Birth: [birth year]

## 2018-07-18 NOTE — Assessment & Plan Note (Addendum)
Worse due to recent Prednisone

## 2018-07-18 NOTE — Assessment & Plan Note (Signed)
-   Crestor 

## 2018-07-22 DIAGNOSIS — J301 Allergic rhinitis due to pollen: Secondary | ICD-10-CM | POA: Diagnosis not present

## 2018-07-22 DIAGNOSIS — J3081 Allergic rhinitis due to animal (cat) (dog) hair and dander: Secondary | ICD-10-CM | POA: Diagnosis not present

## 2018-07-22 DIAGNOSIS — J3089 Other allergic rhinitis: Secondary | ICD-10-CM | POA: Diagnosis not present

## 2018-07-23 ENCOUNTER — Encounter: Payer: Medicare Other | Admitting: Physical Therapy

## 2018-07-25 ENCOUNTER — Ambulatory Visit: Payer: Medicare Other | Admitting: Physical Therapy

## 2018-07-26 DIAGNOSIS — J3081 Allergic rhinitis due to animal (cat) (dog) hair and dander: Secondary | ICD-10-CM | POA: Diagnosis not present

## 2018-07-26 DIAGNOSIS — J3089 Other allergic rhinitis: Secondary | ICD-10-CM | POA: Diagnosis not present

## 2018-07-26 DIAGNOSIS — J301 Allergic rhinitis due to pollen: Secondary | ICD-10-CM | POA: Diagnosis not present

## 2018-07-29 DIAGNOSIS — J3081 Allergic rhinitis due to animal (cat) (dog) hair and dander: Secondary | ICD-10-CM | POA: Diagnosis not present

## 2018-07-29 DIAGNOSIS — J3089 Other allergic rhinitis: Secondary | ICD-10-CM | POA: Diagnosis not present

## 2018-07-29 DIAGNOSIS — J301 Allergic rhinitis due to pollen: Secondary | ICD-10-CM | POA: Diagnosis not present

## 2018-07-30 ENCOUNTER — Encounter: Payer: Medicare Other | Admitting: Physical Therapy

## 2018-07-31 NOTE — Progress Notes (Signed)
[name] [place], [place] [address] Phone: [phone] Subjective:      CC: Back and left leg pain follow-up  [id number]  Brian Ayers is a 67 y.o. male coming in with complaint of back and left leg pain. Back pain is a lot better. Has been doing exercises and PT. states he is feeling 100% better.  Feels that the manipulation was very helpful.  Patient feels the gabapentin and the meloxicam have been helpful but wants to know if he can come off of it     Past Medical History:  Diagnosis Date  . Alcohol abuse    hixtory of  . Allergy   . Asthma childhood  . Cancer ([place])    skin cancer on face X1  . Diabetes mellitus without complication ([place])   . GERD (gastroesophageal reflux disease)   . History of kidney stones   . Hx of colonic polyps   . Hyperlipidemia   . Rhinitis    on allery shots   Past Surgical History:  Procedure Laterality Date  . EXTRACORPOREAL SHOCK WAVE LITHOTRIPSY Right 03/14/2018   Procedure: RIGHT EXTRACORPOREAL SHOCK WAVE LITHOTRIPSY (ESWL);  Surgeon: Raynelle Bring, MD;  Location: [facility];  Service: Urology;  Laterality: Right;  . hernia with hydrocele  1973  . left shoulder pin setting  1978  . TONSILLECTOMY  1967  . WISDOM TOOTH EXTRACTION  2004   Social History   Socioeconomic History  . Marital status: Married    Spouse name: Not on file  . Number of children: 0  . Years of education: Not on file  . Highest education level: Not on file  Occupational History  . Occupation: Sport and exercise psychologist  Social Needs  . Financial resource strain: Not on file  . Food insecurity:    Worry: Not on file    Inability: Not on file  . Transportation needs:    Medical: Not on file    Non-medical: Not on file  Tobacco Use  . Smoking status: Former Smoker    Last attempt to quit: 12/18/1985    Years since quitting: 32.6  . Smokeless tobacco: Former Systems developer    Types: Snuff, Chew    Quit date: 12/18/1985    . Tobacco comment: only for a short period in the 1980's  Substance and Sexual Activity  . Alcohol use: No    Comment: history of ETOH abuse  . Drug use: No  . Sexual activity: Never    Partners: Female    Comment: abstinent since 2006  Lifestyle  . Physical activity:    Days per week: Not on file    Minutes per session: Not on file  . Stress: Not on file  Relationships  . Social connections:    Talks on phone: Not on file    Gets together: Not on file    Attends religious service: Not on file    Active member of club or organization: Not on file    Attends meetings of clubs or organizations: Not on file    Relationship status: Not on file  Other Topics Concern  . 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 Marriage is in good health. 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      Regular exercise: daily/walk at gym   Caffeine use: 2 cups of coffee daily         Allergies  Allergen Reactions  . Pseudoephedrine     REACTION: tachycardia   Family History  Problem Relation Age of Onset  . Cancer Mother        breast (hx of)  . Other Mother        trigeminal neuralgia s/p gamma knife/ CHF  . Hypertension Father   . Diabetes Neg Hx   . Colon cancer Neg Hx      Past medical history, social, surgical and family history all reviewed in electronic medical record.  No pertanent information unless stated regarding to the chief complaint.   Review of Systems:Review of systems updated and as accurate as of 08/01/18  No headache, visual changes, nausea, vomiting, diarrhea, constipation, dizziness, abdominal pain, skin rash, fevers, chills, night sweats, weight loss, swollen lymph nodes, body aches, joint swelling, , chest pain, shortness of breath, mood changes.  Positive muscle aches  Objective  Blood pressure (!) 162/90, pulse 86, height 5\' 10"  (1.778 m), weight 204 lb (92.5 kg), SpO2 98 %. Systems examined below as  of 08/01/18   General: No apparent distress alert and oriented x3 mood and affect normal, dressed appropriately.  HEENT: Pupils equal, extraocular movements intact  Respiratory: Patient's speak in full sentences and does not appear short of breath  Cardiovascular: No lower extremity edema, non tender, no erythema  Skin: Warm dry intact with no signs of infection or rash on extremities or on axial skeleton.  Abdomen: Soft nontender  Neuro: Cranial nerves II through XII are intact, neurovascularly intact in all extremities with 2+ DTRs and 2+ pulses.  Lymph: No lymphadenopathy of posterior or anterior cervical chain or axillae bilaterally.  Gait normal with good balance and coordination.  MSK:  Non tender with full range of motion and good stability and symmetric strength and tone of shoulders, elbows, wrist, hip, knee and ankles bilaterally.  Back Exam:  Inspection: Mild loss of lordosis Motion: Flexion 35 deg, Extension 25 deg, Side Bending to 35 deg bilaterally,  Rotation to 35 deg bilaterally  SLR laying: Negative tightness of the hamstring noted XSLR laying: Negative  Palpable tenderness: Tender to palpation the paraspinal musculature lumbar spine right greater than left. FABER: Positive Faber left. Sensory change: Gross sensation intact to all lumbar and sacral dermatomes.  Reflexes: 2+ at both patellar tendons, 2+ at achilles tendons, Babinski's downgoing.  Strength at foot  Plantar-flexion: 5/5 Dorsi-flexion: 5/5 Eversion: 5/5 Inversion: 5/5  Leg strength  Quad: 5/5 Hamstring: 5/5 Hip flexor: 5/5 Hip abductors: 5/5  Gait unremarkable. Osteopathic findings  T9 extended rotated and side bent left L2 flexed rotated and side bent right Sacrum left on left     Impression and Recommendations:     This case required medical decision making of moderate complexity.      Note: This dictation was prepared with Dragon dictation along with smaller phrase technology. Any  transcriptional errors that result from this process are unintentional.

## 2018-08-01 ENCOUNTER — Ambulatory Visit (INDEPENDENT_AMBULATORY_CARE_PROVIDER_SITE_OTHER): Payer: Medicare Other | Admitting: Family Medicine

## 2018-08-01 ENCOUNTER — Encounter: Payer: Self-pay | Admitting: Family Medicine

## 2018-08-01 ENCOUNTER — Encounter: Payer: Medicare Other | Admitting: Physical Therapy

## 2018-08-01 ENCOUNTER — Other Ambulatory Visit: Payer: Self-pay | Admitting: Family Medicine

## 2018-08-01 DIAGNOSIS — M5416 Radiculopathy, lumbar region: Secondary | ICD-10-CM | POA: Diagnosis not present

## 2018-08-01 NOTE — Patient Instructions (Signed)
Good to see you  Brian Ayers is your friend.  Gabapentin 100mg  at night for 2 weeks and see how you feel.  If worse go back to 200mg  if better then stop  Meloxicam only when in pain and then take daily for 5 days See me again in 3 months!

## 2018-08-01 NOTE — Assessment & Plan Note (Signed)
Low back pain.  Patient no longer has any radicular symptoms but still tight hamstring.  Responds well to osteopathic manipulation.  Patient will titrate down off the gabapentin and the meloxicam.  Follow-up again in 3 months

## 2018-08-02 ENCOUNTER — Other Ambulatory Visit: Payer: Self-pay | Admitting: Internal Medicine

## 2018-08-05 DIAGNOSIS — J3081 Allergic rhinitis due to animal (cat) (dog) hair and dander: Secondary | ICD-10-CM | POA: Diagnosis not present

## 2018-08-05 DIAGNOSIS — J3089 Other allergic rhinitis: Secondary | ICD-10-CM | POA: Diagnosis not present

## 2018-08-05 DIAGNOSIS — J301 Allergic rhinitis due to pollen: Secondary | ICD-10-CM | POA: Diagnosis not present

## 2018-08-06 ENCOUNTER — Encounter: Payer: Medicare Other | Admitting: Physical Therapy

## 2018-08-08 ENCOUNTER — Encounter: Payer: Medicare Other | Admitting: Physical Therapy

## 2018-08-12 DIAGNOSIS — J3089 Other allergic rhinitis: Secondary | ICD-10-CM | POA: Diagnosis not present

## 2018-08-12 DIAGNOSIS — J301 Allergic rhinitis due to pollen: Secondary | ICD-10-CM | POA: Diagnosis not present

## 2018-08-12 DIAGNOSIS — J3081 Allergic rhinitis due to animal (cat) (dog) hair and dander: Secondary | ICD-10-CM | POA: Diagnosis not present

## 2018-08-13 ENCOUNTER — Encounter: Payer: Medicare Other | Admitting: Physical Therapy

## 2018-08-14 DIAGNOSIS — J3089 Other allergic rhinitis: Secondary | ICD-10-CM | POA: Diagnosis not present

## 2018-08-14 DIAGNOSIS — J301 Allergic rhinitis due to pollen: Secondary | ICD-10-CM | POA: Diagnosis not present

## 2018-08-14 DIAGNOSIS — J3081 Allergic rhinitis due to animal (cat) (dog) hair and dander: Secondary | ICD-10-CM | POA: Diagnosis not present

## 2018-08-15 ENCOUNTER — Encounter: Payer: Medicare Other | Admitting: Physical Therapy

## 2018-08-20 ENCOUNTER — Telehealth: Payer: Self-pay | Admitting: Internal Medicine

## 2018-08-20 DIAGNOSIS — J3081 Allergic rhinitis due to animal (cat) (dog) hair and dander: Secondary | ICD-10-CM | POA: Diagnosis not present

## 2018-08-20 DIAGNOSIS — J3089 Other allergic rhinitis: Secondary | ICD-10-CM | POA: Diagnosis not present

## 2018-08-20 DIAGNOSIS — J301 Allergic rhinitis due to pollen: Secondary | ICD-10-CM | POA: Diagnosis not present

## 2018-08-20 NOTE — Telephone Encounter (Signed)
Patient as dropped off a form to be signed by his PCP for the status report on his Diabetes.   Patient needs medication removed his medication list and the form signed.  Meds to remove: meloxicam (MOBIC) 15 MG tablet  ondansetron (ZOFRAN) 4 MG tablet  ranitidine (ZANTAC) 150 MG tablet   Form place in providers box.

## 2018-08-22 DIAGNOSIS — J3081 Allergic rhinitis due to animal (cat) (dog) hair and dander: Secondary | ICD-10-CM | POA: Diagnosis not present

## 2018-08-22 DIAGNOSIS — J301 Allergic rhinitis due to pollen: Secondary | ICD-10-CM | POA: Diagnosis not present

## 2018-08-22 DIAGNOSIS — J3089 Other allergic rhinitis: Secondary | ICD-10-CM | POA: Diagnosis not present

## 2018-08-22 NOTE — Telephone Encounter (Signed)
Form given to Dr. Alain Marion.

## 2018-08-26 DIAGNOSIS — J3089 Other allergic rhinitis: Secondary | ICD-10-CM | POA: Diagnosis not present

## 2018-08-26 DIAGNOSIS — J3081 Allergic rhinitis due to animal (cat) (dog) hair and dander: Secondary | ICD-10-CM | POA: Diagnosis not present

## 2018-08-26 DIAGNOSIS — J301 Allergic rhinitis due to pollen: Secondary | ICD-10-CM | POA: Diagnosis not present

## 2018-08-26 NOTE — Telephone Encounter (Signed)
Brian Ayers has this been completed?

## 2018-08-27 ENCOUNTER — Other Ambulatory Visit: Payer: Self-pay

## 2018-08-27 NOTE — Telephone Encounter (Signed)
Form has been signed & medication's have been removed. Copy sent to scan.   LVM to inform patient the form is ready to be picked up.

## 2018-08-28 DIAGNOSIS — J301 Allergic rhinitis due to pollen: Secondary | ICD-10-CM | POA: Diagnosis not present

## 2018-08-28 DIAGNOSIS — J3081 Allergic rhinitis due to animal (cat) (dog) hair and dander: Secondary | ICD-10-CM | POA: Diagnosis not present

## 2018-08-28 DIAGNOSIS — J3089 Other allergic rhinitis: Secondary | ICD-10-CM | POA: Diagnosis not present

## 2018-08-29 ENCOUNTER — Other Ambulatory Visit: Payer: Self-pay | Admitting: Internal Medicine

## 2018-09-02 DIAGNOSIS — J3089 Other allergic rhinitis: Secondary | ICD-10-CM | POA: Diagnosis not present

## 2018-09-02 DIAGNOSIS — J3081 Allergic rhinitis due to animal (cat) (dog) hair and dander: Secondary | ICD-10-CM | POA: Diagnosis not present

## 2018-09-02 DIAGNOSIS — J301 Allergic rhinitis due to pollen: Secondary | ICD-10-CM | POA: Diagnosis not present

## 2018-09-09 DIAGNOSIS — J301 Allergic rhinitis due to pollen: Secondary | ICD-10-CM | POA: Diagnosis not present

## 2018-09-09 DIAGNOSIS — J3089 Other allergic rhinitis: Secondary | ICD-10-CM | POA: Diagnosis not present

## 2018-09-09 DIAGNOSIS — J3081 Allergic rhinitis due to animal (cat) (dog) hair and dander: Secondary | ICD-10-CM | POA: Diagnosis not present

## 2018-09-16 DIAGNOSIS — J3081 Allergic rhinitis due to animal (cat) (dog) hair and dander: Secondary | ICD-10-CM | POA: Diagnosis not present

## 2018-09-16 DIAGNOSIS — J301 Allergic rhinitis due to pollen: Secondary | ICD-10-CM | POA: Diagnosis not present

## 2018-09-16 DIAGNOSIS — J3089 Other allergic rhinitis: Secondary | ICD-10-CM | POA: Diagnosis not present

## 2018-09-23 DIAGNOSIS — J301 Allergic rhinitis due to pollen: Secondary | ICD-10-CM | POA: Diagnosis not present

## 2018-09-23 DIAGNOSIS — J3081 Allergic rhinitis due to animal (cat) (dog) hair and dander: Secondary | ICD-10-CM | POA: Diagnosis not present

## 2018-09-23 DIAGNOSIS — J3089 Other allergic rhinitis: Secondary | ICD-10-CM | POA: Diagnosis not present

## 2018-09-26 ENCOUNTER — Other Ambulatory Visit: Payer: Self-pay | Admitting: Internal Medicine

## 2018-09-30 DIAGNOSIS — J3081 Allergic rhinitis due to animal (cat) (dog) hair and dander: Secondary | ICD-10-CM | POA: Diagnosis not present

## 2018-09-30 DIAGNOSIS — J301 Allergic rhinitis due to pollen: Secondary | ICD-10-CM | POA: Diagnosis not present

## 2018-09-30 DIAGNOSIS — J3089 Other allergic rhinitis: Secondary | ICD-10-CM | POA: Diagnosis not present

## 2018-10-07 DIAGNOSIS — J3081 Allergic rhinitis due to animal (cat) (dog) hair and dander: Secondary | ICD-10-CM | POA: Diagnosis not present

## 2018-10-07 DIAGNOSIS — J3089 Other allergic rhinitis: Secondary | ICD-10-CM | POA: Diagnosis not present

## 2018-10-07 DIAGNOSIS — J301 Allergic rhinitis due to pollen: Secondary | ICD-10-CM | POA: Diagnosis not present

## 2018-10-14 ENCOUNTER — Ambulatory Visit (INDEPENDENT_AMBULATORY_CARE_PROVIDER_SITE_OTHER): Payer: Medicare Other

## 2018-10-14 ENCOUNTER — Other Ambulatory Visit (INDEPENDENT_AMBULATORY_CARE_PROVIDER_SITE_OTHER): Payer: Medicare Other

## 2018-10-14 DIAGNOSIS — E119 Type 2 diabetes mellitus without complications: Secondary | ICD-10-CM

## 2018-10-14 DIAGNOSIS — Z23 Encounter for immunization: Secondary | ICD-10-CM

## 2018-10-14 DIAGNOSIS — J3089 Other allergic rhinitis: Secondary | ICD-10-CM | POA: Diagnosis not present

## 2018-10-14 DIAGNOSIS — J301 Allergic rhinitis due to pollen: Secondary | ICD-10-CM | POA: Diagnosis not present

## 2018-10-14 DIAGNOSIS — J3081 Allergic rhinitis due to animal (cat) (dog) hair and dander: Secondary | ICD-10-CM | POA: Diagnosis not present

## 2018-10-14 LAB — BASIC METABOLIC PANEL
BUN: 23 mg/dL (ref 6–23)
CALCIUM: 9.3 mg/dL (ref 8.4–10.5)
CHLORIDE: 103 meq/L (ref 96–112)
CO2: 25 mEq/L (ref 19–32)
Creatinine, Ser: 1.01 mg/dL (ref 0.40–1.50)
GFR: 78.7 mL/min (ref >60.00)
Glucose, Bld: 135 mg/dL — ABNORMAL HIGH (ref 70–99)
Potassium: 4 mEq/L (ref 3.5–5.1)
SODIUM: 138 meq/L (ref 135–145)

## 2018-10-14 LAB — HEMOGLOBIN A1C: HEMOGLOBIN A1C: 6.7 % — AB (ref 4.6–6.5)

## 2018-10-16 ENCOUNTER — Telehealth: Payer: Self-pay | Admitting: Internal Medicine

## 2018-10-16 NOTE — Telephone Encounter (Signed)
Copied from [place] [id number]. Topic: General - Inquiry >> Oct 16, 2018  5:18 PM Margot Ables wrote: Reason for CRM: pt wife calling (pt with her) to check on results for A1C stating they have not been released to mychart yet. Please advise.

## 2018-10-18 NOTE — Telephone Encounter (Signed)
Please advise on labs.

## 2018-10-18 NOTE — Progress Notes (Signed)
[facility] [address] [place],  [address] Phone: [phone] Subjective:   Brian Ayers, am serving as a scribe for Dr. Hulan Saas.   CC: Low back pain  UXL:[id number]  Brian Ayers is a 67 y.o. male coming in with complaint of low back pain.  Patient has been doing relatively well.  Still some mild discomfort.  Has responded fairly well to manipulation.      Past Medical History:  Diagnosis Date  . Alcohol abuse    hixtory of  . Allergy   . Asthma childhood  . Cancer ([place])    skin cancer on face X1  . Diabetes mellitus without complication ([place])   . GERD (gastroesophageal reflux disease)   . History of kidney stones   . Hx of colonic polyps   . Hyperlipidemia   . Rhinitis    on allery shots   Past Surgical History:  Procedure Laterality Date  . EXTRACORPOREAL SHOCK WAVE LITHOTRIPSY Right 03/14/2018   Procedure: RIGHT EXTRACORPOREAL SHOCK WAVE LITHOTRIPSY (ESWL);  Surgeon: Raynelle Bring, MD;  Location: [facility];  Service: Urology;  Laterality: Right;  . hernia with hydrocele  1973  . left shoulder pin setting  1978  . TONSILLECTOMY  1967  . WISDOM TOOTH EXTRACTION  2004   Social History   Socioeconomic History  . Marital status: Married    Spouse name: Not on file  . Number of children: 0  . Years of education: Not on file  . Highest education level: Not on file  Occupational History  . Occupation: Sport and exercise psychologist  Social Needs  . Financial resource strain: Not on file  . Food insecurity:    Worry: Not on file    Inability: Not on file  . Transportation needs:    Medical: Not on file    Non-medical: Not on file  Tobacco Use  . Smoking status: Former Smoker    Last attempt to quit: 12/18/1985    Years since quitting: 32.8  . Smokeless tobacco: Former Systems developer    Types: Snuff, Chew    Quit date: 12/18/1985  . Tobacco comment: only for a short period in the 1980's  Substance and Sexual Activity  . Alcohol  use: Ayers    Comment: history of ETOH abuse  . Drug use: Ayers  . Sexual activity: Never    Partners: Female    Comment: abstinent since 2006  Lifestyle  . Physical activity:    Days per week: Not on file    Minutes per session: Not on file  . Stress: Not on file  Relationships  . Social connections:    Talks on phone: Not on file    Gets together: Not on file    Attends religious service: Not on file    Active member of club or organization: Not on file    Attends meetings of clubs or organizations: Not on file    Relationship status: Not on file  Other Topics Concern  . Not on file  Social History Theme park manager. Married 1991- Ayers children. Marriage is in good health. 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      Regular exercise: daily/walk at gym   Caffeine use: 2 cups of coffee daily         Allergies  Allergen Reactions  . Pseudoephedrine     REACTION: tachycardia   Family History  Problem Relation Age of Onset  .  Cancer Mother        breast (hx of)  . Other Mother        trigeminal neuralgia s/p gamma knife/ CHF  . Hypertension Father   . Diabetes Neg Hx   . Colon cancer Neg Hx     Current Outpatient Medications (Endocrine & Metabolic):  .  metFORMIN (GLUCOPHAGE) 500 MG tablet, Take 500 mg by mouth See admin instructions. 1000 mg AM, 500 mg PM  Current Outpatient Medications (Cardiovascular):  Marland Kitchen  EPINEPHrine 0.3 mg/0.3 mL IJ SOAJ injection, as needed. .  rosuvastatin (CRESTOR) 10 MG tablet, Take 1 tablet (10 mg total) by mouth daily. .  sildenafil (VIAGRA) 100 MG tablet, Take 1 tablet (100 mg total) by mouth as needed for erectile dysfunction.  Current Outpatient Medications (Respiratory):  Marland Kitchen  ADVAIR DISKUS 100-50 MCG/DOSE AEPB, INHALE 1 PUFF INTO THE LUNGS TWO TIMES DAILY .  Azelastine-Fluticasone (DYMISTA) 137-50 MCG/ACT SUSP, 1 spr each nostril bid (Patient taking differently: 1 spr each nostril bid as needed) .  loratadine  (CLARITIN) 10 MG tablet, TAKE 1 TABLET (10 MG TOTAL) BY MOUTH DAILY. Marland Kitchen  PROAIR HFA 108 (90 BASE) MCG/ACT inhaler, as needed.  Current Outpatient Medications (Analgesics):  .  aspirin 81 MG chewable tablet, Chew 81 mg by mouth daily.   Current Outpatient Medications (Other):  .  Biotin 2500 MCG CAPS, Take 2,500 mcg by mouth daily.  .  clotrimazole-betamethasone (LOTRISONE) cream, APPLY 1 APPLICATION TOPICALLY 2 (TWO) TIMES DAILY. Marland Kitchen  ONETOUCH DELICA LANCETS FINE MISC, USE TO CHECK BS TWICE A DAY .  ONETOUCH VERIO test strip, USE TO CHECK BLOOD SUGAR TWICE A DAY DX E11.9 .  UNABLE TO FIND, Med Name: Takes allergy shots weekly    Past medical history, social, surgical and family history all reviewed in electronic medical record.  Ayers pertanent information unless stated regarding to the chief complaint.   Review of Systems:  Ayers headache, visual changes, nausea, vomiting, diarrhea, constipation, dizziness, abdominal pain, skin rash, fevers, chills, night sweats, weight loss, swollen lymph nodes, body aches, joint swelling, muscle aches, chest pain, shortness of breath, mood changes.   Objective  Height 5\' 10"  (1.778 m), weight 212 lb (96.2 kg).    General: Ayers apparent distress alert and oriented x3 mood and affect normal, dressed appropriately.  HEENT: Pupils equal, extraocular movements intact  Respiratory: Patient's speak in full sentences and does not appear short of breath  Cardiovascular: Ayers lower extremity edema, non tender, Ayers erythema  Skin: Warm dry intact with Ayers signs of infection or rash on extremities or on axial skeleton.  Abdomen: Soft nontender  Neuro: Cranial nerves II through XII are intact, neurovascularly intact in all extremities with 2+ DTRs and 2+ pulses.  Lymph: Ayers lymphadenopathy of posterior or anterior cervical chain or axillae bilaterally.  Gait normal with good balance and coordination.  MSK: Mild tender with full range of motion and good stability and  symmetric strength and tone of shoulders, elbows, wrist, hip, knee and ankles bilaterally.  Back exam shows some mild loss of lordosis.  Mild tenderness in the paraspinal musculature of the lumbar spine.  Mild pain over the right piriformis.  Negative straight leg test.  Osteopathic findings C7 flexed rotated and side bent left T6 extended rotated and side bent right inhaled rib L2 flexed rotated and side bent right Sacrum right on right    Impression and Recommendations:     This case required medical decision making of moderate complexity. The above  documentation has been reviewed and is accurate and complete Lyndal Pulley, DO       Note: This dictation was prepared with Dragon dictation along with smaller phrase technology. Any transcriptional errors that result from this process are unintentional.

## 2018-10-21 ENCOUNTER — Ambulatory Visit (INDEPENDENT_AMBULATORY_CARE_PROVIDER_SITE_OTHER): Payer: Medicare Other | Admitting: Internal Medicine

## 2018-10-21 ENCOUNTER — Ambulatory Visit (INDEPENDENT_AMBULATORY_CARE_PROVIDER_SITE_OTHER): Payer: Medicare Other | Admitting: Family Medicine

## 2018-10-21 ENCOUNTER — Encounter: Payer: Self-pay | Admitting: Internal Medicine

## 2018-10-21 DIAGNOSIS — J3089 Other allergic rhinitis: Secondary | ICD-10-CM | POA: Diagnosis not present

## 2018-10-21 DIAGNOSIS — M999 Biomechanical lesion, unspecified: Secondary | ICD-10-CM | POA: Insufficient documentation

## 2018-10-21 DIAGNOSIS — E785 Hyperlipidemia, unspecified: Secondary | ICD-10-CM

## 2018-10-21 DIAGNOSIS — J301 Allergic rhinitis due to pollen: Secondary | ICD-10-CM | POA: Diagnosis not present

## 2018-10-21 DIAGNOSIS — M5416 Radiculopathy, lumbar region: Secondary | ICD-10-CM | POA: Diagnosis not present

## 2018-10-21 DIAGNOSIS — N529 Male erectile dysfunction, unspecified: Secondary | ICD-10-CM | POA: Diagnosis not present

## 2018-10-21 DIAGNOSIS — E119 Type 2 diabetes mellitus without complications: Secondary | ICD-10-CM

## 2018-10-21 DIAGNOSIS — J3081 Allergic rhinitis due to animal (cat) (dog) hair and dander: Secondary | ICD-10-CM | POA: Diagnosis not present

## 2018-10-21 NOTE — Assessment & Plan Note (Signed)
Viagra

## 2018-10-21 NOTE — Assessment & Plan Note (Signed)
Decision today to treat with OMT was based on Physical Exam  After verbal consent patient was treated with HVLA, ME, FPR techniques in cervical, thoracic, rib lumbar and sacral areas  Patient tolerated the procedure well with improvement in symptoms  Patient given exercises, stretches and lifestyle modifications  See medications in patient instructions if given  Patient will follow up in 4-8 weeks 

## 2018-10-21 NOTE — Assessment & Plan Note (Addendum)
CT card scoring Metformin

## 2018-10-21 NOTE — Progress Notes (Signed)
Subjective:  Patient ID: Brian Ayers, male    DOB: 03-21-1951  Age: 67 y.o. MRN: [id number]  CC: No chief complaint on file.   HPI Brian Ayers presents for DM, asthma, ED f/u. Brian Ayers has retired F/u LBP - better  Outpatient Medications Prior to Visit  Medication Sig Dispense Refill  . ADVAIR DISKUS 100-50 MCG/DOSE AEPB INHALE 1 PUFF INTO THE LUNGS TWO TIMES DAILY 180 each 3  . aspirin 81 MG chewable tablet Chew 81 mg by mouth daily.    . Azelastine-Fluticasone (DYMISTA) 137-50 MCG/ACT SUSP 1 spr each nostril bid (Patient taking differently: 1 spr each nostril bid as needed) 3 Bottle 3  . Biotin 2500 MCG CAPS Take 2,500 mcg by mouth daily.     . clotrimazole-betamethasone (LOTRISONE) cream APPLY 1 APPLICATION TOPICALLY 2 (TWO) TIMES DAILY. 45 g 1  . EPINEPHrine 0.3 mg/0.3 mL IJ SOAJ injection as needed.  0  . loratadine (CLARITIN) 10 MG tablet TAKE 1 TABLET (10 MG TOTAL) BY MOUTH DAILY. 100 tablet 3  . metFORMIN (GLUCOPHAGE) 500 MG tablet Take 500 mg by mouth See admin instructions. 1000 mg AM, 500 mg PM    . ONETOUCH DELICA LANCETS FINE MISC USE TO CHECK BS TWICE A DAY 100 each 5  . ONETOUCH VERIO test strip USE TO CHECK BLOOD SUGAR TWICE A DAY DX E11.9 300 each 1  . PROAIR HFA 108 (90 BASE) MCG/ACT inhaler as needed.  0  . rosuvastatin (CRESTOR) 10 MG tablet Take 1 tablet (10 mg total) by mouth daily. 90 tablet 3  . sildenafil (VIAGRA) 100 MG tablet Take 1 tablet (100 mg total) by mouth as needed for erectile dysfunction. 12 tablet 11  . UNABLE TO FIND Med Name: Takes allergy shots weekly    . docusate sodium (COLACE) 250 MG capsule Take 1 capsule (250 mg total) by mouth daily. 10 capsule 0  . glucose blood (ONE TOUCH ULTRA TEST) test strip Use to check blood sugars twice a day 100 each 5  . metFORMIN (GLUCOPHAGE) 500 MG tablet TAKE 1 TABLET (500 MG TOTAL) BY MOUTH 2 (TWO) TIMES DAILY WITH A MEAL. (Patient taking differently: Take 500 mg by mouth. 2 in the morning and 1 at night)  180 tablet 3  . ONETOUCH VERIO test strip USE TO CHECK BLOOD SUGAR TWICE A DAY DX E11.9 100 each 3   No facility-administered medications prior to visit.     ROS: Review of Systems  Constitutional: Negative for appetite change, fatigue and unexpected weight change.  HENT: Negative for congestion, nosebleeds, sneezing, sore throat and trouble swallowing.   Eyes: Negative for itching and visual disturbance.  Respiratory: Negative for cough.   Cardiovascular: Negative for chest pain, palpitations and leg swelling.  Gastrointestinal: Negative for abdominal distention, blood in stool, diarrhea and nausea.  Genitourinary: Negative for frequency and hematuria.  Musculoskeletal: Positive for back pain. Negative for gait problem, joint swelling and neck pain.  Skin: Negative for rash.  Neurological: Negative for dizziness, tremors, speech difficulty and weakness.  Psychiatric/Behavioral: Negative for agitation, dysphoric mood, sleep disturbance and suicidal ideas. The patient is not nervous/anxious.     Objective:  BP (!) 148/86 (BP Location: Left Arm, Patient Position: Sitting, Cuff Size: Large)   Pulse (!) 58   Temp 98.4 F (36.9 C) (Oral)   Ht 5\' 10"  (1.778 m)   Wt 212 lb (96.2 kg)   SpO2 98%   BMI 30.42 kg/m   BP Readings from Last 3  Encounters:  10/21/18 (!) 148/86  08/01/18 (!) 162/90  07/18/18 (!) 160/92    Wt Readings from Last 3 Encounters:  10/21/18 212 lb (96.2 kg)  08/01/18 204 lb (92.5 kg)  07/18/18 207 lb (93.9 kg)    Physical Exam  Constitutional: He is oriented to person, place, and time. He appears well-developed. No distress.  NAD  HENT:  Mouth/Throat: Oropharynx is clear and moist.  Eyes: Pupils are equal, round, and reactive to light. Conjunctivae are normal.  Neck: Normal range of motion. No JVD present. No thyromegaly present.  Cardiovascular: Normal rate, regular rhythm, normal heart sounds and intact distal pulses. Exam reveals no gallop and no  friction rub.  No murmur heard. Pulmonary/Chest: Effort normal and breath sounds normal. No respiratory distress. He has no wheezes. He has no rales. He exhibits no tenderness.  Abdominal: Soft. Bowel sounds are normal. He exhibits no distension and no mass. There is no tenderness. There is no rebound and no guarding.  Musculoskeletal: Normal range of motion. He exhibits no edema or tenderness.  Lymphadenopathy:    He has no cervical adenopathy.  Neurological: He is alert and oriented to person, place, and time. He has normal reflexes. No cranial nerve deficit. He exhibits normal muscle tone. He displays a negative Romberg sign. Coordination and gait normal.  Skin: Skin is warm and dry. No rash noted.  Psychiatric: He has a normal mood and affect. His behavior is normal. Judgment and thought content normal.  LS tender  Lab Results  Component Value Date   WBC 6.5 07/01/2018   HGB 14.8 07/01/2018   HCT 42.1 07/01/2018   PLT 225.0 07/01/2018   GLUCOSE 135 (H) 10/14/2018   CHOL 130 03/25/2018   TRIG 75.0 03/25/2018   HDL 44.60 03/25/2018   LDLCALC 71 03/25/2018   ALT 18 07/01/2018   AST 17 07/01/2018   NA 138 10/14/2018   K 4.0 10/14/2018   CL 103 10/14/2018   CREATININE 1.01 10/14/2018   BUN 23 10/14/2018   CO2 25 10/14/2018   TSH 4.22 03/25/2018   PSA 0.90 03/25/2018   HGBA1C 6.7 (H) 10/14/2018   MICROALBUR <0.7 03/25/2018    Dg Lumbar Spine Complete  Result Date: 07/02/2018 CLINICAL DATA:  Sharp twist in back 6 days ago with severe left-sided back pain and left leg pain, some numbness EXAM: LUMBAR SPINE - COMPLETE 4+ VIEW COMPARISON:  None. FINDINGS: The lumbar vertebrae are in normal alignment. Mild anterior degenerative spurs are noted from T11-L4. Only the T11-T12 disc space is slightly narrowed. No compression deformity is seen. The SI joints appear corticated. IMPRESSION: 1. Normal alignment with only mild degenerative change. Slight narrowing of T11-T12 disc space. 2. No  acute compression deformity. Electronically Signed   By: Brian Ayers M.D.   On: 07/02/2018 10:14    Assessment & Plan:   There are no diagnoses linked to this encounter.   No orders of the defined types were placed in this encounter.    Follow-up: No follow-ups on file.  Walker Kehr, MD

## 2018-10-21 NOTE — Assessment & Plan Note (Signed)
Doing better.   

## 2018-10-21 NOTE — Patient Instructions (Signed)
Good to see you  Brian Ayers is your friend  You are responding well to manipulation  See me again in 6-8 weeks

## 2018-10-21 NOTE — Patient Instructions (Signed)

## 2018-10-21 NOTE — Assessment & Plan Note (Signed)
Stable at the moment.  No radicular symptoms.  Seems to be making improvement.  Discussed icing regimen and home exercise.  Discussed which activities to do which wants to avoid.  Patient is to increase activity slowly over the course the next several days.  Follow-up with me again in 4 to 8 weeks

## 2018-10-21 NOTE — Telephone Encounter (Signed)
Discussed today Thx

## 2018-10-21 NOTE — Assessment & Plan Note (Signed)
Medication: Crestor 10 mg 

## 2018-10-28 DIAGNOSIS — J3081 Allergic rhinitis due to animal (cat) (dog) hair and dander: Secondary | ICD-10-CM | POA: Diagnosis not present

## 2018-10-28 DIAGNOSIS — J3089 Other allergic rhinitis: Secondary | ICD-10-CM | POA: Diagnosis not present

## 2018-10-28 DIAGNOSIS — J301 Allergic rhinitis due to pollen: Secondary | ICD-10-CM | POA: Diagnosis not present

## 2018-11-04 DIAGNOSIS — J301 Allergic rhinitis due to pollen: Secondary | ICD-10-CM | POA: Diagnosis not present

## 2018-11-04 DIAGNOSIS — J3089 Other allergic rhinitis: Secondary | ICD-10-CM | POA: Diagnosis not present

## 2018-11-04 DIAGNOSIS — J3081 Allergic rhinitis due to animal (cat) (dog) hair and dander: Secondary | ICD-10-CM | POA: Diagnosis not present

## 2018-11-07 ENCOUNTER — Ambulatory Visit
Admission: RE | Admit: 2018-11-07 | Discharge: 2018-11-07 | Disposition: A | Discharge status: Home / Self Care | Payer: Self-pay | Source: Ambulatory Visit | Attending: Internal Medicine | Admitting: Internal Medicine

## 2018-11-07 DIAGNOSIS — E785 Hyperlipidemia, unspecified: Secondary | ICD-10-CM

## 2018-11-07 DIAGNOSIS — E119 Type 2 diabetes mellitus without complications: Secondary | ICD-10-CM

## 2018-11-11 DIAGNOSIS — J301 Allergic rhinitis due to pollen: Secondary | ICD-10-CM | POA: Diagnosis not present

## 2018-11-11 DIAGNOSIS — J3081 Allergic rhinitis due to animal (cat) (dog) hair and dander: Secondary | ICD-10-CM | POA: Diagnosis not present

## 2018-11-11 DIAGNOSIS — J3089 Other allergic rhinitis: Secondary | ICD-10-CM | POA: Diagnosis not present

## 2018-11-18 DIAGNOSIS — J3089 Other allergic rhinitis: Secondary | ICD-10-CM | POA: Diagnosis not present

## 2018-11-18 DIAGNOSIS — J301 Allergic rhinitis due to pollen: Secondary | ICD-10-CM | POA: Diagnosis not present

## 2018-11-18 DIAGNOSIS — J3081 Allergic rhinitis due to animal (cat) (dog) hair and dander: Secondary | ICD-10-CM | POA: Diagnosis not present

## 2018-11-21 IMAGING — CR DG ABDOMEN 1V
1 series · 1 of 1 positions shown · non-contrast
Comparison: KUB 03/14/2018 and 03/01/2018. CT abdomen and pelvis
02/28/2018.

CLINICAL DATA: Patient status post lithotripsy for a right ureteral
stone 2 weeks ago. Continued abdominal pain.

EXAM:
ABDOMEN - 1 VIEW

[t abdomen supine]
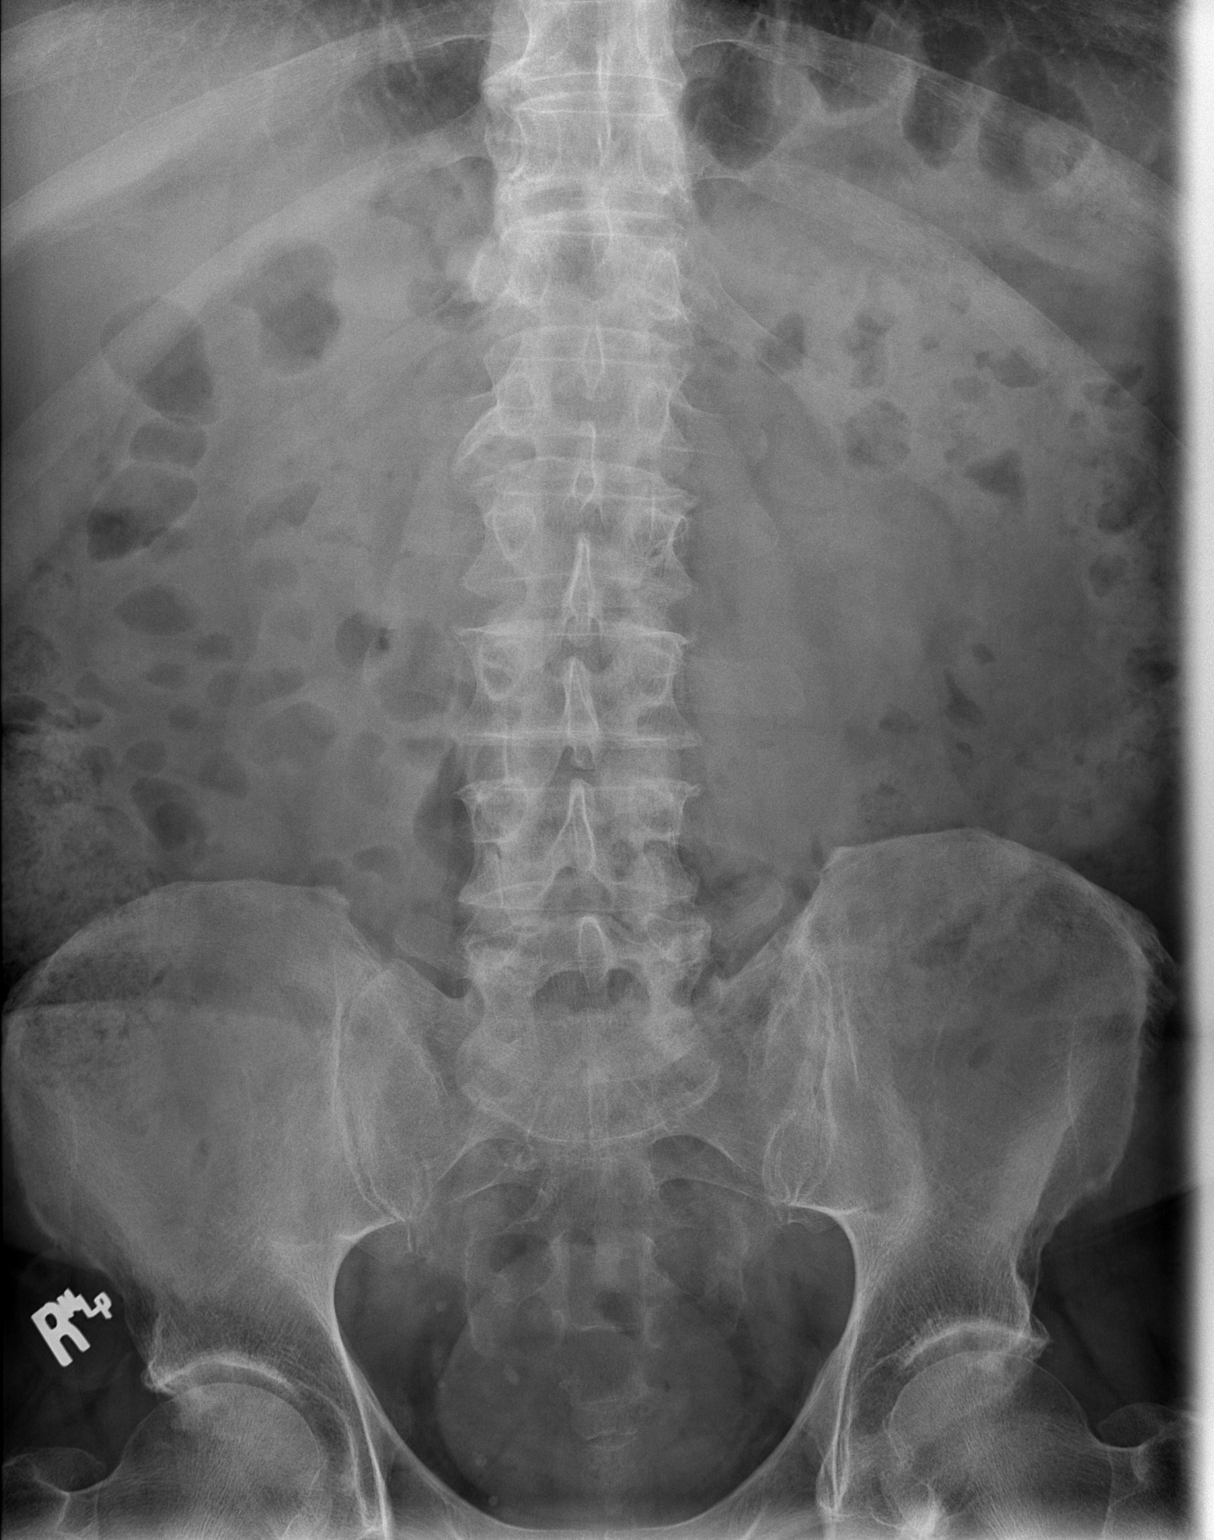

[1 of 1 positions shown; findings below may reference images not displayed]

FINDINGS: A new calcification in the right pelvis measuring 0.6 cm in diameter
is at the expected location of the right ureterovesical junction.
Four phleboliths in the right pelvis are unchanged. No evidence of
urinary tract stone. The bowel gas pattern is normal.
IMPRESSION: 0.6 cm calcification in the right pelvis is new since the prior
exams and compatible with a stone which could be either at the right
UVJ or in the urinary bladder. UVJ is favored.

## 2018-11-25 DIAGNOSIS — J3081 Allergic rhinitis due to animal (cat) (dog) hair and dander: Secondary | ICD-10-CM | POA: Diagnosis not present

## 2018-11-25 DIAGNOSIS — J301 Allergic rhinitis due to pollen: Secondary | ICD-10-CM | POA: Diagnosis not present

## 2018-11-25 DIAGNOSIS — J3089 Other allergic rhinitis: Secondary | ICD-10-CM | POA: Diagnosis not present

## 2018-12-02 DIAGNOSIS — J3089 Other allergic rhinitis: Secondary | ICD-10-CM | POA: Diagnosis not present

## 2018-12-02 DIAGNOSIS — J3081 Allergic rhinitis due to animal (cat) (dog) hair and dander: Secondary | ICD-10-CM | POA: Diagnosis not present

## 2018-12-02 DIAGNOSIS — J301 Allergic rhinitis due to pollen: Secondary | ICD-10-CM | POA: Diagnosis not present

## 2018-12-04 ENCOUNTER — Other Ambulatory Visit: Payer: Self-pay | Admitting: Internal Medicine

## 2018-12-06 DIAGNOSIS — J3081 Allergic rhinitis due to animal (cat) (dog) hair and dander: Secondary | ICD-10-CM | POA: Diagnosis not present

## 2018-12-06 DIAGNOSIS — J301 Allergic rhinitis due to pollen: Secondary | ICD-10-CM | POA: Diagnosis not present

## 2018-12-06 DIAGNOSIS — J3089 Other allergic rhinitis: Secondary | ICD-10-CM | POA: Diagnosis not present

## 2018-12-09 DIAGNOSIS — J3089 Other allergic rhinitis: Secondary | ICD-10-CM | POA: Diagnosis not present

## 2018-12-09 DIAGNOSIS — J301 Allergic rhinitis due to pollen: Secondary | ICD-10-CM | POA: Diagnosis not present

## 2018-12-09 DIAGNOSIS — J3081 Allergic rhinitis due to animal (cat) (dog) hair and dander: Secondary | ICD-10-CM | POA: Diagnosis not present

## 2018-12-16 DIAGNOSIS — J301 Allergic rhinitis due to pollen: Secondary | ICD-10-CM | POA: Diagnosis not present

## 2018-12-16 DIAGNOSIS — J3081 Allergic rhinitis due to animal (cat) (dog) hair and dander: Secondary | ICD-10-CM | POA: Diagnosis not present

## 2018-12-16 DIAGNOSIS — J3089 Other allergic rhinitis: Secondary | ICD-10-CM | POA: Diagnosis not present

## 2018-12-18 HISTORY — PX: CATARACT EXTRACTION: SUR2

## 2018-12-19 ENCOUNTER — Ambulatory Visit: Payer: Medicare Other | Admitting: Family Medicine

## 2018-12-23 DIAGNOSIS — J3081 Allergic rhinitis due to animal (cat) (dog) hair and dander: Secondary | ICD-10-CM | POA: Diagnosis not present

## 2018-12-23 DIAGNOSIS — J3089 Other allergic rhinitis: Secondary | ICD-10-CM | POA: Diagnosis not present

## 2018-12-23 DIAGNOSIS — J301 Allergic rhinitis due to pollen: Secondary | ICD-10-CM | POA: Diagnosis not present

## 2018-12-30 DIAGNOSIS — J3089 Other allergic rhinitis: Secondary | ICD-10-CM | POA: Diagnosis not present

## 2018-12-30 DIAGNOSIS — J3081 Allergic rhinitis due to animal (cat) (dog) hair and dander: Secondary | ICD-10-CM | POA: Diagnosis not present

## 2018-12-30 DIAGNOSIS — J301 Allergic rhinitis due to pollen: Secondary | ICD-10-CM | POA: Diagnosis not present

## 2019-01-01 DIAGNOSIS — J3081 Allergic rhinitis due to animal (cat) (dog) hair and dander: Secondary | ICD-10-CM | POA: Diagnosis not present

## 2019-01-01 DIAGNOSIS — J3089 Other allergic rhinitis: Secondary | ICD-10-CM | POA: Diagnosis not present

## 2019-01-01 DIAGNOSIS — J301 Allergic rhinitis due to pollen: Secondary | ICD-10-CM | POA: Diagnosis not present

## 2019-01-06 DIAGNOSIS — J301 Allergic rhinitis due to pollen: Secondary | ICD-10-CM | POA: Diagnosis not present

## 2019-01-06 DIAGNOSIS — J3081 Allergic rhinitis due to animal (cat) (dog) hair and dander: Secondary | ICD-10-CM | POA: Diagnosis not present

## 2019-01-06 DIAGNOSIS — J3089 Other allergic rhinitis: Secondary | ICD-10-CM | POA: Diagnosis not present

## 2019-01-07 ENCOUNTER — Other Ambulatory Visit: Payer: Self-pay | Admitting: Internal Medicine

## 2019-01-08 DIAGNOSIS — J3089 Other allergic rhinitis: Secondary | ICD-10-CM | POA: Diagnosis not present

## 2019-01-08 DIAGNOSIS — J3081 Allergic rhinitis due to animal (cat) (dog) hair and dander: Secondary | ICD-10-CM | POA: Diagnosis not present

## 2019-01-08 DIAGNOSIS — J301 Allergic rhinitis due to pollen: Secondary | ICD-10-CM | POA: Diagnosis not present

## 2019-01-10 ENCOUNTER — Telehealth: Payer: Self-pay | Admitting: Internal Medicine

## 2019-01-10 MED ORDER — RANITIDINE HCL 150 MG PO TABS: 150.0000 mg | ORAL_TABLET | Freq: Two times a day (BID) | ORAL | 3 refills | Status: DC

## 2019-01-10 NOTE — Telephone Encounter (Signed)
RX sent per pts request

## 2019-01-10 NOTE — Telephone Encounter (Signed)
LM notifying med was taken off med lise because he was no longer taking it and to call back to clarify

## 2019-01-10 NOTE — Telephone Encounter (Signed)
Copied from [facility]. Topic: General - Other >> Jan 10, 2019  8:47 AM Lennox Solders wrote: Reason for CRM: pt is calling would like to know why the ranitidine was denied

## 2019-01-13 DIAGNOSIS — J301 Allergic rhinitis due to pollen: Secondary | ICD-10-CM | POA: Diagnosis not present

## 2019-01-13 DIAGNOSIS — J3089 Other allergic rhinitis: Secondary | ICD-10-CM | POA: Diagnosis not present

## 2019-01-13 DIAGNOSIS — J3081 Allergic rhinitis due to animal (cat) (dog) hair and dander: Secondary | ICD-10-CM | POA: Diagnosis not present

## 2019-01-15 DIAGNOSIS — J301 Allergic rhinitis due to pollen: Secondary | ICD-10-CM | POA: Diagnosis not present

## 2019-01-15 DIAGNOSIS — J3081 Allergic rhinitis due to animal (cat) (dog) hair and dander: Secondary | ICD-10-CM | POA: Diagnosis not present

## 2019-01-15 DIAGNOSIS — J3089 Other allergic rhinitis: Secondary | ICD-10-CM | POA: Diagnosis not present

## 2019-01-20 DIAGNOSIS — J3089 Other allergic rhinitis: Secondary | ICD-10-CM | POA: Diagnosis not present

## 2019-01-20 DIAGNOSIS — J301 Allergic rhinitis due to pollen: Secondary | ICD-10-CM | POA: Diagnosis not present

## 2019-01-20 DIAGNOSIS — J3081 Allergic rhinitis due to animal (cat) (dog) hair and dander: Secondary | ICD-10-CM | POA: Diagnosis not present

## 2019-01-24 DIAGNOSIS — D225 Melanocytic nevi of trunk: Secondary | ICD-10-CM | POA: Diagnosis not present

## 2019-01-24 DIAGNOSIS — L570 Actinic keratosis: Secondary | ICD-10-CM | POA: Diagnosis not present

## 2019-01-24 DIAGNOSIS — L814 Other melanin hyperpigmentation: Secondary | ICD-10-CM | POA: Diagnosis not present

## 2019-01-24 DIAGNOSIS — L821 Other seborrheic keratosis: Secondary | ICD-10-CM | POA: Diagnosis not present

## 2019-01-24 DIAGNOSIS — D485 Neoplasm of uncertain behavior of skin: Secondary | ICD-10-CM | POA: Diagnosis not present

## 2019-01-24 DIAGNOSIS — Z85828 Personal history of other malignant neoplasm of skin: Secondary | ICD-10-CM | POA: Diagnosis not present

## 2019-01-24 DIAGNOSIS — L820 Inflamed seborrheic keratosis: Secondary | ICD-10-CM | POA: Diagnosis not present

## 2019-01-24 DIAGNOSIS — C44622 Squamous cell carcinoma of skin of right upper limb, including shoulder: Secondary | ICD-10-CM | POA: Diagnosis not present

## 2019-01-24 DIAGNOSIS — D2272 Melanocytic nevi of left lower limb, including hip: Secondary | ICD-10-CM | POA: Diagnosis not present

## 2019-01-24 DIAGNOSIS — Z23 Encounter for immunization: Secondary | ICD-10-CM | POA: Diagnosis not present

## 2019-01-27 DIAGNOSIS — J301 Allergic rhinitis due to pollen: Secondary | ICD-10-CM | POA: Diagnosis not present

## 2019-01-27 DIAGNOSIS — J3081 Allergic rhinitis due to animal (cat) (dog) hair and dander: Secondary | ICD-10-CM | POA: Diagnosis not present

## 2019-01-27 DIAGNOSIS — J3089 Other allergic rhinitis: Secondary | ICD-10-CM | POA: Diagnosis not present

## 2019-02-03 ENCOUNTER — Encounter: Payer: Self-pay | Admitting: Internal Medicine

## 2019-02-03 DIAGNOSIS — J3089 Other allergic rhinitis: Secondary | ICD-10-CM | POA: Diagnosis not present

## 2019-02-03 DIAGNOSIS — J3081 Allergic rhinitis due to animal (cat) (dog) hair and dander: Secondary | ICD-10-CM | POA: Diagnosis not present

## 2019-02-03 DIAGNOSIS — J301 Allergic rhinitis due to pollen: Secondary | ICD-10-CM | POA: Diagnosis not present

## 2019-02-10 DIAGNOSIS — J301 Allergic rhinitis due to pollen: Secondary | ICD-10-CM | POA: Diagnosis not present

## 2019-02-10 DIAGNOSIS — J3081 Allergic rhinitis due to animal (cat) (dog) hair and dander: Secondary | ICD-10-CM | POA: Diagnosis not present

## 2019-02-10 DIAGNOSIS — J3089 Other allergic rhinitis: Secondary | ICD-10-CM | POA: Diagnosis not present

## 2019-02-17 DIAGNOSIS — J301 Allergic rhinitis due to pollen: Secondary | ICD-10-CM | POA: Diagnosis not present

## 2019-02-17 DIAGNOSIS — J3081 Allergic rhinitis due to animal (cat) (dog) hair and dander: Secondary | ICD-10-CM | POA: Diagnosis not present

## 2019-02-17 DIAGNOSIS — J3089 Other allergic rhinitis: Secondary | ICD-10-CM | POA: Diagnosis not present

## 2019-02-18 ENCOUNTER — Ambulatory Visit (AMBULATORY_SURGERY_CENTER): Payer: Self-pay

## 2019-02-18 VITALS — Ht 70.0 in | Wt 211.0 lb

## 2019-02-18 DIAGNOSIS — Z86010 Personal history of colonic polyps: Secondary | ICD-10-CM

## 2019-02-18 MED ORDER — NA SULFATE-K SULFATE-MG SULF 17.5-3.13-1.6 GM/177ML PO SOLN: 1.0000 | Freq: Once | ORAL | 0 refills | Status: AC

## 2019-02-18 NOTE — Progress Notes (Signed)
Denies allergies to eggs or soy products. Denies complication of anesthesia or sedation. Denies use of weight loss medication. Denies use of O2.   Emmi instructions declined.   A pay no more than 50.00 coupon for Suprep was given to the patient.  

## 2019-02-20 ENCOUNTER — Ambulatory Visit: Payer: Medicare Other | Admitting: Internal Medicine

## 2019-02-20 ENCOUNTER — Other Ambulatory Visit (INDEPENDENT_AMBULATORY_CARE_PROVIDER_SITE_OTHER): Payer: Medicare Other

## 2019-02-20 DIAGNOSIS — E119 Type 2 diabetes mellitus without complications: Secondary | ICD-10-CM | POA: Diagnosis not present

## 2019-02-20 LAB — BASIC METABOLIC PANEL
BUN: 20 mg/dL (ref 6–23)
CHLORIDE: 102 meq/L (ref 96–112)
CO2: 28 mEq/L (ref 19–32)
Calcium: 9.2 mg/dL (ref 8.4–10.5)
Creatinine, Ser: 1.03 mg/dL (ref 0.40–1.50)
GFR: 72.31 mL/min (ref >60.00)
Glucose, Bld: 141 mg/dL — ABNORMAL HIGH (ref 70–99)
Potassium: 4.3 mEq/L (ref 3.5–5.1)
Sodium: 137 mEq/L (ref 135–145)

## 2019-02-20 LAB — HEMOGLOBIN A1C: Hgb A1c MFr Bld: 6.9 % — ABNORMAL HIGH (ref 4.6–6.5)

## 2019-02-24 DIAGNOSIS — J3081 Allergic rhinitis due to animal (cat) (dog) hair and dander: Secondary | ICD-10-CM | POA: Diagnosis not present

## 2019-02-24 DIAGNOSIS — J301 Allergic rhinitis due to pollen: Secondary | ICD-10-CM | POA: Diagnosis not present

## 2019-02-24 DIAGNOSIS — J3089 Other allergic rhinitis: Secondary | ICD-10-CM | POA: Diagnosis not present

## 2019-02-27 ENCOUNTER — Encounter: Payer: Self-pay | Admitting: Internal Medicine

## 2019-02-27 ENCOUNTER — Ambulatory Visit (INDEPENDENT_AMBULATORY_CARE_PROVIDER_SITE_OTHER): Payer: Medicare Other | Admitting: Internal Medicine

## 2019-02-27 ENCOUNTER — Other Ambulatory Visit: Payer: Self-pay

## 2019-02-27 DIAGNOSIS — E119 Type 2 diabetes mellitus without complications: Secondary | ICD-10-CM

## 2019-02-27 MED ORDER — ROSUVASTATIN CALCIUM 10 MG PO TABS: 10.0000 mg | ORAL_TABLET | Freq: Every day | ORAL | 3 refills | Status: DC

## 2019-02-27 MED ORDER — FAMOTIDINE 20 MG PO TABS: 20.0000 mg | ORAL_TABLET | Freq: Two times a day (BID) | ORAL | 3 refills | Status: DC

## 2019-02-27 MED ORDER — METFORMIN HCL 500 MG PO TABS: ORAL_TABLET | ORAL | 3 refills | Status: DC

## 2019-02-27 NOTE — Progress Notes (Signed)
Subjective:  Patient ID: Brian Ayers, male    DOB: 05/08/1951  Age: 68 y.o. MRN: [id number]  CC: No chief complaint on file.   HPI ERNAN RUNKLES presents for DM, HTN, dyslipidemia f/u  Outpatient Medications Prior to Visit  Medication Sig Dispense Refill  . ADVAIR DISKUS 100-50 MCG/DOSE AEPB INHALE 1 PUFF INTO THE LUNGS TWO TIMES DAILY 180 each 3  . aspirin 81 MG chewable tablet Chew 81 mg by mouth daily.    . Azelastine-Fluticasone (DYMISTA) 137-50 MCG/ACT SUSP 1 spr each nostril bid (Patient taking differently: 1 spr each nostril bid as needed) 3 Bottle 3  . Biotin 2500 MCG CAPS Take 2,500 mcg by mouth daily.     Marland Kitchen EPINEPHrine 0.3 mg/0.3 mL IJ SOAJ injection as needed.  0  . loratadine (CLARITIN) 10 MG tablet TAKE 1 TABLET (10 MG TOTAL) BY MOUTH DAILY. 100 tablet 3  . metFORMIN (GLUCOPHAGE) 500 MG tablet Take 500 mg by mouth See admin instructions. 1000 mg AM, 500 mg PM    . ONETOUCH DELICA LANCETS 19E MISC USE TO CHECK BS TWICE A DAY 100 each 5  . ONETOUCH VERIO test strip USE TO CHECK BLOOD SUGAR TWICE A DAY DX E11.9 300 each 1  . OVER THE COUNTER MEDICATION Saw Palmetto 450 mg, twice daily.    Marland Kitchen OVER THE COUNTER MEDICATION Vitamin D 3 One capsule daily.    Marland Kitchen PROAIR HFA 108 (90 BASE) MCG/ACT inhaler as needed.  0  . ranitidine (ZANTAC) 150 MG tablet Take 1 tablet (150 mg total) by mouth 2 (two) times daily. 180 tablet 3  . rosuvastatin (CRESTOR) 10 MG tablet Take 1 tablet (10 mg total) by mouth daily. 90 tablet 3  . sildenafil (VIAGRA) 100 MG tablet Take 1 tablet (100 mg total) by mouth as needed for erectile dysfunction. 12 tablet 11  . UNABLE TO FIND Med Name: Takes allergy shots weekly     No facility-administered medications prior to visit.     ROS: Review of Systems  Constitutional: Negative for appetite change, fatigue and unexpected weight change.  HENT: Negative for congestion, nosebleeds, sneezing, sore throat and trouble swallowing.   Eyes: Negative for  itching and visual disturbance.  Respiratory: Negative for cough.   Cardiovascular: Negative for chest pain, palpitations and leg swelling.  Gastrointestinal: Negative for abdominal distention, blood in stool, diarrhea and nausea.  Genitourinary: Negative for frequency and hematuria.  Musculoskeletal: Negative for back pain, gait problem, joint swelling and neck pain.  Skin: Negative for rash.  Neurological: Negative for dizziness, tremors, speech difficulty and weakness.  Psychiatric/Behavioral: Negative for agitation, dysphoric mood, sleep disturbance and suicidal ideas. The patient is not nervous/anxious.     Objective:  BP (!) 144/82 (BP Location: Left Arm, Patient Position: Sitting, Cuff Size: Normal)   Pulse 83   Temp 98.1 F (36.7 C) (Oral)   Ht 5\' 10"  (1.778 m)   Wt 207 lb (93.9 kg)   SpO2 97%   BMI 29.70 kg/m   BP Readings from Last 3 Encounters:  02/27/19 (!) 144/82  10/21/18 (!) 148/86  08/01/18 (!) 162/90    Wt Readings from Last 3 Encounters:  02/27/19 207 lb (93.9 kg)  02/18/19 211 lb (95.7 kg)  10/21/18 212 lb (96.2 kg)    Physical Exam Constitutional:      General: He is not in acute distress.    Appearance: He is well-developed.     Comments: NAD  Eyes:  Conjunctiva/sclera: Conjunctivae normal.     Pupils: Pupils are equal, round, and reactive to light.  Neck:     Musculoskeletal: Normal range of motion.     Thyroid: No thyromegaly.     Vascular: No JVD.  Cardiovascular:     Rate and Rhythm: Normal rate and regular rhythm.     Heart sounds: Normal heart sounds. No murmur. No friction rub. No gallop.   Pulmonary:     Effort: Pulmonary effort is normal. No respiratory distress.     Breath sounds: Normal breath sounds. No wheezing or rales.  Chest:     Chest wall: No tenderness.  Abdominal:     General: Bowel sounds are normal. There is no distension.     Palpations: Abdomen is soft. There is no mass.     Tenderness: There is no abdominal  tenderness. There is no guarding or rebound.  Musculoskeletal: Normal range of motion.        General: No tenderness.  Lymphadenopathy:     Cervical: No cervical adenopathy.  Skin:    General: Skin is warm and dry.     Findings: No rash.  Neurological:     Mental Status: He is alert and oriented to person, place, and time.     Cranial Nerves: No cranial nerve deficit.     Motor: No abnormal muscle tone.     Coordination: Coordination normal.     Gait: Gait normal.     Deep Tendon Reflexes: Reflexes are normal and symmetric.  Psychiatric:        Behavior: Behavior normal.        Thought Content: Thought content normal.        Judgment: Judgment normal.     Lab Results  Component Value Date   WBC 6.5 07/01/2018   HGB 14.8 07/01/2018   HCT 42.1 07/01/2018   PLT 225.0 07/01/2018   GLUCOSE 141 (H) 02/20/2019   CHOL 130 03/25/2018   TRIG 75.0 03/25/2018   HDL 44.60 03/25/2018   LDLCALC 71 03/25/2018   ALT 18 07/01/2018   AST 17 07/01/2018   NA 137 02/20/2019   K 4.3 02/20/2019   CL 102 02/20/2019   CREATININE 1.03 02/20/2019   BUN 20 02/20/2019   CO2 28 02/20/2019   TSH 4.22 03/25/2018   PSA 0.90 03/25/2018   HGBA1C 6.9 (H) 02/20/2019   MICROALBUR <0.7 03/25/2018    Ct Cardiac Scoring  Addendum Date: 11/07/2018   ADDENDUM REPORT: 11/07/2018 17:47 CLINICAL DATA:  Risk stratification EXAM: Coronary Calcium Score TECHNIQUE: The patient was scanned on a [employer] scanner. Axial non-contrast 3 mm slices were carried out through the heart. The data set was analyzed on a dedicated work station and scored using the [place]. FINDINGS: Non-cardiac: See separate report from [facility]. Ascending Aorta: Normal size, mild diffuse calcifications in the aortic arch and descending aorta. Pericardium: Normal. Coronary arteries: Normal origin. IMPRESSION: Coronary calcium score of 76. This was 26 percentile for age and sex matched control. Electronically Signed   By:  Ena Dawley   On: 11/07/2018 17:47   Result Date: 11/07/2018 EXAM: OVER-READ INTERPRETATION  CT CHEST The following report is an over-read performed by radiologist Dr. Rolm Baptise of [facility] on 11/07/2018. This over-read does not include interpretation of cardiac or coronary anatomy or pathology. The coronary calcium score interpretation by the cardiologist is attached. COMPARISON:  None FINDINGS: Vascular: Heart is normal size.  Aorta is normal caliber. Mediastinum/Nodes: No adenopathy  in the visualized lower mediastinum or hila. Lungs/Pleura: Visualized lungs clear.  No effusions. Upper Abdomen: Imaging into the upper abdomen shows no acute findings. Musculoskeletal: Chest wall soft tissues are unremarkable. No acute bony abnormality. IMPRESSION: No acute or significant extracardiac abnormality. Electronically Signed: By: Rolm Baptise M.D. On: 11/07/2018 16:51    Assessment & Plan:   There are no diagnoses linked to this encounter.   No orders of the defined types were placed in this encounter.    Follow-up: No follow-ups on file.  Walker Kehr, MD

## 2019-02-27 NOTE — Assessment & Plan Note (Signed)
Metformin 

## 2019-03-03 ENCOUNTER — Telehealth: Payer: Self-pay | Admitting: *Deleted

## 2019-03-03 ENCOUNTER — Telehealth: Payer: Self-pay | Admitting: Internal Medicine

## 2019-03-03 DIAGNOSIS — J301 Allergic rhinitis due to pollen: Secondary | ICD-10-CM | POA: Diagnosis not present

## 2019-03-03 DIAGNOSIS — J3081 Allergic rhinitis due to animal (cat) (dog) hair and dander: Secondary | ICD-10-CM | POA: Diagnosis not present

## 2019-03-03 DIAGNOSIS — J3089 Other allergic rhinitis: Secondary | ICD-10-CM | POA: Diagnosis not present

## 2019-03-03 NOTE — Telephone Encounter (Signed)
Pt is scheduled for a colonoscopy tomorrow.  Pt's wife requested a CB to discuss concerns due to CoVid-19 and because the pt is diabetic and has asthma.

## 2019-03-03 NOTE — Telephone Encounter (Signed)
Wife and patient just wanted to be sure we were aware of pt's diabetes and asthma in light of Covid-19. Spoke with Dr. Henrene Pastor who feels patient is at low risk for having procedure. He is not encouraging them to cancel but offers them the opportunity if they do not feel comfortable proceeding. Both patient and wife wish to keep the appointment and will be here at 730 am.

## 2019-03-03 NOTE — Telephone Encounter (Signed)
Covid-19 travel screening questions  Have you traveled in the last 14 days?  No travel  If yes where? n/a  Do you now or have you had a fever in the last 14 days? No fevers  Do you have any respiratory symptoms of shortness of breath or cough now or in the last 14 days? No symptoms  Do you have a medical history of Congestive Heart Failure?   Do you have a medical history of lung disease?  Do you have any family members or close contacts with diagnosed or suspected Covid-19? No

## 2019-03-04 ENCOUNTER — Encounter: Payer: Self-pay | Admitting: Internal Medicine

## 2019-03-04 ENCOUNTER — Other Ambulatory Visit: Payer: Self-pay

## 2019-03-04 ENCOUNTER — Ambulatory Visit (AMBULATORY_SURGERY_CENTER): Payer: Medicare Other | Admitting: Internal Medicine

## 2019-03-04 VITALS — BP 146/77 | HR 57 | Temp 98.0°F | Resp 14 | Ht 70.0 in | Wt 211.0 lb

## 2019-03-04 DIAGNOSIS — Z860100 Personal history of colon polyps, unspecified: Secondary | ICD-10-CM

## 2019-03-04 DIAGNOSIS — K219 Gastro-esophageal reflux disease without esophagitis: Secondary | ICD-10-CM | POA: Diagnosis not present

## 2019-03-04 DIAGNOSIS — D124 Benign neoplasm of descending colon: Secondary | ICD-10-CM

## 2019-03-04 DIAGNOSIS — J45909 Unspecified asthma, uncomplicated: Secondary | ICD-10-CM | POA: Diagnosis not present

## 2019-03-04 HISTORY — PX: COLONOSCOPY: SHX174

## 2019-03-04 MED ORDER — SODIUM CHLORIDE 0.9 % IV SOLN: 500.0000 mL | Freq: Once | INTRAVENOUS | Status: DC

## 2019-03-04 NOTE — Patient Instructions (Signed)
Handouts given for polyps, diverticulosis and hemorrhoids.  YOU HAD AN ENDOSCOPIC PROCEDURE TODAY AT THE [facility]:   Refer to the procedure report that was given to you for any specific questions about what was found during the examination.  If the procedure report does not answer your questions, please call your gastroenterologist to clarify.  If you requested that your care partner not be given the details of your procedure findings, then the procedure report has been included in a sealed envelope for you to review at your convenience later.  YOU SHOULD EXPECT: Some feelings of bloating in the abdomen. Passage of more gas than usual.  Walking can help get rid of the air that was put into your GI tract during the procedure and reduce the bloating. If you had a lower endoscopy (such as a colonoscopy or flexible sigmoidoscopy) you may notice spotting of blood in your stool or on the toilet paper. If you underwent a bowel prep for your procedure, you may not have a normal bowel movement for a few days.  Please Note:  You might notice some irritation and congestion in your nose or some drainage.  This is from the oxygen used during your procedure.  There is no need for concern and it should clear up in a day or so.  SYMPTOMS TO REPORT IMMEDIATELY:   Following lower endoscopy (colonoscopy or flexible sigmoidoscopy):  Excessive amounts of blood in the stool  Significant tenderness or worsening of abdominal pains  Swelling of the abdomen that is new, acute  Fever of 100F or higher  For urgent or emergent issues, a gastroenterologist can be reached at any hour by calling [phone].   DIET:  We do recommend a small meal at first, but then you may proceed to your regular diet.  Drink plenty of fluids but you should avoid alcoholic beverages for 24 hours.  ACTIVITY:  You should plan to take it easy for the rest of today and you should NOT DRIVE or use heavy machinery until tomorrow  (because of the sedation medicines used during the test).    FOLLOW UP: Our staff will call the number listed on your records the next business day following your procedure to check on you and address any questions or concerns that you may have regarding the information given to you following your procedure. If we do not reach you, we will leave a message.  However, if you are feeling well and you are not experiencing any problems, there is no need to return our call.  We will assume that you have returned to your regular daily activities without incident.  If any biopsies were taken you will be contacted by phone or by letter within the next 1-3 weeks.  Please call us at [phone] if you have not heard about the biopsies in 3 weeks.    SIGNATURES/CONFIDENTIALITY: You and/or your care partner have signed paperwork which will be entered into your electronic medical record.  These signatures attest to the fact that that the information above on your After Visit Summary has been reviewed and is understood.  Full responsibility of the confidentiality of this discharge information lies with you and/or your care-partner. 

## 2019-03-04 NOTE — Progress Notes (Signed)
Called to room to assist during endoscopic procedure.  Patient ID and intended procedure confirmed with present staff. Received instructions for my participation in the procedure from the performing physician.  

## 2019-03-04 NOTE — Progress Notes (Signed)
Report to PACU, RN, vss, BBS= Clear.  

## 2019-03-04 NOTE — Progress Notes (Signed)
Pt's states no medical or surgical changes since previsit or office visit. 

## 2019-03-04 NOTE — Op Note (Signed)
[facility] Patient Name: Brian Ayers Procedure Date: 03/04/2019 8:47 AM MRN: [id number] Endoscopist: Docia Chuck. Henrene Pastor , MD Age: 68 Referring MD:  Date of Birth: Oct 04, 1951 Gender: Male Account #: [id number] Procedure:                Colonoscopy with cold snare polypectomy x 4 Indications:              High risk colon cancer surveillance: Personal                            history of adenoma (10 mm or greater in size), High                            risk colon cancer surveillance: Personal history of                            adenoma with villous component, High risk colon                            cancer surveillance: Personal history of multiple                            (3 or more) adenomas. Previous examinations 2005,                            2009, 2015 Medicines:                Monitored Anesthesia Care Procedure:                Pre-Anesthesia Assessment:                           - Prior to the procedure, a History and Physical                            was performed, and patient medications and                            allergies were reviewed. The patient's tolerance of                            previous anesthesia was also reviewed. The risks                            and benefits of the procedure and the sedation                            options and risks were discussed with the patient.                            All questions were answered, and informed consent                            was obtained. Prior Anticoagulants: The patient has  taken no previous anticoagulant or antiplatelet                            agents. ASA Grade Assessment: II - A patient with                            mild systemic disease. After reviewing the risks                            and benefits, the patient was deemed in                            satisfactory condition to undergo the procedure.                           After obtaining  informed consent, the colonoscope                            was passed under direct vision. Throughout the                            procedure, the patient's blood pressure, pulse, and                            oxygen saturations were monitored continuously. The                            Colonoscope was introduced through the anus and                            advanced to the the cecum, identified by                            appendiceal orifice and ileocecal valve. The                            ileocecal valve, appendiceal orifice, and rectum                            were photographed. The quality of the bowel                            preparation was excellent. The colonoscopy was                            performed without difficulty. The patient tolerated                            the procedure well. The bowel preparation used was                            SUPREP. Scope In: 9:00:06 AM Scope Out: 9:16:23 AM Scope Withdrawal Time: 0 hours 12 minutes 37 seconds  Total Procedure Duration: 0 hours 16 minutes 17  seconds  Findings:                 Four polyps were found in the descending colon. The                            polyps were 3 to 10 mm in size. These polyps were                            removed with a cold snare. Resection and retrieval                            were complete.                           Multiple diverticula were found in the ascending                            colon and left colon.                           The exam was otherwise without abnormality on                            direct and retroflexion views. Complications:            No immediate complications. Estimated blood loss:                            None. Estimated Blood Loss:     Estimated blood loss: none. Impression:               - Four 3 to 10 mm polyps in the descending colon,                            removed with a cold snare. Resected and retrieved.                            - Diverticulosis in the ascending colon and in the                            left colon.                           - The examination was otherwise normal on direct                            and retroflexion views. Recommendation:           - Repeat colonoscopy in 3 years for surveillance.                           - Patient has a contact number available for                            emergencies. The signs and symptoms of potential  delayed complications were discussed with the                            patient. Return to normal activities tomorrow.                            Written discharge instructions were provided to the                            patient.                           - Resume previous diet.                           - Continue present medications.                           - Await pathology results. Docia Chuck. Henrene Pastor, MD 03/04/2019 9:25:55 AM This report has been signed electronically.

## 2019-03-05 ENCOUNTER — Telehealth: Payer: Self-pay

## 2019-03-05 NOTE — Telephone Encounter (Signed)
  Follow up Call-  Call back number 03/04/2019  Post procedure Call Back phone  # [phone]  Permission to leave phone message Yes  Some recent data might be hidden     Patient questions:  Do you have a fever, pain , or abdominal swelling? No. Pain Score  0 *  Have you tolerated food without any problems? Yes.    Have you been able to return to your normal activities? Yes.    Do you have any questions about your discharge instructions: Diet   No. Medications  No. Follow up visit  No.  Do you have questions or concerns about your Care? No.  Actions: * If pain score is 4 or above: No action needed, pain <4.

## 2019-03-06 ENCOUNTER — Encounter: Payer: Self-pay | Admitting: Internal Medicine

## 2019-04-04 ENCOUNTER — Other Ambulatory Visit: Payer: Self-pay | Admitting: Internal Medicine

## 2019-05-19 DIAGNOSIS — C44622 Squamous cell carcinoma of skin of right upper limb, including shoulder: Secondary | ICD-10-CM | POA: Diagnosis not present

## 2019-05-19 DIAGNOSIS — L988 Other specified disorders of the skin and subcutaneous tissue: Secondary | ICD-10-CM | POA: Diagnosis not present

## 2019-05-23 ENCOUNTER — Telehealth: Payer: Self-pay | Admitting: *Deleted

## 2019-05-23 NOTE — Telephone Encounter (Signed)
Copied from [place] [phone]. Topic: Appointment Scheduling - Scheduling Inquiry for Clinic >> May 23, 2019  8:47 AM Lennox Solders wrote: Reason for CRM: pt wife is calling and her husband would a1c prior to 06-11-2019 appt

## 2019-05-26 NOTE — Telephone Encounter (Signed)
He has a standing order for hemoglobin A1c.  Thanks

## 2019-05-27 NOTE — Telephone Encounter (Signed)
Notified pt/wife w/MD response../lmb 

## 2019-05-29 DIAGNOSIS — J301 Allergic rhinitis due to pollen: Secondary | ICD-10-CM | POA: Diagnosis not present

## 2019-05-29 DIAGNOSIS — J4520 Mild intermittent asthma, uncomplicated: Secondary | ICD-10-CM | POA: Diagnosis not present

## 2019-05-29 DIAGNOSIS — J3081 Allergic rhinitis due to animal (cat) (dog) hair and dander: Secondary | ICD-10-CM | POA: Diagnosis not present

## 2019-05-29 DIAGNOSIS — J3089 Other allergic rhinitis: Secondary | ICD-10-CM | POA: Diagnosis not present

## 2019-06-02 DIAGNOSIS — J301 Allergic rhinitis due to pollen: Secondary | ICD-10-CM | POA: Diagnosis not present

## 2019-06-02 DIAGNOSIS — J3089 Other allergic rhinitis: Secondary | ICD-10-CM | POA: Diagnosis not present

## 2019-06-02 DIAGNOSIS — J3081 Allergic rhinitis due to animal (cat) (dog) hair and dander: Secondary | ICD-10-CM | POA: Diagnosis not present

## 2019-06-04 DIAGNOSIS — J3089 Other allergic rhinitis: Secondary | ICD-10-CM | POA: Diagnosis not present

## 2019-06-04 DIAGNOSIS — J301 Allergic rhinitis due to pollen: Secondary | ICD-10-CM | POA: Diagnosis not present

## 2019-06-04 DIAGNOSIS — J3081 Allergic rhinitis due to animal (cat) (dog) hair and dander: Secondary | ICD-10-CM | POA: Diagnosis not present

## 2019-06-09 DIAGNOSIS — J3089 Other allergic rhinitis: Secondary | ICD-10-CM | POA: Diagnosis not present

## 2019-06-09 DIAGNOSIS — J301 Allergic rhinitis due to pollen: Secondary | ICD-10-CM | POA: Diagnosis not present

## 2019-06-09 DIAGNOSIS — J3081 Allergic rhinitis due to animal (cat) (dog) hair and dander: Secondary | ICD-10-CM | POA: Diagnosis not present

## 2019-06-11 ENCOUNTER — Ambulatory Visit: Payer: Medicare Other | Admitting: Internal Medicine

## 2019-06-11 DIAGNOSIS — J3089 Other allergic rhinitis: Secondary | ICD-10-CM | POA: Diagnosis not present

## 2019-06-11 DIAGNOSIS — J301 Allergic rhinitis due to pollen: Secondary | ICD-10-CM | POA: Diagnosis not present

## 2019-06-11 DIAGNOSIS — J3081 Allergic rhinitis due to animal (cat) (dog) hair and dander: Secondary | ICD-10-CM | POA: Diagnosis not present

## 2019-06-16 DIAGNOSIS — J3081 Allergic rhinitis due to animal (cat) (dog) hair and dander: Secondary | ICD-10-CM | POA: Diagnosis not present

## 2019-06-16 DIAGNOSIS — J301 Allergic rhinitis due to pollen: Secondary | ICD-10-CM | POA: Diagnosis not present

## 2019-06-16 DIAGNOSIS — J3089 Other allergic rhinitis: Secondary | ICD-10-CM | POA: Diagnosis not present

## 2019-06-18 DIAGNOSIS — J3089 Other allergic rhinitis: Secondary | ICD-10-CM | POA: Diagnosis not present

## 2019-06-18 DIAGNOSIS — J301 Allergic rhinitis due to pollen: Secondary | ICD-10-CM | POA: Diagnosis not present

## 2019-06-18 DIAGNOSIS — J3081 Allergic rhinitis due to animal (cat) (dog) hair and dander: Secondary | ICD-10-CM | POA: Diagnosis not present

## 2019-06-23 DIAGNOSIS — J301 Allergic rhinitis due to pollen: Secondary | ICD-10-CM | POA: Diagnosis not present

## 2019-06-23 DIAGNOSIS — J3089 Other allergic rhinitis: Secondary | ICD-10-CM | POA: Diagnosis not present

## 2019-06-23 DIAGNOSIS — J3081 Allergic rhinitis due to animal (cat) (dog) hair and dander: Secondary | ICD-10-CM | POA: Diagnosis not present

## 2019-06-25 DIAGNOSIS — J3089 Other allergic rhinitis: Secondary | ICD-10-CM | POA: Diagnosis not present

## 2019-06-25 DIAGNOSIS — J3081 Allergic rhinitis due to animal (cat) (dog) hair and dander: Secondary | ICD-10-CM | POA: Diagnosis not present

## 2019-06-25 DIAGNOSIS — J301 Allergic rhinitis due to pollen: Secondary | ICD-10-CM | POA: Diagnosis not present

## 2019-06-25 IMAGING — CT CT HEART SCORING
2 series · 16 of 20 positions shown, 18 images · non-contrast
Comparison: None

Addendum:
EXAM:
OVER-READ INTERPRETATION  CT CHEST

The following report is an over-read performed by radiologist Dr.
Ivanoy Sureia [REDACTED] on 11/07/2018. This
over-read does not include interpretation of cardiac or coronary
anatomy or pathology. The coronary calcium score interpretation by
the cardiologist is attached.
CLINICAL DATA: Risk stratification
Coronary Calcium Score
TECHNIQUE: The patient was scanned on a Siemens Force scanner. Axial
non-contrast 3 mm slices were carried out through the heart. The
data set was analyzed on a dedicated work station and scored using
the [place] method.

[Series 2: casc 3.0 i36f 2 bestdiast 65 % · axial · 0.45mm/px · z∈[-284,-160]mm · 8 of 53 slices shown, 10 images]
[im 6/53  vessel]
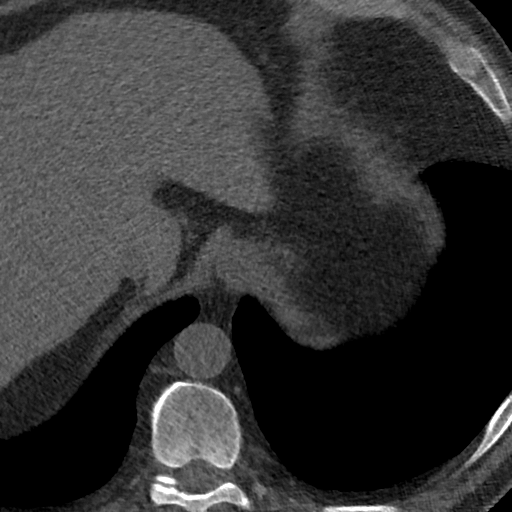
[im 6/53  lung]
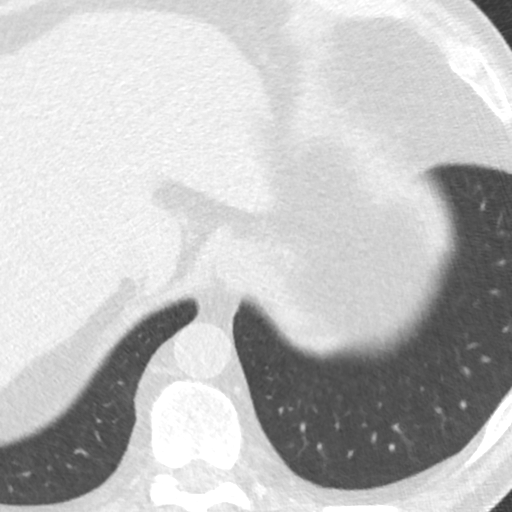
[im 12/53  vessel]
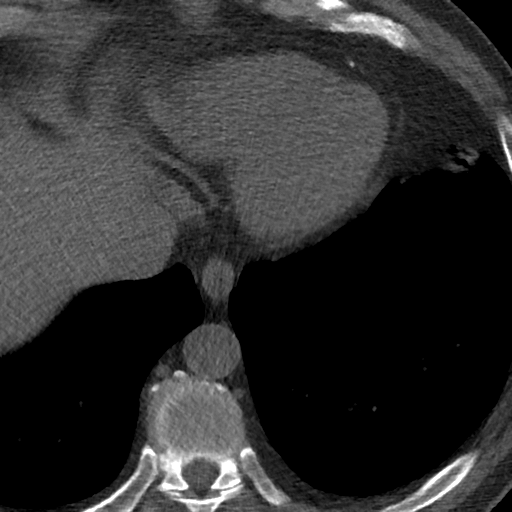
[im 18/53  vessel]
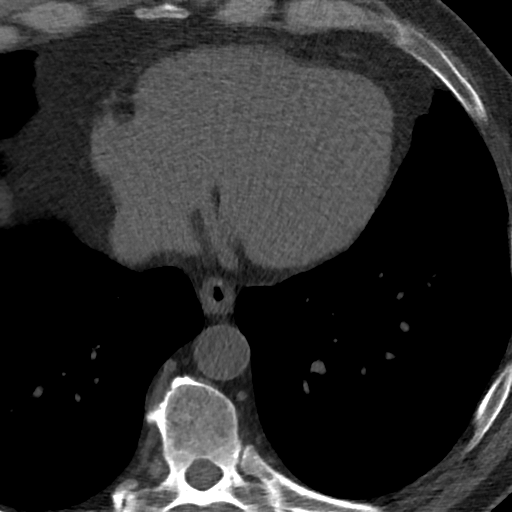
[im 24/53  vessel]
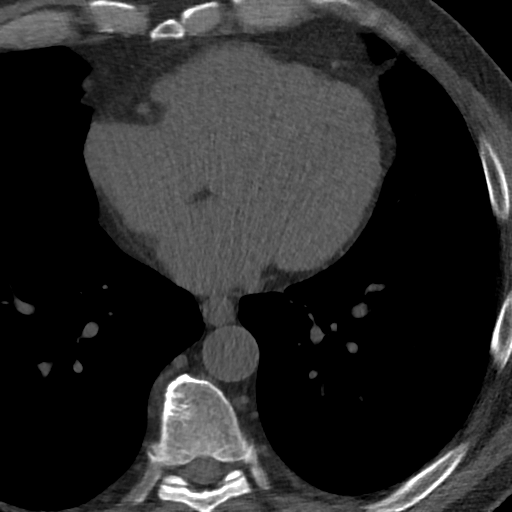
[im 29/53  vessel]
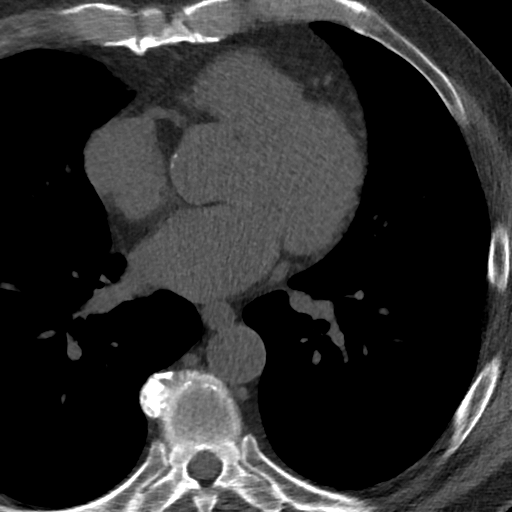
[im 29/53  lung]
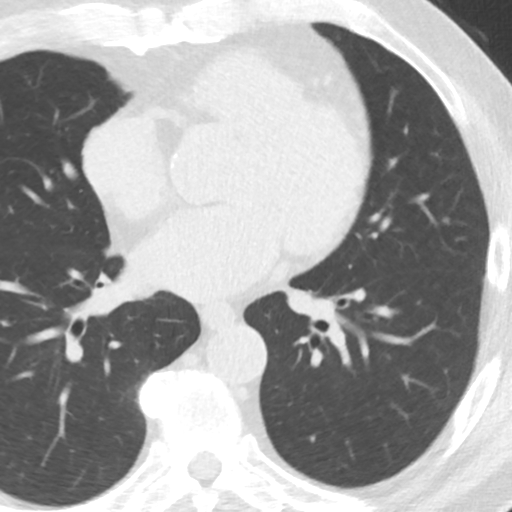
[im 35/53  vessel]
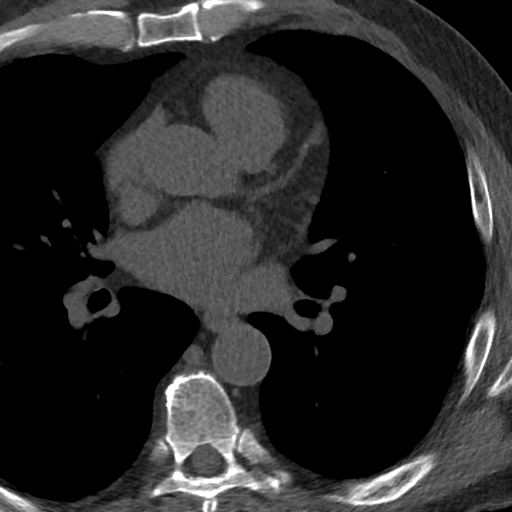
[im 41/53  vessel]
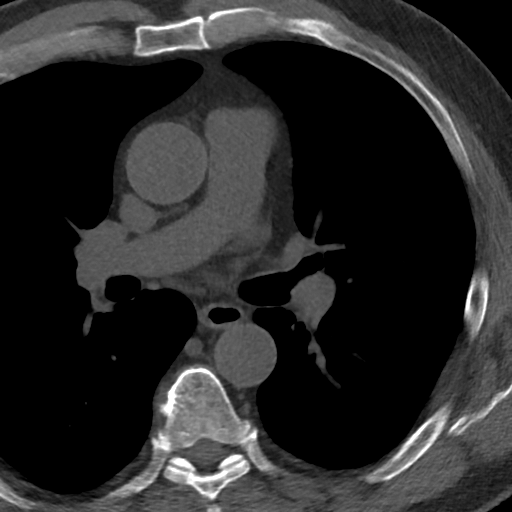
[im 47/53  vessel]
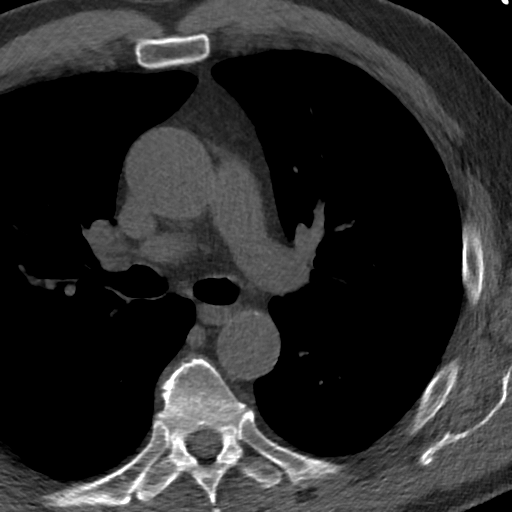

[Series 4: lung st 65 % · axial · 0.79mm/px · z∈[-284,-160]mm · 8 of 53 slices shown]
[im 6/53  lung]
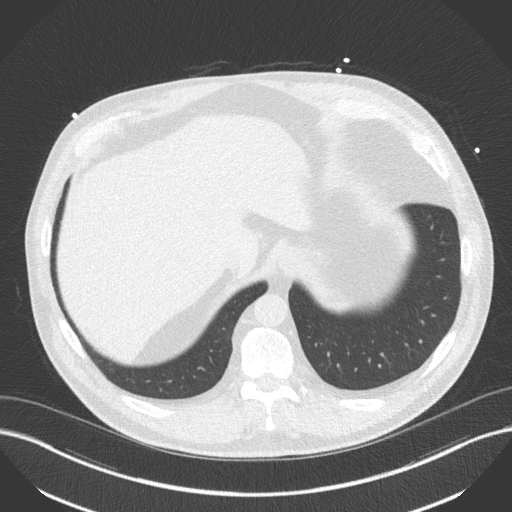
[im 12/53  lung]
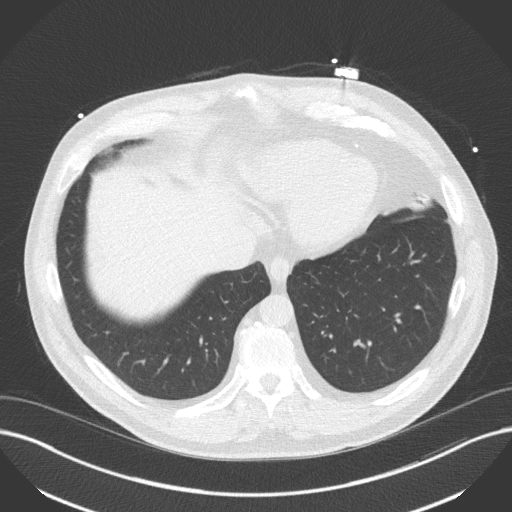
[im 18/53  lung]
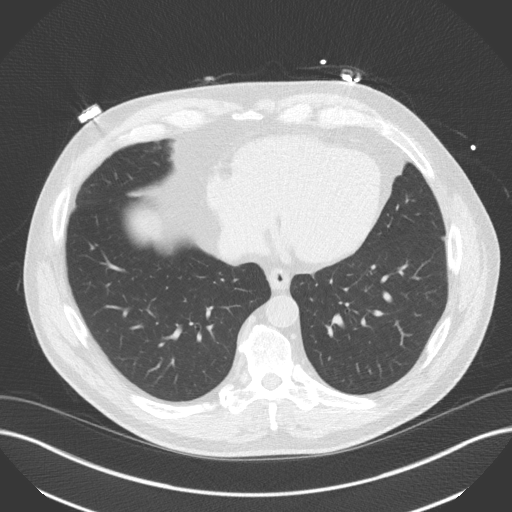
[im 24/53  lung]
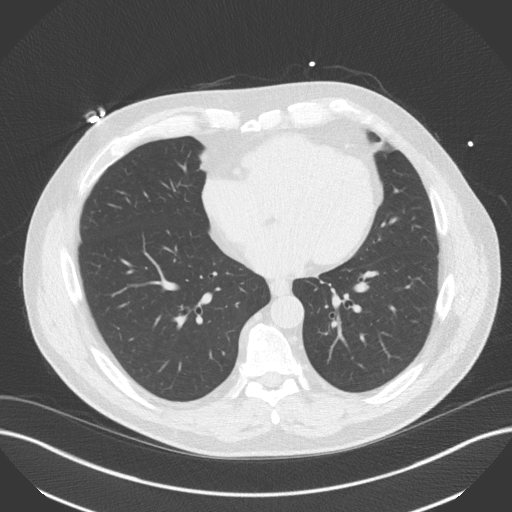
[im 29/53  lung]
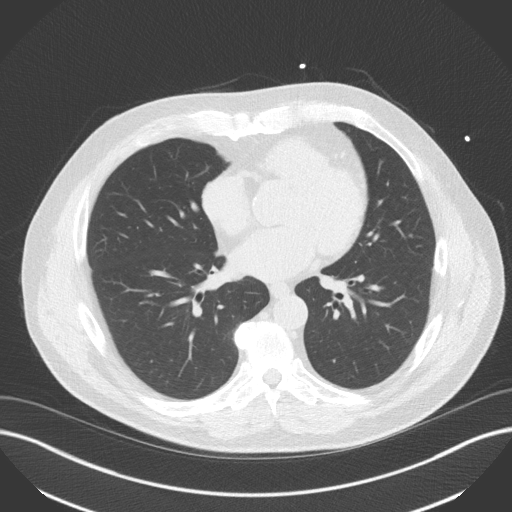
[im 35/53  lung]
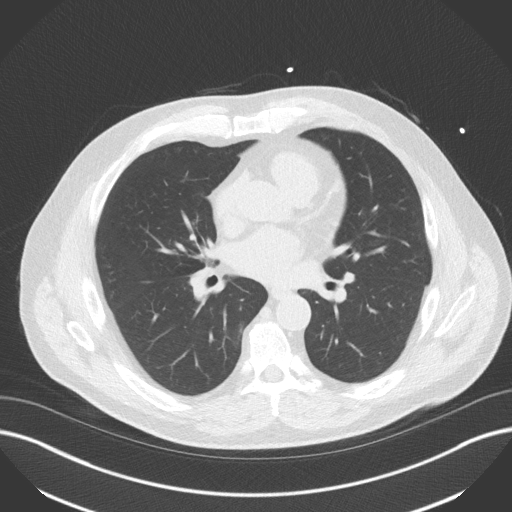
[im 41/53  lung]
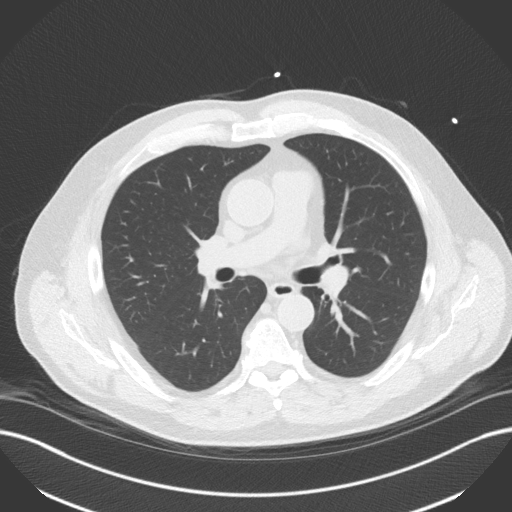
[im 47/53  lung]
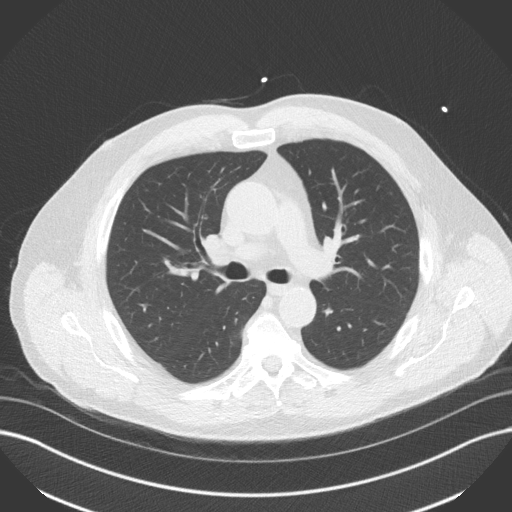

[16 of 20 positions shown; findings below may reference images not displayed]

FINDINGS: Vascular: Heart is normal size.  Aorta is normal caliber.

Mediastinum/Nodes: No adenopathy in the visualized lower mediastinum
or hila.

Lungs/Pleura: Visualized lungs clear.  No effusions.

Upper Abdomen: Imaging into the upper abdomen shows no acute
findings.

Musculoskeletal: Chest wall soft tissues are unremarkable. No acute
bony abnormality.
IMPRESSION: No acute or significant extracardiac abnormality.
FINDINGS: Non-cardiac: See separate report from [REDACTED].

Ascending Aorta: Normal size, mild diffuse calcifications in the
aortic arch and descending aorta.

Pericardium: Normal.

Coronary arteries: Normal origin.
IMPRESSION: Coronary calcium score of 76. This was 51 percentile for age and sex
matched control.

*** End of Addendum ***

## 2019-06-30 DIAGNOSIS — J3089 Other allergic rhinitis: Secondary | ICD-10-CM | POA: Diagnosis not present

## 2019-06-30 DIAGNOSIS — J301 Allergic rhinitis due to pollen: Secondary | ICD-10-CM | POA: Diagnosis not present

## 2019-06-30 DIAGNOSIS — J3081 Allergic rhinitis due to animal (cat) (dog) hair and dander: Secondary | ICD-10-CM | POA: Diagnosis not present

## 2019-07-02 DIAGNOSIS — J3089 Other allergic rhinitis: Secondary | ICD-10-CM | POA: Diagnosis not present

## 2019-07-02 DIAGNOSIS — J301 Allergic rhinitis due to pollen: Secondary | ICD-10-CM | POA: Diagnosis not present

## 2019-07-02 DIAGNOSIS — J3081 Allergic rhinitis due to animal (cat) (dog) hair and dander: Secondary | ICD-10-CM | POA: Diagnosis not present

## 2019-07-02 DIAGNOSIS — Z961 Presence of intraocular lens: Secondary | ICD-10-CM | POA: Diagnosis not present

## 2019-07-02 DIAGNOSIS — H2511 Age-related nuclear cataract, right eye: Secondary | ICD-10-CM | POA: Diagnosis not present

## 2019-07-02 DIAGNOSIS — H31092 Other chorioretinal scars, left eye: Secondary | ICD-10-CM | POA: Diagnosis not present

## 2019-07-02 DIAGNOSIS — E119 Type 2 diabetes mellitus without complications: Secondary | ICD-10-CM | POA: Diagnosis not present

## 2019-07-02 LAB — HM DIABETES EYE EXAM

## 2019-07-04 ENCOUNTER — Encounter: Payer: Self-pay | Admitting: Internal Medicine

## 2019-07-07 ENCOUNTER — Other Ambulatory Visit (INDEPENDENT_AMBULATORY_CARE_PROVIDER_SITE_OTHER): Payer: Medicare Other

## 2019-07-07 DIAGNOSIS — J3089 Other allergic rhinitis: Secondary | ICD-10-CM | POA: Diagnosis not present

## 2019-07-07 DIAGNOSIS — J3081 Allergic rhinitis due to animal (cat) (dog) hair and dander: Secondary | ICD-10-CM | POA: Diagnosis not present

## 2019-07-07 DIAGNOSIS — J301 Allergic rhinitis due to pollen: Secondary | ICD-10-CM | POA: Diagnosis not present

## 2019-07-07 DIAGNOSIS — E119 Type 2 diabetes mellitus without complications: Secondary | ICD-10-CM

## 2019-07-07 LAB — BASIC METABOLIC PANEL
BUN: 21 mg/dL (ref 6–23)
CO2: 25 mEq/L (ref 19–32)
Calcium: 9.2 mg/dL (ref 8.4–10.5)
Chloride: 103 mEq/L (ref 96–112)
Creatinine, Ser: 0.99 mg/dL (ref 0.40–1.50)
GFR: 75.61 mL/min (ref >60.00)
Glucose, Bld: 133 mg/dL — ABNORMAL HIGH (ref 70–99)
Potassium: 4.5 mEq/L (ref 3.5–5.1)
Sodium: 138 mEq/L (ref 135–145)

## 2019-07-07 LAB — HEMOGLOBIN A1C: Hgb A1c MFr Bld: 6.9 % — ABNORMAL HIGH (ref 4.6–6.5)

## 2019-07-09 DIAGNOSIS — J3081 Allergic rhinitis due to animal (cat) (dog) hair and dander: Secondary | ICD-10-CM | POA: Diagnosis not present

## 2019-07-09 DIAGNOSIS — J3089 Other allergic rhinitis: Secondary | ICD-10-CM | POA: Diagnosis not present

## 2019-07-09 DIAGNOSIS — J301 Allergic rhinitis due to pollen: Secondary | ICD-10-CM | POA: Diagnosis not present

## 2019-07-14 DIAGNOSIS — J301 Allergic rhinitis due to pollen: Secondary | ICD-10-CM | POA: Diagnosis not present

## 2019-07-14 DIAGNOSIS — J3089 Other allergic rhinitis: Secondary | ICD-10-CM | POA: Diagnosis not present

## 2019-07-14 DIAGNOSIS — J3081 Allergic rhinitis due to animal (cat) (dog) hair and dander: Secondary | ICD-10-CM | POA: Diagnosis not present

## 2019-07-15 ENCOUNTER — Encounter: Payer: Self-pay | Admitting: Internal Medicine

## 2019-07-15 ENCOUNTER — Ambulatory Visit (INDEPENDENT_AMBULATORY_CARE_PROVIDER_SITE_OTHER): Payer: Medicare Other | Admitting: Internal Medicine

## 2019-07-15 ENCOUNTER — Other Ambulatory Visit: Payer: Self-pay

## 2019-07-15 VITALS — BP 154/86 | HR 65 | Temp 99.0°F | Ht 70.0 in | Wt 204.0 lb

## 2019-07-15 DIAGNOSIS — Z23 Encounter for immunization: Secondary | ICD-10-CM

## 2019-07-15 DIAGNOSIS — E785 Hyperlipidemia, unspecified: Secondary | ICD-10-CM

## 2019-07-15 DIAGNOSIS — K219 Gastro-esophageal reflux disease without esophagitis: Secondary | ICD-10-CM | POA: Diagnosis not present

## 2019-07-15 DIAGNOSIS — E119 Type 2 diabetes mellitus without complications: Secondary | ICD-10-CM | POA: Diagnosis not present

## 2019-07-15 MED ORDER — ONETOUCH VERIO VI STRP: ORAL_STRIP | 1 refills | Status: DC

## 2019-07-15 NOTE — Assessment & Plan Note (Signed)
On Pepcid 

## 2019-07-15 NOTE — Patient Instructions (Signed)

## 2019-07-15 NOTE — Assessment & Plan Note (Signed)
On Crestor 

## 2019-07-15 NOTE — Assessment & Plan Note (Signed)
Metformin 

## 2019-07-15 NOTE — Progress Notes (Signed)
Subjective:  Patient ID: Brian Ayers, male    DOB: [birth year]  Age: 68 y.o. MRN: [id number]  CC: No chief complaint on file.   HPI Brian Ayers presents for DM, HTN, allergies f/u  Outpatient Medications Prior to Visit  Medication Sig Dispense Refill  . ADVAIR DISKUS 100-50 MCG/DOSE AEPB INHALE 1 PUFF INTO THE LUNGS TWO TIMES DAILY 180 each 3  . aspirin 81 MG chewable tablet Chew 81 mg by mouth daily.    Marland Kitchen azelastine (ASTELIN) 0.1 % nasal spray USE 1 SPRAY INTO EACH NOSTRIL TWICE A DAY    . Biotin 2500 MCG CAPS Take 2,500 mcg by mouth daily.     Marland Kitchen EPINEPHrine 0.3 mg/0.3 mL IJ SOAJ injection as needed.  0  . famotidine (PEPCID) 20 MG tablet Take 1 tablet (20 mg total) by mouth 2 (two) times daily. 180 tablet 3  . fluticasone (FLONASE) 50 MCG/ACT nasal spray SPRAY 1 SPRAY INTO EACH NOSTRIL EVERY DAY    . loratadine (CLARITIN) 10 MG tablet TAKE 1 TABLET (10 MG TOTAL) BY MOUTH DAILY. 90 tablet 3  . metFORMIN (GLUCOPHAGE) 500 MG tablet 1000 mg AM, 500 mg PM 270 tablet 3  . ONETOUCH DELICA LANCETS 48N MISC USE TO CHECK BS TWICE A DAY 100 each 5  . ONETOUCH VERIO test strip USE TO CHECK BLOOD SUGAR TWICE A DAY DX E11.9 300 each 1  . OVER THE COUNTER MEDICATION Saw Palmetto 450 mg, twice daily.    Marland Kitchen OVER THE COUNTER MEDICATION Vitamin D 3 One capsule daily.    Marland Kitchen PROAIR HFA 108 (90 BASE) MCG/ACT inhaler as needed.  0  . rosuvastatin (CRESTOR) 10 MG tablet Take 1 tablet (10 mg total) by mouth daily. 90 tablet 3  . UNABLE TO FIND Med Name: Takes allergy shots weekly    . sildenafil (VIAGRA) 100 MG tablet Take 1 tablet (100 mg total) by mouth as needed for erectile dysfunction. 12 tablet 11  . Azelastine-Fluticasone (DYMISTA) 137-50 MCG/ACT SUSP 1 spr each nostril bid (Patient not taking: Reported on 07/15/2019) 3 Bottle 3   No facility-administered medications prior to visit.     ROS: Review of Systems  Constitutional: Negative for appetite change, fatigue and unexpected weight  change.  HENT: Negative for congestion, nosebleeds, sneezing, sore throat and trouble swallowing.   Eyes: Negative for itching and visual disturbance.  Respiratory: Negative for cough.   Cardiovascular: Negative for chest pain, palpitations and leg swelling.  Gastrointestinal: Negative for abdominal distention, blood in stool, diarrhea and nausea.  Genitourinary: Negative for frequency and hematuria.  Musculoskeletal: Negative for back pain, gait problem, joint swelling and neck pain.  Skin: Negative for rash.  Neurological: Negative for dizziness, tremors, speech difficulty and weakness.  Psychiatric/Behavioral: Negative for agitation, dysphoric mood, sleep disturbance and suicidal ideas. The patient is not nervous/anxious.     Objective:  BP (!) 154/86 (BP Location: Left Arm, Patient Position: Sitting, Cuff Size: Large)   Pulse 65   Temp 99 F (37.2 C) (Oral)   Ht 5\' 10"  (1.778 m)   Wt 204 lb (92.5 kg)   SpO2 98%   BMI 29.27 kg/m   BP Readings from Last 3 Encounters:  07/15/19 (!) 154/86  03/04/19 (!) 146/77  02/27/19 (!) 144/82    Wt Readings from Last 3 Encounters:  07/15/19 204 lb (92.5 kg)  03/04/19 211 lb (95.7 kg)  02/27/19 207 lb (93.9 kg)    Physical Exam Constitutional:  General: He is not in acute distress.    Appearance: He is well-developed.     Comments: NAD  Eyes:     Conjunctiva/sclera: Conjunctivae normal.     Pupils: Pupils are equal, round, and reactive to light.  Neck:     Musculoskeletal: Normal range of motion.     Thyroid: No thyromegaly.     Vascular: No JVD.  Cardiovascular:     Rate and Rhythm: Normal rate and regular rhythm.     Heart sounds: Normal heart sounds. No murmur. No friction rub. No gallop.   Pulmonary:     Effort: Pulmonary effort is normal. No respiratory distress.     Breath sounds: Normal breath sounds. No wheezing or rales.  Chest:     Chest wall: No tenderness.  Abdominal:     General: Bowel sounds are normal.  There is no distension.     Palpations: Abdomen is soft. There is no mass.     Tenderness: There is no abdominal tenderness. There is no guarding or rebound.  Musculoskeletal: Normal range of motion.        General: No tenderness.  Lymphadenopathy:     Cervical: No cervical adenopathy.  Skin:    General: Skin is warm and dry.     Findings: No rash.  Neurological:     Mental Status: He is alert and oriented to person, place, and time.     Cranial Nerves: No cranial nerve deficit.     Motor: No abnormal muscle tone.     Coordination: Coordination normal.     Gait: Gait normal.     Deep Tendon Reflexes: Reflexes are normal and symmetric.  Psychiatric:        Behavior: Behavior normal.        Thought Content: Thought content normal.        Judgment: Judgment normal.     Lab Results  Component Value Date   WBC 6.5 07/01/2018   HGB 14.8 07/01/2018   HCT 42.1 07/01/2018   PLT 225.0 07/01/2018   GLUCOSE 133 (H) 07/07/2019   CHOL 130 03/25/2018   TRIG 75.0 03/25/2018   HDL 44.60 03/25/2018   LDLCALC 71 03/25/2018   ALT 18 07/01/2018   AST 17 07/01/2018   NA 138 07/07/2019   K 4.5 07/07/2019   CL 103 07/07/2019   CREATININE 0.99 07/07/2019   BUN 21 07/07/2019   CO2 25 07/07/2019   TSH 4.22 03/25/2018   PSA 0.90 03/25/2018   HGBA1C 6.9 (H) 07/07/2019   MICROALBUR <0.7 03/25/2018    Ct Cardiac Scoring  Addendum Date: 11/07/2018   ADDENDUM REPORT: 11/07/2018 17:47 CLINICAL DATA:  Risk stratification EXAM: Coronary Calcium Score TECHNIQUE: The patient was scanned on a [employer] scanner. Axial non-contrast 3 mm slices were carried out through the heart. The data set was analyzed on a dedicated work station and scored using the [place]. FINDINGS: Non-cardiac: See separate report from [facility]. Ascending Aorta: Normal size, mild diffuse calcifications in the aortic arch and descending aorta. Pericardium: Normal. Coronary arteries: Normal origin. IMPRESSION:  Coronary calcium score of 76. This was 82 percentile for age and sex matched control. Electronically Signed   By: Ena Dawley   On: 11/07/2018 17:47   Result Date: 11/07/2018 EXAM: OVER-READ INTERPRETATION  CT CHEST The following report is an over-read performed by radiologist Dr. Rolm Baptise of [facility] on 11/07/2018. This over-read does not include interpretation of cardiac or coronary anatomy or pathology. The  coronary calcium score interpretation by the cardiologist is attached. COMPARISON:  None FINDINGS: Vascular: Heart is normal size.  Aorta is normal caliber. Mediastinum/Nodes: No adenopathy in the visualized lower mediastinum or hila. Lungs/Pleura: Visualized lungs clear.  No effusions. Upper Abdomen: Imaging into the upper abdomen shows no acute findings. Musculoskeletal: Chest wall soft tissues are unremarkable. No acute bony abnormality. IMPRESSION: No acute or significant extracardiac abnormality. Electronically Signed: By: Rolm Baptise M.D. On: 11/07/2018 16:51    Assessment & Plan:   There are no diagnoses linked to this encounter.   No orders of the defined types were placed in this encounter.    Follow-up: No follow-ups on file.  Walker Kehr, MD

## 2019-07-21 DIAGNOSIS — J301 Allergic rhinitis due to pollen: Secondary | ICD-10-CM | POA: Diagnosis not present

## 2019-07-21 DIAGNOSIS — J3081 Allergic rhinitis due to animal (cat) (dog) hair and dander: Secondary | ICD-10-CM | POA: Diagnosis not present

## 2019-07-21 DIAGNOSIS — J3089 Other allergic rhinitis: Secondary | ICD-10-CM | POA: Diagnosis not present

## 2019-08-04 DIAGNOSIS — J3089 Other allergic rhinitis: Secondary | ICD-10-CM | POA: Diagnosis not present

## 2019-08-04 DIAGNOSIS — J301 Allergic rhinitis due to pollen: Secondary | ICD-10-CM | POA: Diagnosis not present

## 2019-08-04 DIAGNOSIS — J3081 Allergic rhinitis due to animal (cat) (dog) hair and dander: Secondary | ICD-10-CM | POA: Diagnosis not present

## 2019-08-18 DIAGNOSIS — J301 Allergic rhinitis due to pollen: Secondary | ICD-10-CM | POA: Diagnosis not present

## 2019-08-18 DIAGNOSIS — J3089 Other allergic rhinitis: Secondary | ICD-10-CM | POA: Diagnosis not present

## 2019-08-18 DIAGNOSIS — J3081 Allergic rhinitis due to animal (cat) (dog) hair and dander: Secondary | ICD-10-CM | POA: Diagnosis not present

## 2019-09-01 DIAGNOSIS — J3081 Allergic rhinitis due to animal (cat) (dog) hair and dander: Secondary | ICD-10-CM | POA: Diagnosis not present

## 2019-09-01 DIAGNOSIS — J301 Allergic rhinitis due to pollen: Secondary | ICD-10-CM | POA: Diagnosis not present

## 2019-09-01 DIAGNOSIS — J3089 Other allergic rhinitis: Secondary | ICD-10-CM | POA: Diagnosis not present

## 2019-09-09 DIAGNOSIS — J3089 Other allergic rhinitis: Secondary | ICD-10-CM | POA: Diagnosis not present

## 2019-09-09 DIAGNOSIS — J301 Allergic rhinitis due to pollen: Secondary | ICD-10-CM | POA: Diagnosis not present

## 2019-09-09 DIAGNOSIS — J3081 Allergic rhinitis due to animal (cat) (dog) hair and dander: Secondary | ICD-10-CM | POA: Diagnosis not present

## 2019-09-10 ENCOUNTER — Ambulatory Visit: Payer: Medicare Other

## 2019-09-15 DIAGNOSIS — J3089 Other allergic rhinitis: Secondary | ICD-10-CM | POA: Diagnosis not present

## 2019-09-15 DIAGNOSIS — J3081 Allergic rhinitis due to animal (cat) (dog) hair and dander: Secondary | ICD-10-CM | POA: Diagnosis not present

## 2019-09-15 DIAGNOSIS — J301 Allergic rhinitis due to pollen: Secondary | ICD-10-CM | POA: Diagnosis not present

## 2019-09-29 DIAGNOSIS — J3081 Allergic rhinitis due to animal (cat) (dog) hair and dander: Secondary | ICD-10-CM | POA: Diagnosis not present

## 2019-09-29 DIAGNOSIS — J301 Allergic rhinitis due to pollen: Secondary | ICD-10-CM | POA: Diagnosis not present

## 2019-09-29 DIAGNOSIS — J3089 Other allergic rhinitis: Secondary | ICD-10-CM | POA: Diagnosis not present

## 2019-10-01 ENCOUNTER — Ambulatory Visit (INDEPENDENT_AMBULATORY_CARE_PROVIDER_SITE_OTHER): Payer: Medicare Other

## 2019-10-01 ENCOUNTER — Other Ambulatory Visit: Payer: Self-pay

## 2019-10-01 DIAGNOSIS — Z23 Encounter for immunization: Secondary | ICD-10-CM | POA: Diagnosis not present

## 2019-10-06 DIAGNOSIS — J3089 Other allergic rhinitis: Secondary | ICD-10-CM | POA: Diagnosis not present

## 2019-10-06 DIAGNOSIS — J301 Allergic rhinitis due to pollen: Secondary | ICD-10-CM | POA: Diagnosis not present

## 2019-10-06 DIAGNOSIS — J3081 Allergic rhinitis due to animal (cat) (dog) hair and dander: Secondary | ICD-10-CM | POA: Diagnosis not present

## 2019-10-08 DIAGNOSIS — J3089 Other allergic rhinitis: Secondary | ICD-10-CM | POA: Diagnosis not present

## 2019-10-08 DIAGNOSIS — J3081 Allergic rhinitis due to animal (cat) (dog) hair and dander: Secondary | ICD-10-CM | POA: Diagnosis not present

## 2019-10-08 DIAGNOSIS — J301 Allergic rhinitis due to pollen: Secondary | ICD-10-CM | POA: Diagnosis not present

## 2019-10-13 DIAGNOSIS — J301 Allergic rhinitis due to pollen: Secondary | ICD-10-CM | POA: Diagnosis not present

## 2019-10-13 DIAGNOSIS — J3089 Other allergic rhinitis: Secondary | ICD-10-CM | POA: Diagnosis not present

## 2019-10-13 DIAGNOSIS — J3081 Allergic rhinitis due to animal (cat) (dog) hair and dander: Secondary | ICD-10-CM | POA: Diagnosis not present

## 2019-10-15 DIAGNOSIS — J301 Allergic rhinitis due to pollen: Secondary | ICD-10-CM | POA: Diagnosis not present

## 2019-10-15 DIAGNOSIS — J3089 Other allergic rhinitis: Secondary | ICD-10-CM | POA: Diagnosis not present

## 2019-10-15 DIAGNOSIS — J3081 Allergic rhinitis due to animal (cat) (dog) hair and dander: Secondary | ICD-10-CM | POA: Diagnosis not present

## 2019-10-20 DIAGNOSIS — J3081 Allergic rhinitis due to animal (cat) (dog) hair and dander: Secondary | ICD-10-CM | POA: Diagnosis not present

## 2019-10-20 DIAGNOSIS — J3089 Other allergic rhinitis: Secondary | ICD-10-CM | POA: Diagnosis not present

## 2019-10-20 DIAGNOSIS — J301 Allergic rhinitis due to pollen: Secondary | ICD-10-CM | POA: Diagnosis not present

## 2019-10-27 DIAGNOSIS — J3089 Other allergic rhinitis: Secondary | ICD-10-CM | POA: Diagnosis not present

## 2019-10-27 DIAGNOSIS — J3081 Allergic rhinitis due to animal (cat) (dog) hair and dander: Secondary | ICD-10-CM | POA: Diagnosis not present

## 2019-10-27 DIAGNOSIS — J301 Allergic rhinitis due to pollen: Secondary | ICD-10-CM | POA: Diagnosis not present

## 2019-10-29 DIAGNOSIS — H2513 Age-related nuclear cataract, bilateral: Secondary | ICD-10-CM | POA: Diagnosis not present

## 2019-11-03 DIAGNOSIS — J3089 Other allergic rhinitis: Secondary | ICD-10-CM | POA: Diagnosis not present

## 2019-11-03 DIAGNOSIS — J301 Allergic rhinitis due to pollen: Secondary | ICD-10-CM | POA: Diagnosis not present

## 2019-11-03 DIAGNOSIS — J3081 Allergic rhinitis due to animal (cat) (dog) hair and dander: Secondary | ICD-10-CM | POA: Diagnosis not present

## 2019-11-04 ENCOUNTER — Other Ambulatory Visit (INDEPENDENT_AMBULATORY_CARE_PROVIDER_SITE_OTHER): Payer: Medicare Other

## 2019-11-04 DIAGNOSIS — E119 Type 2 diabetes mellitus without complications: Secondary | ICD-10-CM | POA: Diagnosis not present

## 2019-11-04 LAB — BASIC METABOLIC PANEL
BUN: 18 mg/dL (ref 6–23)
CO2: 26 mEq/L (ref 19–32)
Calcium: 9.5 mg/dL (ref 8.4–10.5)
Chloride: 101 mEq/L (ref 96–112)
Creatinine, Ser: 0.9 mg/dL (ref 0.40–1.50)
GFR: 84.31 mL/min (ref >60.00)
Glucose, Bld: 142 mg/dL — ABNORMAL HIGH (ref 70–99)
Potassium: 4.1 mEq/L (ref 3.5–5.1)
Sodium: 138 mEq/L (ref 135–145)

## 2019-11-04 LAB — HEMOGLOBIN A1C: Hgb A1c MFr Bld: 6.9 % — ABNORMAL HIGH (ref 4.6–6.5)

## 2019-11-10 DIAGNOSIS — J3081 Allergic rhinitis due to animal (cat) (dog) hair and dander: Secondary | ICD-10-CM | POA: Diagnosis not present

## 2019-11-10 DIAGNOSIS — J301 Allergic rhinitis due to pollen: Secondary | ICD-10-CM | POA: Diagnosis not present

## 2019-11-10 DIAGNOSIS — J3089 Other allergic rhinitis: Secondary | ICD-10-CM | POA: Diagnosis not present

## 2019-11-12 ENCOUNTER — Other Ambulatory Visit: Payer: Self-pay | Admitting: Internal Medicine

## 2019-11-12 ENCOUNTER — Telehealth: Payer: Self-pay | Admitting: Internal Medicine

## 2019-11-12 NOTE — Telephone Encounter (Signed)
Caller name: Darnelle Maffucci  Relation to pt: from [place]  Call back number: [phone]  Pharmacy: [id number] - [place], [place] [address]  Reason for call:  Pharmacist states atorvastatin, pravastatin or simvastatin brand is cheaper then rosuvastatin (CRESTOR) 10 MG tablet, requesting an alternate

## 2019-11-12 NOTE — Telephone Encounter (Signed)
Please advise 

## 2019-11-15 NOTE — Telephone Encounter (Signed)
Okay.  Will switch to simvastatin.  Thanks

## 2019-11-17 DIAGNOSIS — J301 Allergic rhinitis due to pollen: Secondary | ICD-10-CM | POA: Diagnosis not present

## 2019-11-17 DIAGNOSIS — J3089 Other allergic rhinitis: Secondary | ICD-10-CM | POA: Diagnosis not present

## 2019-11-17 DIAGNOSIS — J3081 Allergic rhinitis due to animal (cat) (dog) hair and dander: Secondary | ICD-10-CM | POA: Diagnosis not present

## 2019-11-18 ENCOUNTER — Other Ambulatory Visit: Payer: Self-pay

## 2019-11-18 ENCOUNTER — Telehealth: Payer: Self-pay | Admitting: Internal Medicine

## 2019-11-18 ENCOUNTER — Encounter: Payer: Self-pay | Admitting: Internal Medicine

## 2019-11-18 ENCOUNTER — Ambulatory Visit (INDEPENDENT_AMBULATORY_CARE_PROVIDER_SITE_OTHER): Payer: Medicare Other | Admitting: Internal Medicine

## 2019-11-18 DIAGNOSIS — E119 Type 2 diabetes mellitus without complications: Secondary | ICD-10-CM | POA: Diagnosis not present

## 2019-11-18 DIAGNOSIS — R030 Elevated blood-pressure reading, without diagnosis of hypertension: Secondary | ICD-10-CM

## 2019-11-18 DIAGNOSIS — J4520 Mild intermittent asthma, uncomplicated: Secondary | ICD-10-CM | POA: Diagnosis not present

## 2019-11-18 NOTE — Assessment & Plan Note (Signed)
Nl BP at home - always

## 2019-11-18 NOTE — Assessment & Plan Note (Signed)
Advair Proventil prn 

## 2019-11-18 NOTE — Progress Notes (Signed)
Subjective:  Patient ID: Brian Ayers, male    DOB: 21-Nov-1951  Age: 68 y.o. MRN: [id number]  CC: No chief complaint on file.   HPI Brian Ayers presents for DM, HTN, GERD f/u SBP 130-135  Outpatient Medications Prior to Visit  Medication Sig Dispense Refill  . ADVAIR DISKUS 100-50 MCG/DOSE AEPB INHALE 1 PUFF INTO THE LUNGS TWO TIMES DAILY 180 each 3  . aspirin 81 MG chewable tablet Chew 81 mg by mouth daily.    Marland Kitchen azelastine (ASTELIN) 0.1 % nasal spray USE 1 SPRAY INTO EACH NOSTRIL TWICE A DAY    . Biotin 2500 MCG CAPS Take 2,500 mcg by mouth daily.     Marland Kitchen EPINEPHrine 0.3 mg/0.3 mL IJ SOAJ injection as needed.  0  . famotidine (PEPCID) 20 MG tablet Take 1 tablet (20 mg total) by mouth 2 (two) times daily. 180 tablet 3  . fluticasone (FLONASE) 50 MCG/ACT nasal spray SPRAY 1 SPRAY INTO EACH NOSTRIL EVERY DAY    . glucose blood (ONETOUCH VERIO) test strip USE TO CHECK BLOOD SUGAR TWICE A DAY DX E11.9 300 each 1  . loratadine (CLARITIN) 10 MG tablet TAKE 1 TABLET (10 MG TOTAL) BY MOUTH DAILY. 90 tablet 3  . metFORMIN (GLUCOPHAGE) 500 MG tablet 1000 mg AM, 500 mg PM 270 tablet 3  . ONETOUCH DELICA LANCETS 99991111 MISC USE TO CHECK BS TWICE A DAY 100 each 5  . OVER THE COUNTER MEDICATION Saw Palmetto 450 mg, twice daily.    Marland Kitchen OVER THE COUNTER MEDICATION Vitamin D 3 One capsule daily.    Marland Kitchen PROAIR HFA 108 (90 BASE) MCG/ACT inhaler as needed.  0  . simvastatin (ZOCOR) 40 MG tablet Please specify directions, refills and quantity 90 tablet 2  . UNABLE TO FIND Med Name: Takes allergy shots weekly    . sildenafil (VIAGRA) 100 MG tablet Take 1 tablet (100 mg total) by mouth as needed for erectile dysfunction. 12 tablet 11   No facility-administered medications prior to visit.     ROS: Review of Systems  Constitutional: Negative for appetite change, fatigue and unexpected weight change.  HENT: Negative for congestion, nosebleeds, sneezing, sore throat and trouble swallowing.   Eyes: Negative  for itching and visual disturbance.  Respiratory: Negative for cough.   Cardiovascular: Negative for chest pain, palpitations and leg swelling.  Gastrointestinal: Negative for abdominal distention, blood in stool, diarrhea and nausea.  Genitourinary: Negative for frequency and hematuria.  Musculoskeletal: Negative for back pain, gait problem, joint swelling and neck pain.  Skin: Negative for rash.  Neurological: Negative for dizziness, tremors, speech difficulty and weakness.  Psychiatric/Behavioral: Negative for agitation, dysphoric mood, sleep disturbance and suicidal ideas. The patient is not nervous/anxious.     Objective:  BP (!) 154/92 (BP Location: Left Arm, Patient Position: Sitting, Cuff Size: Large)   Pulse 68   Temp (!) 97.5 F (36.4 C) (Oral)   Ht 5\' 10"  (1.778 m)   Wt 209 lb (94.8 kg)   SpO2 99%   BMI 29.99 kg/m   BP Readings from Last 3 Encounters:  11/18/19 (!) 154/92  07/15/19 (!) 154/86  03/04/19 (!) 146/77    Wt Readings from Last 3 Encounters:  11/18/19 209 lb (94.8 kg)  07/15/19 204 lb (92.5 kg)  03/04/19 211 lb (95.7 kg)    Physical Exam Constitutional:      General: He is not in acute distress.    Appearance: He is well-developed.     Comments:  NAD  Eyes:     Conjunctiva/sclera: Conjunctivae normal.     Pupils: Pupils are equal, round, and reactive to light.  Neck:     Musculoskeletal: Normal range of motion.     Thyroid: No thyromegaly.     Vascular: No JVD.  Cardiovascular:     Rate and Rhythm: Normal rate and regular rhythm.     Heart sounds: Normal heart sounds. No murmur. No friction rub. No gallop.   Pulmonary:     Effort: Pulmonary effort is normal. No respiratory distress.     Breath sounds: Normal breath sounds. No wheezing or rales.  Chest:     Chest wall: No tenderness.  Abdominal:     General: Bowel sounds are normal. There is no distension.     Palpations: Abdomen is soft. There is no mass.     Tenderness: There is no  abdominal tenderness. There is no guarding or rebound.  Musculoskeletal: Normal range of motion.        General: No tenderness.  Lymphadenopathy:     Cervical: No cervical adenopathy.  Skin:    General: Skin is warm and dry.     Findings: No rash.  Neurological:     Mental Status: He is alert and oriented to person, place, and time.     Cranial Nerves: No cranial nerve deficit.     Motor: No abnormal muscle tone.     Coordination: Coordination normal.     Gait: Gait normal.     Deep Tendon Reflexes: Reflexes are normal and symmetric.  Psychiatric:        Behavior: Behavior normal.        Thought Content: Thought content normal.        Judgment: Judgment normal.     Lab Results  Component Value Date   WBC 6.5 07/01/2018   HGB 14.8 07/01/2018   HCT 42.1 07/01/2018   PLT 225.0 07/01/2018   GLUCOSE 142 (H) 11/04/2019   CHOL 130 03/25/2018   TRIG 75.0 03/25/2018   HDL 44.60 03/25/2018   LDLCALC 71 03/25/2018   ALT 18 07/01/2018   AST 17 07/01/2018   NA 138 11/04/2019   K 4.1 11/04/2019   CL 101 11/04/2019   CREATININE 0.90 11/04/2019   BUN 18 11/04/2019   CO2 26 11/04/2019   TSH 4.22 03/25/2018   PSA 0.90 03/25/2018   HGBA1C 6.9 (H) 11/04/2019   MICROALBUR <0.7 03/25/2018    Ct Cardiac Scoring  Addendum Date: 11/07/2018   ADDENDUM REPORT: 11/07/2018 17:47 CLINICAL DATA:  Risk stratification EXAM: Coronary Calcium Score TECHNIQUE: The patient was scanned on a [employer] scanner. Axial non-contrast 3 mm slices were carried out through the heart. The data set was analyzed on a dedicated work station and scored using the [place]. FINDINGS: Non-cardiac: See separate report from [facility]. Ascending Aorta: Normal size, mild diffuse calcifications in the aortic arch and descending aorta. Pericardium: Normal. Coronary arteries: Normal origin. IMPRESSION: Coronary calcium score of 76. This was 21 percentile for age and sex matched control. Electronically  Signed   By: Ena Dawley   On: 11/07/2018 17:47   Result Date: 11/07/2018 EXAM: OVER-READ INTERPRETATION  CT CHEST The following report is an over-read performed by radiologist Dr. Rolm Baptise of [facility] on 11/07/2018. This over-read does not include interpretation of cardiac or coronary anatomy or pathology. The coronary calcium score interpretation by the cardiologist is attached. COMPARISON:  None FINDINGS: Vascular: Heart is normal size.  Aorta is normal caliber. Mediastinum/Nodes: No adenopathy in the visualized lower mediastinum or hila. Lungs/Pleura: Visualized lungs clear.  No effusions. Upper Abdomen: Imaging into the upper abdomen shows no acute findings. Musculoskeletal: Chest wall soft tissues are unremarkable. No acute bony abnormality. IMPRESSION: No acute or significant extracardiac abnormality. Electronically Signed: By: Rolm Baptise M.D. On: 11/07/2018 16:51    Assessment & Plan:   There are no diagnoses linked to this encounter.   No orders of the defined types were placed in this encounter.    Follow-up: No follow-ups on file.  Walker Kehr, MD

## 2019-11-18 NOTE — Telephone Encounter (Signed)
Patients wife called stating that patient's cholesterol meds were going to be changed.  Spouse wanted to know if it was time for patient to have a lipid test?

## 2019-11-18 NOTE — Assessment & Plan Note (Signed)
Labs

## 2019-11-19 MED ORDER — SIMVASTATIN 40 MG PO TABS: 40.0000 mg | ORAL_TABLET | Freq: Every day | ORAL | 2 refills | Status: DC

## 2019-11-19 NOTE — Telephone Encounter (Signed)
Wife notified that we will check it at the next OV

## 2019-11-24 DIAGNOSIS — J3089 Other allergic rhinitis: Secondary | ICD-10-CM | POA: Diagnosis not present

## 2019-11-24 DIAGNOSIS — J3081 Allergic rhinitis due to animal (cat) (dog) hair and dander: Secondary | ICD-10-CM | POA: Diagnosis not present

## 2019-11-24 DIAGNOSIS — J301 Allergic rhinitis due to pollen: Secondary | ICD-10-CM | POA: Diagnosis not present

## 2019-11-25 DIAGNOSIS — E119 Type 2 diabetes mellitus without complications: Secondary | ICD-10-CM | POA: Diagnosis not present

## 2019-11-25 DIAGNOSIS — H31092 Other chorioretinal scars, left eye: Secondary | ICD-10-CM | POA: Diagnosis not present

## 2019-11-25 DIAGNOSIS — Z961 Presence of intraocular lens: Secondary | ICD-10-CM | POA: Diagnosis not present

## 2019-11-25 DIAGNOSIS — H43812 Vitreous degeneration, left eye: Secondary | ICD-10-CM | POA: Diagnosis not present

## 2019-11-25 DIAGNOSIS — H2511 Age-related nuclear cataract, right eye: Secondary | ICD-10-CM | POA: Diagnosis not present

## 2019-11-25 DIAGNOSIS — H35412 Lattice degeneration of retina, left eye: Secondary | ICD-10-CM | POA: Diagnosis not present

## 2019-11-27 ENCOUNTER — Other Ambulatory Visit: Payer: Self-pay | Admitting: Internal Medicine

## 2019-11-27 MED ORDER — VALSARTAN 160 MG PO TABS: 160.0000 mg | ORAL_TABLET | Freq: Every day | ORAL | 11 refills | Status: DC

## 2019-12-03 ENCOUNTER — Other Ambulatory Visit: Payer: Self-pay

## 2019-12-03 ENCOUNTER — Encounter: Payer: Self-pay | Admitting: Podiatry

## 2019-12-03 ENCOUNTER — Ambulatory Visit (INDEPENDENT_AMBULATORY_CARE_PROVIDER_SITE_OTHER): Payer: Medicare Other | Admitting: Podiatry

## 2019-12-03 ENCOUNTER — Ambulatory Visit: Payer: Self-pay | Admitting: Podiatry

## 2019-12-03 DIAGNOSIS — E119 Type 2 diabetes mellitus without complications: Secondary | ICD-10-CM | POA: Insufficient documentation

## 2019-12-03 NOTE — Progress Notes (Signed)
This patient presents to the office with chief complaint of callus  and diabetic feet.  This patient  says there  is  no pain and discomfort in his  feet.  He says he has callus on the balls of both feet which was treated years ago by Dr.  Blenda Mounts with acid. Patient has no history of infection or drainage from both feet.  . This patient presents  to the office today for treatment of his callus  and a foot evaluation due to history of  diabetes.  General Appearance  Alert, conversant and in no acute stress.  Vascular  Dorsalis pedis and posterior tibial  pulses are palpable  bilaterally.  Capillary return is within normal limits  bilaterally. Temperature is within normal limits  bilaterally.  Neurologic  Senn-Weinstein monofilament wire test within normal limits  bilaterally. Muscle power within normal limits bilaterally.  Nails Normal nails with no signs of infection or drainage.  Orthopedic  No limitations of motion of motion feet .  No crepitus or effusions noted.  No bony pathology or digital deformities noted. 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  Skin  normotropic skin with no porokeratosis noted bilaterally.  No signs of infections or ulcers noted.  Diffuse callus  B/L  Diabetes with no foot complications  IE    A diabetic foot exam was performed and there is no evidence of any vascular or neurologic pathology.  Discussed treatment of callus.  He will return if callus become synptomatic.   RTC 3 months.   Gardiner Barefoot DPM

## 2019-12-04 ENCOUNTER — Ambulatory Visit: Payer: Medicare Other | Admitting: Podiatry

## 2019-12-05 ENCOUNTER — Other Ambulatory Visit: Payer: Self-pay | Admitting: Internal Medicine

## 2019-12-16 ENCOUNTER — Encounter: Payer: Self-pay | Admitting: Internal Medicine

## 2019-12-16 ENCOUNTER — Other Ambulatory Visit: Payer: Self-pay | Admitting: Internal Medicine

## 2019-12-16 MED ORDER — ROSUVASTATIN CALCIUM 10 MG PO TABS: 10.0000 mg | ORAL_TABLET | Freq: Every day | ORAL | 3 refills | Status: DC

## 2019-12-29 DIAGNOSIS — J3081 Allergic rhinitis due to animal (cat) (dog) hair and dander: Secondary | ICD-10-CM | POA: Diagnosis not present

## 2019-12-29 DIAGNOSIS — J3089 Other allergic rhinitis: Secondary | ICD-10-CM | POA: Diagnosis not present

## 2019-12-29 DIAGNOSIS — J301 Allergic rhinitis due to pollen: Secondary | ICD-10-CM | POA: Diagnosis not present

## 2020-01-05 DIAGNOSIS — J3089 Other allergic rhinitis: Secondary | ICD-10-CM | POA: Diagnosis not present

## 2020-01-05 DIAGNOSIS — J301 Allergic rhinitis due to pollen: Secondary | ICD-10-CM | POA: Diagnosis not present

## 2020-01-05 DIAGNOSIS — J3081 Allergic rhinitis due to animal (cat) (dog) hair and dander: Secondary | ICD-10-CM | POA: Diagnosis not present

## 2020-01-16 ENCOUNTER — Ambulatory Visit: Payer: Medicare Other

## 2020-01-19 DIAGNOSIS — J3089 Other allergic rhinitis: Secondary | ICD-10-CM | POA: Diagnosis not present

## 2020-01-19 DIAGNOSIS — J301 Allergic rhinitis due to pollen: Secondary | ICD-10-CM | POA: Diagnosis not present

## 2020-01-19 DIAGNOSIS — J3081 Allergic rhinitis due to animal (cat) (dog) hair and dander: Secondary | ICD-10-CM | POA: Diagnosis not present

## 2020-01-23 ENCOUNTER — Ambulatory Visit: Payer: Medicare Other

## 2020-01-26 DIAGNOSIS — J301 Allergic rhinitis due to pollen: Secondary | ICD-10-CM | POA: Diagnosis not present

## 2020-01-26 DIAGNOSIS — J3081 Allergic rhinitis due to animal (cat) (dog) hair and dander: Secondary | ICD-10-CM | POA: Diagnosis not present

## 2020-01-26 DIAGNOSIS — J3089 Other allergic rhinitis: Secondary | ICD-10-CM | POA: Diagnosis not present

## 2020-02-02 ENCOUNTER — Ambulatory Visit: Payer: Medicare Other

## 2020-02-02 DIAGNOSIS — J3089 Other allergic rhinitis: Secondary | ICD-10-CM | POA: Diagnosis not present

## 2020-02-02 DIAGNOSIS — J301 Allergic rhinitis due to pollen: Secondary | ICD-10-CM | POA: Diagnosis not present

## 2020-02-02 DIAGNOSIS — J3081 Allergic rhinitis due to animal (cat) (dog) hair and dander: Secondary | ICD-10-CM | POA: Diagnosis not present

## 2020-02-08 ENCOUNTER — Other Ambulatory Visit: Payer: Self-pay | Admitting: Internal Medicine

## 2020-02-09 DIAGNOSIS — J3089 Other allergic rhinitis: Secondary | ICD-10-CM | POA: Diagnosis not present

## 2020-02-09 DIAGNOSIS — J3081 Allergic rhinitis due to animal (cat) (dog) hair and dander: Secondary | ICD-10-CM | POA: Diagnosis not present

## 2020-02-09 DIAGNOSIS — J301 Allergic rhinitis due to pollen: Secondary | ICD-10-CM | POA: Diagnosis not present

## 2020-02-12 DIAGNOSIS — Z23 Encounter for immunization: Secondary | ICD-10-CM | POA: Diagnosis not present

## 2020-02-12 DIAGNOSIS — D2272 Melanocytic nevi of left lower limb, including hip: Secondary | ICD-10-CM | POA: Diagnosis not present

## 2020-02-12 DIAGNOSIS — L578 Other skin changes due to chronic exposure to nonionizing radiation: Secondary | ICD-10-CM | POA: Diagnosis not present

## 2020-02-12 DIAGNOSIS — J3089 Other allergic rhinitis: Secondary | ICD-10-CM | POA: Diagnosis not present

## 2020-02-12 DIAGNOSIS — L570 Actinic keratosis: Secondary | ICD-10-CM | POA: Diagnosis not present

## 2020-02-12 DIAGNOSIS — J3081 Allergic rhinitis due to animal (cat) (dog) hair and dander: Secondary | ICD-10-CM | POA: Diagnosis not present

## 2020-02-12 DIAGNOSIS — L821 Other seborrheic keratosis: Secondary | ICD-10-CM | POA: Diagnosis not present

## 2020-02-12 DIAGNOSIS — D225 Melanocytic nevi of trunk: Secondary | ICD-10-CM | POA: Diagnosis not present

## 2020-02-12 DIAGNOSIS — Z85828 Personal history of other malignant neoplasm of skin: Secondary | ICD-10-CM | POA: Diagnosis not present

## 2020-02-12 DIAGNOSIS — J301 Allergic rhinitis due to pollen: Secondary | ICD-10-CM | POA: Diagnosis not present

## 2020-02-13 ENCOUNTER — Other Ambulatory Visit: Payer: Self-pay

## 2020-02-13 MED ORDER — METFORMIN HCL 500 MG PO TABS: ORAL_TABLET | ORAL | 3 refills | Status: DC

## 2020-02-19 DIAGNOSIS — J301 Allergic rhinitis due to pollen: Secondary | ICD-10-CM | POA: Diagnosis not present

## 2020-02-19 DIAGNOSIS — J3089 Other allergic rhinitis: Secondary | ICD-10-CM | POA: Diagnosis not present

## 2020-02-19 DIAGNOSIS — J3081 Allergic rhinitis due to animal (cat) (dog) hair and dander: Secondary | ICD-10-CM | POA: Diagnosis not present

## 2020-02-23 DIAGNOSIS — J3089 Other allergic rhinitis: Secondary | ICD-10-CM | POA: Diagnosis not present

## 2020-02-23 DIAGNOSIS — J301 Allergic rhinitis due to pollen: Secondary | ICD-10-CM | POA: Diagnosis not present

## 2020-02-23 DIAGNOSIS — J3081 Allergic rhinitis due to animal (cat) (dog) hair and dander: Secondary | ICD-10-CM | POA: Diagnosis not present

## 2020-03-01 DIAGNOSIS — J3081 Allergic rhinitis due to animal (cat) (dog) hair and dander: Secondary | ICD-10-CM | POA: Diagnosis not present

## 2020-03-01 DIAGNOSIS — J3089 Other allergic rhinitis: Secondary | ICD-10-CM | POA: Diagnosis not present

## 2020-03-01 DIAGNOSIS — J301 Allergic rhinitis due to pollen: Secondary | ICD-10-CM | POA: Diagnosis not present

## 2020-03-08 ENCOUNTER — Other Ambulatory Visit (INDEPENDENT_AMBULATORY_CARE_PROVIDER_SITE_OTHER): Payer: Medicare Other

## 2020-03-08 ENCOUNTER — Other Ambulatory Visit: Payer: Self-pay

## 2020-03-08 DIAGNOSIS — Z125 Encounter for screening for malignant neoplasm of prostate: Secondary | ICD-10-CM

## 2020-03-08 DIAGNOSIS — E119 Type 2 diabetes mellitus without complications: Secondary | ICD-10-CM

## 2020-03-08 DIAGNOSIS — Z0000 Encounter for general adult medical examination without abnormal findings: Secondary | ICD-10-CM | POA: Diagnosis not present

## 2020-03-08 DIAGNOSIS — J3089 Other allergic rhinitis: Secondary | ICD-10-CM | POA: Diagnosis not present

## 2020-03-08 DIAGNOSIS — J301 Allergic rhinitis due to pollen: Secondary | ICD-10-CM | POA: Diagnosis not present

## 2020-03-08 DIAGNOSIS — R030 Elevated blood-pressure reading, without diagnosis of hypertension: Secondary | ICD-10-CM

## 2020-03-08 DIAGNOSIS — E785 Hyperlipidemia, unspecified: Secondary | ICD-10-CM | POA: Diagnosis not present

## 2020-03-08 DIAGNOSIS — J3081 Allergic rhinitis due to animal (cat) (dog) hair and dander: Secondary | ICD-10-CM | POA: Diagnosis not present

## 2020-03-08 LAB — BASIC METABOLIC PANEL
BUN: 26 mg/dL — ABNORMAL HIGH (ref 6–23)
CO2: 26 mEq/L (ref 19–32)
Calcium: 9.1 mg/dL (ref 8.4–10.5)
Chloride: 105 mEq/L (ref 96–112)
Creatinine, Ser: 1.01 mg/dL (ref 0.40–1.50)
GFR: 73.73 mL/min (ref >60.00)
Glucose, Bld: 131 mg/dL — ABNORMAL HIGH (ref 70–99)
Potassium: 4.4 mEq/L (ref 3.5–5.1)
Sodium: 140 mEq/L (ref 135–145)

## 2020-03-08 LAB — LIPID PANEL
Cholesterol: 142 mg/dL (ref 0–200)
HDL: 48.3 mg/dL (ref >39.00)
LDL Cholesterol: 78 mg/dL (ref 0–99)
NonHDL: 93.66
Total CHOL/HDL Ratio: 3
Triglycerides: 76 mg/dL (ref 0.0–149.0)
VLDL: 15.2 mg/dL (ref 0.0–40.0)

## 2020-03-08 LAB — CBC WITH DIFFERENTIAL/PLATELET
Basophils Absolute: 0.1 10*3/uL (ref 0.0–0.1)
Basophils Relative: 0.7 % (ref 0.0–3.0)
Eosinophils Absolute: 0.4 10*3/uL (ref 0.0–0.7)
Eosinophils Relative: 5.7 % — ABNORMAL HIGH (ref 0.0–5.0)
HCT: 40.9 % (ref 39.0–52.0)
Hemoglobin: 13.8 g/dL (ref 13.0–17.0)
Lymphocytes Relative: 37.6 % (ref 12.0–46.0)
Lymphs Abs: 2.8 10*3/uL (ref 0.7–4.0)
MCHC: 33.8 g/dL (ref 30.0–36.0)
MCV: 93.1 fl (ref 78.0–100.0)
Monocytes Absolute: 0.7 10*3/uL (ref 0.1–1.0)
Monocytes Relative: 10.1 % (ref 3.0–12.0)
Neutro Abs: 3.4 10*3/uL (ref 1.4–7.7)
Neutrophils Relative %: 45.9 % (ref 43.0–77.0)
Platelets: 221 10*3/uL (ref 150.0–400.0)
RBC: 4.39 Mil/uL (ref 4.22–5.81)
RDW: 12.7 % (ref 11.5–15.5)
WBC: 7.4 10*3/uL (ref 4.0–10.5)

## 2020-03-08 LAB — PSA: PSA: 0.77 ng/mL (ref 0.10–4.00)

## 2020-03-08 LAB — HEPATIC FUNCTION PANEL
ALT: 19 U/L (ref 0–53)
AST: 18 U/L (ref 0–37)
Albumin: 4.3 g/dL (ref 3.5–5.2)
Alkaline Phosphatase: 60 U/L (ref 39–117)
Bilirubin, Direct: 0.2 mg/dL (ref 0.0–0.3)
Total Bilirubin: 0.7 mg/dL (ref 0.2–1.2)
Total Protein: 6.4 g/dL (ref 6.0–8.3)

## 2020-03-08 LAB — TSH: TSH: 3.76 u[IU]/mL (ref 0.35–4.50)

## 2020-03-08 LAB — HEMOGLOBIN A1C: Hgb A1c MFr Bld: 7.1 % — ABNORMAL HIGH (ref 4.6–6.5)

## 2020-03-10 DIAGNOSIS — J3089 Other allergic rhinitis: Secondary | ICD-10-CM | POA: Diagnosis not present

## 2020-03-10 DIAGNOSIS — J3081 Allergic rhinitis due to animal (cat) (dog) hair and dander: Secondary | ICD-10-CM | POA: Diagnosis not present

## 2020-03-10 DIAGNOSIS — J301 Allergic rhinitis due to pollen: Secondary | ICD-10-CM | POA: Diagnosis not present

## 2020-03-15 DIAGNOSIS — J3081 Allergic rhinitis due to animal (cat) (dog) hair and dander: Secondary | ICD-10-CM | POA: Diagnosis not present

## 2020-03-15 DIAGNOSIS — J301 Allergic rhinitis due to pollen: Secondary | ICD-10-CM | POA: Diagnosis not present

## 2020-03-15 DIAGNOSIS — J3089 Other allergic rhinitis: Secondary | ICD-10-CM | POA: Diagnosis not present

## 2020-03-17 DIAGNOSIS — J3089 Other allergic rhinitis: Secondary | ICD-10-CM | POA: Diagnosis not present

## 2020-03-17 DIAGNOSIS — J3081 Allergic rhinitis due to animal (cat) (dog) hair and dander: Secondary | ICD-10-CM | POA: Diagnosis not present

## 2020-03-17 DIAGNOSIS — J301 Allergic rhinitis due to pollen: Secondary | ICD-10-CM | POA: Diagnosis not present

## 2020-03-18 ENCOUNTER — Ambulatory Visit (INDEPENDENT_AMBULATORY_CARE_PROVIDER_SITE_OTHER): Payer: Medicare Other | Admitting: Internal Medicine

## 2020-03-18 ENCOUNTER — Other Ambulatory Visit: Payer: Self-pay

## 2020-03-18 ENCOUNTER — Encounter: Payer: Self-pay | Admitting: Internal Medicine

## 2020-03-18 VITALS — BP 146/84 | HR 65 | Temp 98.4°F | Ht 70.0 in | Wt 203.0 lb

## 2020-03-18 DIAGNOSIS — K219 Gastro-esophageal reflux disease without esophagitis: Secondary | ICD-10-CM | POA: Diagnosis not present

## 2020-03-18 DIAGNOSIS — J4520 Mild intermittent asthma, uncomplicated: Secondary | ICD-10-CM | POA: Diagnosis not present

## 2020-03-18 DIAGNOSIS — Z0000 Encounter for general adult medical examination without abnormal findings: Secondary | ICD-10-CM

## 2020-03-18 DIAGNOSIS — E119 Type 2 diabetes mellitus without complications: Secondary | ICD-10-CM | POA: Diagnosis not present

## 2020-03-18 DIAGNOSIS — E785 Hyperlipidemia, unspecified: Secondary | ICD-10-CM

## 2020-03-18 NOTE — Progress Notes (Signed)
Subjective:  Patient ID: Brian Ayers, male    DOB: December 15, 1951  Age: 69 y.o. MRN: [id number]  CC: No chief complaint on file.   HPI Brian Ayers presents for a well exam BP ok at home Last EKG 2 y ago F/u DM, HTN  Outpatient Medications Prior to Visit  Medication Sig Dispense Refill  . ADVAIR DISKUS 100-50 MCG/DOSE AEPB INHALE 1 PUFF INTO THE LUNGS TWO TIMES DAILY 180 each 3  . aspirin 81 MG chewable tablet Chew 81 mg by mouth daily.    Marland Kitchen azelastine (ASTELIN) 0.1 % nasal spray USE 1 SPRAY INTO EACH NOSTRIL TWICE A DAY    . Biotin 2500 MCG CAPS Take 2,500 mcg by mouth daily.     Marland Kitchen EPINEPHrine 0.3 mg/0.3 mL IJ SOAJ injection as needed.  0  . famotidine (PEPCID) 20 MG tablet TAKE 1 TABLET BY MOUTH TWICE A DAY 180 tablet 1  . fluticasone (FLONASE) 50 MCG/ACT nasal spray SPRAY 1 SPRAY INTO EACH NOSTRIL EVERY DAY    . glucose blood (ONETOUCH VERIO) test strip USE TO CHECK BLOOD SUGAR TWICE A DAY DX E11.9 300 each 1  . loratadine (CLARITIN) 10 MG tablet TAKE 1 TABLET (10 MG TOTAL) BY MOUTH DAILY. 90 tablet 3  . metFORMIN (GLUCOPHAGE) 500 MG tablet 1000 mg AM, 500 mg PM 270 tablet 3  . OneTouch Delica Lancets 99991111 MISC USE TO CHECK BS TWICE A DAY 100 each 5  . OVER THE COUNTER MEDICATION Saw Palmetto 450 mg, twice daily.    Marland Kitchen OVER THE COUNTER MEDICATION Vitamin D 3 One capsule daily.    Marland Kitchen PROAIR HFA 108 (90 BASE) MCG/ACT inhaler as needed.  0  . rosuvastatin (CRESTOR) 10 MG tablet Take 1 tablet (10 mg total) by mouth daily. 90 tablet 3  . UNABLE TO FIND Med Name: Takes allergy shots weekly    . valsartan (DIOVAN) 160 MG tablet Take 1 tablet (160 mg total) by mouth daily. 30 tablet 11  . sildenafil (VIAGRA) 100 MG tablet Take 1 tablet (100 mg total) by mouth as needed for erectile dysfunction. 12 tablet 11   No facility-administered medications prior to visit.    ROS: Review of Systems  Constitutional: Negative for appetite change, fatigue and unexpected weight change.  HENT:  Negative for congestion, nosebleeds, sneezing, sore throat and trouble swallowing.   Eyes: Negative for itching and visual disturbance.  Respiratory: Negative for cough.   Cardiovascular: Negative for chest pain, palpitations and leg swelling.  Gastrointestinal: Negative for abdominal distention, blood in stool, diarrhea and nausea.  Genitourinary: Negative for frequency and hematuria.  Musculoskeletal: Negative for back pain, gait problem, joint swelling and neck pain.  Skin: Negative for rash.  Neurological: Negative for dizziness, tremors, speech difficulty and weakness.  Psychiatric/Behavioral: Negative for agitation, dysphoric mood, sleep disturbance and suicidal ideas. The patient is not nervous/anxious.     Objective:  BP (!) 146/84 (BP Location: Left Arm, Patient Position: Sitting, Cuff Size: Large)   Pulse 65   Temp 98.4 F (36.9 C) (Oral)   Ht 5\' 10"  (1.778 m)   Wt 203 lb (92.1 kg)   SpO2 97%   BMI 29.13 kg/m   BP Readings from Last 3 Encounters:  03/18/20 (!) 146/84  11/18/19 (!) 154/92  07/15/19 (!) 154/86    Wt Readings from Last 3 Encounters:  03/18/20 203 lb (92.1 kg)  11/18/19 209 lb (94.8 kg)  07/15/19 204 lb (92.5 kg)    Physical Exam  Constitutional:      General: He is not in acute distress.    Appearance: He is well-developed.     Comments: NAD  Eyes:     Conjunctiva/sclera: Conjunctivae normal.     Pupils: Pupils are equal, round, and reactive to light.  Neck:     Thyroid: No thyromegaly.     Vascular: No JVD.  Cardiovascular:     Rate and Rhythm: Normal rate and regular rhythm.     Heart sounds: Normal heart sounds. No murmur. No friction rub. No gallop.   Pulmonary:     Effort: Pulmonary effort is normal. No respiratory distress.     Breath sounds: Normal breath sounds. No wheezing or rales.  Chest:     Chest wall: No tenderness.  Abdominal:     General: Bowel sounds are normal. There is no distension.     Palpations: Abdomen is soft.  There is no mass.     Tenderness: There is no abdominal tenderness. There is no guarding or rebound.  Musculoskeletal:        General: No tenderness. Normal range of motion.     Cervical back: Normal range of motion.  Lymphadenopathy:     Cervical: No cervical adenopathy.  Skin:    General: Skin is warm and dry.     Findings: No rash.  Neurological:     Mental Status: He is alert and oriented to person, place, and time.     Cranial Nerves: No cranial nerve deficit.     Motor: No abnormal muscle tone.     Coordination: Coordination normal.     Gait: Gait normal.     Deep Tendon Reflexes: Reflexes are normal and symmetric.  Psychiatric:        Behavior: Behavior normal.        Thought Content: Thought content normal.        Judgment: Judgment normal.   pt declined rectal  Lab Results  Component Value Date   WBC 7.4 03/08/2020   HGB 13.8 03/08/2020   HCT 40.9 03/08/2020   PLT 221.0 03/08/2020   GLUCOSE 131 (H) 03/08/2020   CHOL 142 03/08/2020   TRIG 76.0 03/08/2020   HDL 48.30 03/08/2020   LDLCALC 78 03/08/2020   ALT 19 03/08/2020   AST 18 03/08/2020   NA 140 03/08/2020   K 4.4 03/08/2020   CL 105 03/08/2020   CREATININE 1.01 03/08/2020   BUN 26 (H) 03/08/2020   CO2 26 03/08/2020   TSH 3.76 03/08/2020   PSA 0.77 03/08/2020   HGBA1C 7.1 (H) 03/08/2020   MICROALBUR <0.7 03/25/2018    CT CARDIAC SCORING  Addendum Date: 11/07/2018   ADDENDUM REPORT: 11/07/2018 17:47 CLINICAL DATA:  Risk stratification EXAM: Coronary Calcium Score TECHNIQUE: The patient was scanned on a [employer] scanner. Axial non-contrast 3 mm slices were carried out through the heart. The data set was analyzed on a dedicated work station and scored using the [place]. FINDINGS: Non-cardiac: See separate report from [facility]. Ascending Aorta: Normal size, mild diffuse calcifications in the aortic arch and descending aorta. Pericardium: Normal. Coronary arteries: Normal origin.  IMPRESSION: Coronary calcium score of 76. This was 43 percentile for age and sex matched control. Electronically Signed   By: Ena Dawley   On: 11/07/2018 17:47   Result Date: 11/07/2018 EXAM: OVER-READ INTERPRETATION  CT CHEST The following report is an over-read performed by radiologist Dr. Rolm Baptise of [facility] on 11/07/2018. This over-read does not  include interpretation of cardiac or coronary anatomy or pathology. The coronary calcium score interpretation by the cardiologist is attached. COMPARISON:  None FINDINGS: Vascular: Heart is normal size.  Aorta is normal caliber. Mediastinum/Nodes: No adenopathy in the visualized lower mediastinum or hila. Lungs/Pleura: Visualized lungs clear.  No effusions. Upper Abdomen: Imaging into the upper abdomen shows no acute findings. Musculoskeletal: Chest wall soft tissues are unremarkable. No acute bony abnormality. IMPRESSION: No acute or significant extracardiac abnormality. Electronically Signed: By: Rolm Baptise M.D. On: 11/07/2018 16:51    Assessment & Plan:    Follow-up: No follow-ups on file.  Walker Kehr, MD

## 2020-03-18 NOTE — Assessment & Plan Note (Signed)
Pepcid?

## 2020-03-18 NOTE — Assessment & Plan Note (Signed)
Advair Proventil prn 

## 2020-03-18 NOTE — Assessment & Plan Note (Signed)
Metformin 

## 2020-03-18 NOTE — Assessment & Plan Note (Signed)
Crestor 10mg

## 2020-03-22 DIAGNOSIS — J3089 Other allergic rhinitis: Secondary | ICD-10-CM | POA: Diagnosis not present

## 2020-03-22 DIAGNOSIS — J301 Allergic rhinitis due to pollen: Secondary | ICD-10-CM | POA: Diagnosis not present

## 2020-03-22 DIAGNOSIS — J3081 Allergic rhinitis due to animal (cat) (dog) hair and dander: Secondary | ICD-10-CM | POA: Diagnosis not present

## 2020-03-29 DIAGNOSIS — J3081 Allergic rhinitis due to animal (cat) (dog) hair and dander: Secondary | ICD-10-CM | POA: Diagnosis not present

## 2020-03-29 DIAGNOSIS — J301 Allergic rhinitis due to pollen: Secondary | ICD-10-CM | POA: Diagnosis not present

## 2020-03-29 DIAGNOSIS — J3089 Other allergic rhinitis: Secondary | ICD-10-CM | POA: Diagnosis not present

## 2020-04-05 DIAGNOSIS — J3089 Other allergic rhinitis: Secondary | ICD-10-CM | POA: Diagnosis not present

## 2020-04-05 DIAGNOSIS — J301 Allergic rhinitis due to pollen: Secondary | ICD-10-CM | POA: Diagnosis not present

## 2020-04-05 DIAGNOSIS — J3081 Allergic rhinitis due to animal (cat) (dog) hair and dander: Secondary | ICD-10-CM | POA: Diagnosis not present

## 2020-04-12 DIAGNOSIS — J3081 Allergic rhinitis due to animal (cat) (dog) hair and dander: Secondary | ICD-10-CM | POA: Diagnosis not present

## 2020-04-12 DIAGNOSIS — J3089 Other allergic rhinitis: Secondary | ICD-10-CM | POA: Diagnosis not present

## 2020-04-12 DIAGNOSIS — J301 Allergic rhinitis due to pollen: Secondary | ICD-10-CM | POA: Diagnosis not present

## 2020-04-14 NOTE — Assessment & Plan Note (Signed)

## 2020-04-19 DIAGNOSIS — J301 Allergic rhinitis due to pollen: Secondary | ICD-10-CM | POA: Diagnosis not present

## 2020-04-19 DIAGNOSIS — J3081 Allergic rhinitis due to animal (cat) (dog) hair and dander: Secondary | ICD-10-CM | POA: Diagnosis not present

## 2020-04-19 DIAGNOSIS — J3089 Other allergic rhinitis: Secondary | ICD-10-CM | POA: Diagnosis not present

## 2020-04-26 DIAGNOSIS — J3081 Allergic rhinitis due to animal (cat) (dog) hair and dander: Secondary | ICD-10-CM | POA: Diagnosis not present

## 2020-04-26 DIAGNOSIS — J3089 Other allergic rhinitis: Secondary | ICD-10-CM | POA: Diagnosis not present

## 2020-04-26 DIAGNOSIS — J301 Allergic rhinitis due to pollen: Secondary | ICD-10-CM | POA: Diagnosis not present

## 2020-05-03 DIAGNOSIS — J3089 Other allergic rhinitis: Secondary | ICD-10-CM | POA: Diagnosis not present

## 2020-05-03 DIAGNOSIS — J3081 Allergic rhinitis due to animal (cat) (dog) hair and dander: Secondary | ICD-10-CM | POA: Diagnosis not present

## 2020-05-03 DIAGNOSIS — J301 Allergic rhinitis due to pollen: Secondary | ICD-10-CM | POA: Diagnosis not present

## 2020-05-10 DIAGNOSIS — J3089 Other allergic rhinitis: Secondary | ICD-10-CM | POA: Diagnosis not present

## 2020-05-10 DIAGNOSIS — J3081 Allergic rhinitis due to animal (cat) (dog) hair and dander: Secondary | ICD-10-CM | POA: Diagnosis not present

## 2020-05-10 DIAGNOSIS — J301 Allergic rhinitis due to pollen: Secondary | ICD-10-CM | POA: Diagnosis not present

## 2020-05-18 DIAGNOSIS — J3089 Other allergic rhinitis: Secondary | ICD-10-CM | POA: Diagnosis not present

## 2020-05-18 DIAGNOSIS — J301 Allergic rhinitis due to pollen: Secondary | ICD-10-CM | POA: Diagnosis not present

## 2020-05-18 DIAGNOSIS — J3081 Allergic rhinitis due to animal (cat) (dog) hair and dander: Secondary | ICD-10-CM | POA: Diagnosis not present

## 2020-05-24 DIAGNOSIS — J3081 Allergic rhinitis due to animal (cat) (dog) hair and dander: Secondary | ICD-10-CM | POA: Diagnosis not present

## 2020-05-24 DIAGNOSIS — J3089 Other allergic rhinitis: Secondary | ICD-10-CM | POA: Diagnosis not present

## 2020-05-24 DIAGNOSIS — J301 Allergic rhinitis due to pollen: Secondary | ICD-10-CM | POA: Diagnosis not present

## 2020-05-31 DIAGNOSIS — J301 Allergic rhinitis due to pollen: Secondary | ICD-10-CM | POA: Diagnosis not present

## 2020-05-31 DIAGNOSIS — J3089 Other allergic rhinitis: Secondary | ICD-10-CM | POA: Diagnosis not present

## 2020-05-31 DIAGNOSIS — J3081 Allergic rhinitis due to animal (cat) (dog) hair and dander: Secondary | ICD-10-CM | POA: Diagnosis not present

## 2020-06-22 ENCOUNTER — Ambulatory Visit: Payer: Medicare Other | Admitting: Internal Medicine

## 2020-06-29 ENCOUNTER — Other Ambulatory Visit: Payer: Self-pay | Admitting: Internal Medicine

## 2020-06-30 DIAGNOSIS — H31012 Macula scars of posterior pole (postinflammatory) (post-traumatic), left eye: Secondary | ICD-10-CM | POA: Diagnosis not present

## 2020-06-30 DIAGNOSIS — Z961 Presence of intraocular lens: Secondary | ICD-10-CM | POA: Diagnosis not present

## 2020-06-30 DIAGNOSIS — J3081 Allergic rhinitis due to animal (cat) (dog) hair and dander: Secondary | ICD-10-CM | POA: Diagnosis not present

## 2020-06-30 DIAGNOSIS — J301 Allergic rhinitis due to pollen: Secondary | ICD-10-CM | POA: Diagnosis not present

## 2020-06-30 DIAGNOSIS — H43812 Vitreous degeneration, left eye: Secondary | ICD-10-CM | POA: Diagnosis not present

## 2020-06-30 DIAGNOSIS — H2511 Age-related nuclear cataract, right eye: Secondary | ICD-10-CM | POA: Diagnosis not present

## 2020-06-30 DIAGNOSIS — J3089 Other allergic rhinitis: Secondary | ICD-10-CM | POA: Diagnosis not present

## 2020-06-30 DIAGNOSIS — H35412 Lattice degeneration of retina, left eye: Secondary | ICD-10-CM | POA: Diagnosis not present

## 2020-06-30 DIAGNOSIS — E119 Type 2 diabetes mellitus without complications: Secondary | ICD-10-CM | POA: Diagnosis not present

## 2020-07-01 ENCOUNTER — Other Ambulatory Visit (INDEPENDENT_AMBULATORY_CARE_PROVIDER_SITE_OTHER): Payer: Medicare Other

## 2020-07-01 DIAGNOSIS — E119 Type 2 diabetes mellitus without complications: Secondary | ICD-10-CM | POA: Diagnosis not present

## 2020-07-01 LAB — BASIC METABOLIC PANEL
BUN: 21 mg/dL (ref 6–23)
CO2: 25 mEq/L (ref 19–32)
Calcium: 9.1 mg/dL (ref 8.4–10.5)
Chloride: 104 mEq/L (ref 96–112)
Creatinine, Ser: 0.99 mg/dL (ref 0.40–1.50)
GFR: 75.38 mL/min (ref >60.00)
Glucose, Bld: 139 mg/dL — ABNORMAL HIGH (ref 70–99)
Potassium: 4.1 mEq/L (ref 3.5–5.1)
Sodium: 139 mEq/L (ref 135–145)

## 2020-07-01 LAB — HEMOGLOBIN A1C: Hgb A1c MFr Bld: 7.2 % — ABNORMAL HIGH (ref 4.6–6.5)

## 2020-07-04 LAB — HM DIABETES EYE EXAM

## 2020-07-05 DIAGNOSIS — J3089 Other allergic rhinitis: Secondary | ICD-10-CM | POA: Diagnosis not present

## 2020-07-05 DIAGNOSIS — J301 Allergic rhinitis due to pollen: Secondary | ICD-10-CM | POA: Diagnosis not present

## 2020-07-05 DIAGNOSIS — J3081 Allergic rhinitis due to animal (cat) (dog) hair and dander: Secondary | ICD-10-CM | POA: Diagnosis not present

## 2020-07-12 DIAGNOSIS — J3089 Other allergic rhinitis: Secondary | ICD-10-CM | POA: Diagnosis not present

## 2020-07-12 DIAGNOSIS — J3081 Allergic rhinitis due to animal (cat) (dog) hair and dander: Secondary | ICD-10-CM | POA: Diagnosis not present

## 2020-07-12 DIAGNOSIS — J301 Allergic rhinitis due to pollen: Secondary | ICD-10-CM | POA: Diagnosis not present

## 2020-07-19 DIAGNOSIS — J3081 Allergic rhinitis due to animal (cat) (dog) hair and dander: Secondary | ICD-10-CM | POA: Diagnosis not present

## 2020-07-19 DIAGNOSIS — J301 Allergic rhinitis due to pollen: Secondary | ICD-10-CM | POA: Diagnosis not present

## 2020-07-19 DIAGNOSIS — J3089 Other allergic rhinitis: Secondary | ICD-10-CM | POA: Diagnosis not present

## 2020-07-20 ENCOUNTER — Encounter: Payer: Self-pay | Admitting: Internal Medicine

## 2020-07-20 ENCOUNTER — Ambulatory Visit (INDEPENDENT_AMBULATORY_CARE_PROVIDER_SITE_OTHER): Payer: Medicare Other | Admitting: Internal Medicine

## 2020-07-20 ENCOUNTER — Other Ambulatory Visit: Payer: Self-pay

## 2020-07-20 DIAGNOSIS — E785 Hyperlipidemia, unspecified: Secondary | ICD-10-CM | POA: Diagnosis not present

## 2020-07-20 DIAGNOSIS — J310 Chronic rhinitis: Secondary | ICD-10-CM | POA: Diagnosis not present

## 2020-07-20 DIAGNOSIS — E119 Type 2 diabetes mellitus without complications: Secondary | ICD-10-CM | POA: Diagnosis not present

## 2020-07-20 DIAGNOSIS — J4520 Mild intermittent asthma, uncomplicated: Secondary | ICD-10-CM

## 2020-07-20 NOTE — Assessment & Plan Note (Signed)
-   Crestor 

## 2020-07-20 NOTE — Progress Notes (Signed)
Subjective:  Patient ID: Brian Ayers, male    DOB: [birth year]  Age: 69 y.o. MRN: [id number]  CC: No chief complaint on file.   HPI Brian Ayers presents for DM, HTN, GERD and asthma f/u  Outpatient Medications Prior to Visit  Medication Sig Dispense Refill  . ADVAIR DISKUS 100-50 MCG/DOSE AEPB INHALE 1 PUFF INTO THE LUNGS TWO TIMES DAILY 180 each 3  . aspirin 81 MG chewable tablet Chew 81 mg by mouth daily.    Marland Kitchen azelastine (ASTELIN) 0.1 % nasal spray USE 1 SPRAY INTO EACH NOSTRIL TWICE A DAY    . Biotin 2500 MCG CAPS Take 2,500 mcg by mouth daily.     Marland Kitchen EPINEPHrine 0.3 mg/0.3 mL IJ SOAJ injection as needed.  0  . famotidine (PEPCID) 20 MG tablet TAKE 1 TABLET BY MOUTH TWICE A DAY 180 tablet 1  . fexofenadine (ALLEGRA) 180 MG tablet Take 180 mg by mouth daily.    . fluticasone (FLONASE) 50 MCG/ACT nasal spray SPRAY 1 SPRAY INTO EACH NOSTRIL EVERY DAY    . metFORMIN (GLUCOPHAGE) 500 MG tablet 1000 mg AM, 500 mg PM 270 tablet 3  . OneTouch Delica Lancets 53I MISC USE TO CHECK BS TWICE A DAY 100 each 5  . ONETOUCH VERIO test strip USE TO CHECK BLOOD SUGAR TWICE A DAY DX E11.9 200 strip 2  . OVER THE COUNTER MEDICATION Saw Palmetto 450 mg, twice daily.    Marland Kitchen OVER THE COUNTER MEDICATION Vitamin D 3 One capsule daily.    Marland Kitchen PROAIR HFA 108 (90 BASE) MCG/ACT inhaler as needed.  0  . rosuvastatin (CRESTOR) 10 MG tablet Take 1 tablet (10 mg total) by mouth daily. 90 tablet 3  . UNABLE TO FIND Med Name: Takes allergy shots weekly    . valsartan (DIOVAN) 160 MG tablet Take 1 tablet (160 mg total) by mouth daily. 30 tablet 11  . sildenafil (VIAGRA) 100 MG tablet Take 1 tablet (100 mg total) by mouth as needed for erectile dysfunction. 12 tablet 11  . loratadine (CLARITIN) 10 MG tablet TAKE 1 TABLET (10 MG TOTAL) BY MOUTH DAILY. 90 tablet 3   No facility-administered medications prior to visit.    ROS: Review of Systems  Constitutional: Negative for appetite change, fatigue and unexpected  weight change.  HENT: Negative for congestion, nosebleeds, sneezing, sore throat and trouble swallowing.   Eyes: Negative for itching and visual disturbance.  Respiratory: Negative for cough.   Cardiovascular: Negative for chest pain, palpitations and leg swelling.  Gastrointestinal: Negative for abdominal distention, blood in stool, diarrhea and nausea.  Genitourinary: Negative for frequency and hematuria.  Musculoskeletal: Negative for back pain, gait problem, joint swelling and neck pain.  Skin: Negative for rash.  Neurological: Negative for dizziness, tremors, speech difficulty and weakness.  Psychiatric/Behavioral: Negative for agitation, dysphoric mood and sleep disturbance. The patient is not nervous/anxious.     Objective:  BP 140/80 (BP Location: Right Arm, Patient Position: Sitting, Cuff Size: Large)   Pulse 65   Temp 98.1 F (36.7 C) (Oral)   Ht 5\' 10"  (1.778 m)   Wt 207 lb (93.9 kg)   SpO2 99%   BMI 29.70 kg/m   BP Readings from Last 3 Encounters:  07/20/20 140/80  03/18/20 (!) 146/84  11/18/19 (!) 154/92    Wt Readings from Last 3 Encounters:  07/20/20 207 lb (93.9 kg)  03/18/20 203 lb (92.1 kg)  11/18/19 209 lb (94.8 kg)    Physical Exam  Constitutional:      General: He is not in acute distress.    Appearance: He is well-developed.     Comments: NAD  Eyes:     Conjunctiva/sclera: Conjunctivae normal.     Pupils: Pupils are equal, round, and reactive to light.  Neck:     Thyroid: No thyromegaly.     Vascular: No JVD.  Cardiovascular:     Rate and Rhythm: Normal rate and regular rhythm.     Heart sounds: Normal heart sounds. No murmur heard.  No friction rub. No gallop.   Pulmonary:     Effort: Pulmonary effort is normal. No respiratory distress.     Breath sounds: Normal breath sounds. No wheezing or rales.  Chest:     Chest wall: No tenderness.  Abdominal:     General: Bowel sounds are normal. There is no distension.     Palpations: Abdomen is  soft. There is no mass.     Tenderness: There is no abdominal tenderness. There is no guarding or rebound.  Musculoskeletal:        General: No tenderness. Normal range of motion.     Cervical back: Normal range of motion.  Lymphadenopathy:     Cervical: No cervical adenopathy.  Skin:    General: Skin is warm and dry.     Findings: No rash.  Neurological:     Mental Status: He is alert and oriented to person, place, and time.     Cranial Nerves: No cranial nerve deficit.     Motor: No abnormal muscle tone.     Coordination: Coordination normal.     Gait: Gait normal.     Deep Tendon Reflexes: Reflexes are normal and symmetric.  Psychiatric:        Behavior: Behavior normal.        Thought Content: Thought content normal.        Judgment: Judgment normal.     Lab Results  Component Value Date   WBC 7.4 03/08/2020   HGB 13.8 03/08/2020   HCT 40.9 03/08/2020   PLT 221.0 03/08/2020   GLUCOSE 139 (H) 07/01/2020   CHOL 142 03/08/2020   TRIG 76.0 03/08/2020   HDL 48.30 03/08/2020   LDLCALC 78 03/08/2020   ALT 19 03/08/2020   AST 18 03/08/2020   NA 139 07/01/2020   K 4.1 07/01/2020   CL 104 07/01/2020   CREATININE 0.99 07/01/2020   BUN 21 07/01/2020   CO2 25 07/01/2020   TSH 3.76 03/08/2020   PSA 0.77 03/08/2020   HGBA1C 7.2 (H) 07/01/2020   MICROALBUR <0.7 03/25/2018    CT CARDIAC SCORING  Addendum Date: 11/07/2018   ADDENDUM REPORT: 11/07/2018 17:47 CLINICAL DATA:  Risk stratification EXAM: Coronary Calcium Score TECHNIQUE: The patient was scanned on a [employer] scanner. Axial non-contrast 3 mm slices were carried out through the heart. The data set was analyzed on a dedicated work station and scored using the [place]. FINDINGS: Non-cardiac: See separate report from [facility]. Ascending Aorta: Normal size, mild diffuse calcifications in the aortic arch and descending aorta. Pericardium: Normal. Coronary arteries: Normal origin. IMPRESSION: Coronary  calcium score of 76. This was 66 percentile for age and sex matched control. Electronically Signed   By: Ena Dawley   On: 11/07/2018 17:47   Result Date: 11/07/2018 EXAM: OVER-READ INTERPRETATION  CT CHEST The following report is an over-read performed by radiologist Dr. Rolm Baptise of [facility] on 11/07/2018. This over-read does not include  interpretation of cardiac or coronary anatomy or pathology. The coronary calcium score interpretation by the cardiologist is attached. COMPARISON:  None FINDINGS: Vascular: Heart is normal size.  Aorta is normal caliber. Mediastinum/Nodes: No adenopathy in the visualized lower mediastinum or hila. Lungs/Pleura: Visualized lungs clear.  No effusions. Upper Abdomen: Imaging into the upper abdomen shows no acute findings. Musculoskeletal: Chest wall soft tissues are unremarkable. No acute bony abnormality. IMPRESSION: No acute or significant extracardiac abnormality. Electronically Signed: By: Rolm Baptise M.D. On: 11/07/2018 16:51    Assessment & Plan:    Walker Kehr, MD

## 2020-07-20 NOTE — Assessment & Plan Note (Signed)
Metformin 

## 2020-07-20 NOTE — Assessment & Plan Note (Addendum)
Claritin  Flonase

## 2020-07-20 NOTE — Assessment & Plan Note (Signed)
Advair Proventil prn

## 2020-07-26 DIAGNOSIS — J301 Allergic rhinitis due to pollen: Secondary | ICD-10-CM | POA: Diagnosis not present

## 2020-07-26 DIAGNOSIS — J3081 Allergic rhinitis due to animal (cat) (dog) hair and dander: Secondary | ICD-10-CM | POA: Diagnosis not present

## 2020-07-26 DIAGNOSIS — J3089 Other allergic rhinitis: Secondary | ICD-10-CM | POA: Diagnosis not present

## 2020-07-28 ENCOUNTER — Other Ambulatory Visit: Payer: Self-pay | Admitting: Internal Medicine

## 2020-07-28 DIAGNOSIS — J3089 Other allergic rhinitis: Secondary | ICD-10-CM | POA: Diagnosis not present

## 2020-07-28 DIAGNOSIS — J3081 Allergic rhinitis due to animal (cat) (dog) hair and dander: Secondary | ICD-10-CM | POA: Diagnosis not present

## 2020-07-28 DIAGNOSIS — J301 Allergic rhinitis due to pollen: Secondary | ICD-10-CM | POA: Diagnosis not present

## 2020-08-02 DIAGNOSIS — J3089 Other allergic rhinitis: Secondary | ICD-10-CM | POA: Diagnosis not present

## 2020-08-02 DIAGNOSIS — J3081 Allergic rhinitis due to animal (cat) (dog) hair and dander: Secondary | ICD-10-CM | POA: Diagnosis not present

## 2020-08-02 DIAGNOSIS — J301 Allergic rhinitis due to pollen: Secondary | ICD-10-CM | POA: Diagnosis not present

## 2020-08-05 ENCOUNTER — Institutional Professional Consult (permissible substitution): Payer: Medicare Other | Admitting: Internal Medicine

## 2020-08-09 DIAGNOSIS — J3081 Allergic rhinitis due to animal (cat) (dog) hair and dander: Secondary | ICD-10-CM | POA: Diagnosis not present

## 2020-08-09 DIAGNOSIS — J301 Allergic rhinitis due to pollen: Secondary | ICD-10-CM | POA: Diagnosis not present

## 2020-08-09 DIAGNOSIS — J3089 Other allergic rhinitis: Secondary | ICD-10-CM | POA: Diagnosis not present

## 2020-08-11 DIAGNOSIS — J3089 Other allergic rhinitis: Secondary | ICD-10-CM | POA: Diagnosis not present

## 2020-08-11 DIAGNOSIS — J301 Allergic rhinitis due to pollen: Secondary | ICD-10-CM | POA: Diagnosis not present

## 2020-08-11 DIAGNOSIS — J3081 Allergic rhinitis due to animal (cat) (dog) hair and dander: Secondary | ICD-10-CM | POA: Diagnosis not present

## 2020-08-13 ENCOUNTER — Other Ambulatory Visit: Payer: Self-pay

## 2020-08-13 MED ORDER — ONETOUCH VERIO VI STRP: ORAL_STRIP | 2 refills | Status: DC

## 2020-08-13 NOTE — Progress Notes (Signed)
Medicare only allows for 100 test strips to be prescribed. Switched Rx to 100 from 200

## 2020-08-16 DIAGNOSIS — J301 Allergic rhinitis due to pollen: Secondary | ICD-10-CM | POA: Diagnosis not present

## 2020-08-16 DIAGNOSIS — J3089 Other allergic rhinitis: Secondary | ICD-10-CM | POA: Diagnosis not present

## 2020-08-16 DIAGNOSIS — J3081 Allergic rhinitis due to animal (cat) (dog) hair and dander: Secondary | ICD-10-CM | POA: Diagnosis not present

## 2020-08-24 DIAGNOSIS — J3089 Other allergic rhinitis: Secondary | ICD-10-CM | POA: Diagnosis not present

## 2020-08-24 DIAGNOSIS — J3081 Allergic rhinitis due to animal (cat) (dog) hair and dander: Secondary | ICD-10-CM | POA: Diagnosis not present

## 2020-08-24 DIAGNOSIS — J301 Allergic rhinitis due to pollen: Secondary | ICD-10-CM | POA: Diagnosis not present

## 2020-08-30 DIAGNOSIS — J301 Allergic rhinitis due to pollen: Secondary | ICD-10-CM | POA: Diagnosis not present

## 2020-08-30 DIAGNOSIS — J3089 Other allergic rhinitis: Secondary | ICD-10-CM | POA: Diagnosis not present

## 2020-08-30 DIAGNOSIS — J3081 Allergic rhinitis due to animal (cat) (dog) hair and dander: Secondary | ICD-10-CM | POA: Diagnosis not present

## 2020-09-06 DIAGNOSIS — J3089 Other allergic rhinitis: Secondary | ICD-10-CM | POA: Diagnosis not present

## 2020-09-06 DIAGNOSIS — J301 Allergic rhinitis due to pollen: Secondary | ICD-10-CM | POA: Diagnosis not present

## 2020-09-06 DIAGNOSIS — J3081 Allergic rhinitis due to animal (cat) (dog) hair and dander: Secondary | ICD-10-CM | POA: Diagnosis not present

## 2020-09-13 DIAGNOSIS — J301 Allergic rhinitis due to pollen: Secondary | ICD-10-CM | POA: Diagnosis not present

## 2020-09-13 DIAGNOSIS — J3081 Allergic rhinitis due to animal (cat) (dog) hair and dander: Secondary | ICD-10-CM | POA: Diagnosis not present

## 2020-09-13 DIAGNOSIS — J3089 Other allergic rhinitis: Secondary | ICD-10-CM | POA: Diagnosis not present

## 2020-09-20 DIAGNOSIS — J3089 Other allergic rhinitis: Secondary | ICD-10-CM | POA: Diagnosis not present

## 2020-09-20 DIAGNOSIS — J3081 Allergic rhinitis due to animal (cat) (dog) hair and dander: Secondary | ICD-10-CM | POA: Diagnosis not present

## 2020-09-20 DIAGNOSIS — J301 Allergic rhinitis due to pollen: Secondary | ICD-10-CM | POA: Diagnosis not present

## 2020-09-27 ENCOUNTER — Ambulatory Visit: Payer: Self-pay

## 2020-09-27 DIAGNOSIS — J3089 Other allergic rhinitis: Secondary | ICD-10-CM | POA: Diagnosis not present

## 2020-09-27 DIAGNOSIS — J3081 Allergic rhinitis due to animal (cat) (dog) hair and dander: Secondary | ICD-10-CM | POA: Diagnosis not present

## 2020-09-27 DIAGNOSIS — J301 Allergic rhinitis due to pollen: Secondary | ICD-10-CM | POA: Diagnosis not present

## 2020-10-04 DIAGNOSIS — J3089 Other allergic rhinitis: Secondary | ICD-10-CM | POA: Diagnosis not present

## 2020-10-04 DIAGNOSIS — J301 Allergic rhinitis due to pollen: Secondary | ICD-10-CM | POA: Diagnosis not present

## 2020-10-04 DIAGNOSIS — J3081 Allergic rhinitis due to animal (cat) (dog) hair and dander: Secondary | ICD-10-CM | POA: Diagnosis not present

## 2020-10-06 ENCOUNTER — Encounter: Payer: Self-pay | Admitting: Internal Medicine

## 2020-10-06 ENCOUNTER — Other Ambulatory Visit: Payer: Self-pay

## 2020-10-06 ENCOUNTER — Ambulatory Visit (INDEPENDENT_AMBULATORY_CARE_PROVIDER_SITE_OTHER): Payer: Medicare Other | Admitting: *Deleted

## 2020-10-06 DIAGNOSIS — Z23 Encounter for immunization: Secondary | ICD-10-CM

## 2020-10-11 ENCOUNTER — Encounter: Payer: Self-pay | Admitting: Internal Medicine

## 2020-10-11 DIAGNOSIS — J301 Allergic rhinitis due to pollen: Secondary | ICD-10-CM | POA: Diagnosis not present

## 2020-10-11 DIAGNOSIS — J3089 Other allergic rhinitis: Secondary | ICD-10-CM | POA: Diagnosis not present

## 2020-10-11 DIAGNOSIS — J3081 Allergic rhinitis due to animal (cat) (dog) hair and dander: Secondary | ICD-10-CM | POA: Diagnosis not present

## 2020-10-15 ENCOUNTER — Other Ambulatory Visit (INDEPENDENT_AMBULATORY_CARE_PROVIDER_SITE_OTHER): Payer: Medicare Other

## 2020-10-15 DIAGNOSIS — E119 Type 2 diabetes mellitus without complications: Secondary | ICD-10-CM

## 2020-10-15 LAB — BASIC METABOLIC PANEL
BUN: 20 mg/dL (ref 6–23)
CO2: 25 mEq/L (ref 19–32)
Calcium: 9 mg/dL (ref 8.4–10.5)
Chloride: 103 mEq/L (ref 96–112)
Creatinine, Ser: 0.94 mg/dL (ref 0.40–1.50)
GFR: 83.96 mL/min (ref >60.00)
Glucose, Bld: 136 mg/dL — ABNORMAL HIGH (ref 70–99)
Potassium: 4.1 mEq/L (ref 3.5–5.1)
Sodium: 138 mEq/L (ref 135–145)

## 2020-10-15 LAB — HEMOGLOBIN A1C: Hgb A1c MFr Bld: 7.2 % — ABNORMAL HIGH (ref 4.6–6.5)

## 2020-10-16 ENCOUNTER — Ambulatory Visit: Payer: Medicare Other

## 2020-10-18 DIAGNOSIS — J3089 Other allergic rhinitis: Secondary | ICD-10-CM | POA: Diagnosis not present

## 2020-10-18 DIAGNOSIS — J3081 Allergic rhinitis due to animal (cat) (dog) hair and dander: Secondary | ICD-10-CM | POA: Diagnosis not present

## 2020-10-18 DIAGNOSIS — J301 Allergic rhinitis due to pollen: Secondary | ICD-10-CM | POA: Diagnosis not present

## 2020-10-20 ENCOUNTER — Ambulatory Visit: Payer: Medicare Other | Admitting: Internal Medicine

## 2020-10-21 ENCOUNTER — Other Ambulatory Visit: Payer: Self-pay

## 2020-10-21 ENCOUNTER — Encounter: Payer: Self-pay | Admitting: Internal Medicine

## 2020-10-21 ENCOUNTER — Ambulatory Visit (INDEPENDENT_AMBULATORY_CARE_PROVIDER_SITE_OTHER): Payer: Medicare Other | Admitting: Internal Medicine

## 2020-10-21 DIAGNOSIS — E119 Type 2 diabetes mellitus without complications: Secondary | ICD-10-CM

## 2020-10-21 DIAGNOSIS — E785 Hyperlipidemia, unspecified: Secondary | ICD-10-CM

## 2020-10-21 DIAGNOSIS — J4520 Mild intermittent asthma, uncomplicated: Secondary | ICD-10-CM

## 2020-10-21 MED ORDER — ONETOUCH VERIO FLEX SYSTEM W/DEVICE KIT: PACK | 0 refills | Status: DC

## 2020-10-21 MED ORDER — SILDENAFIL CITRATE 100 MG PO TABS: 100.0000 mg | ORAL_TABLET | ORAL | 11 refills | Status: DC | PRN

## 2020-10-21 MED ORDER — ONETOUCH DELICA LANCETS 30G MISC
5 refills | Status: DC
Start: 2020-10-21 — End: 2020-12-08

## 2020-10-21 MED ORDER — ONETOUCH VERIO VI STRP
ORAL_STRIP | 5 refills | Status: DC
Start: 2020-10-21 — End: 2020-12-08

## 2020-10-21 NOTE — Addendum Note (Signed)
Addended by: Cassandria Anger on: 10/21/2020 11:44 AM   Modules accepted: Orders

## 2020-10-21 NOTE — Assessment & Plan Note (Signed)
Proventil prn 

## 2020-10-21 NOTE — Assessment & Plan Note (Signed)
On Metformin 

## 2020-10-21 NOTE — Progress Notes (Signed)
Subjective:  Patient ID: Brian Ayers, male    DOB: May 13, 1951  Age: 69 y.o. MRN: [id number]  CC: Diabetes (3 MONTH F/U)   HPI [name] presents for DM, HTN, GERD f/u  Outpatient Medications Prior to Visit  Medication Sig Dispense Refill  . ADVAIR DISKUS 100-50 MCG/DOSE AEPB INHALE 1 PUFF INTO THE LUNGS TWO TIMES DAILY 180 each 3  . aspirin 81 MG chewable tablet Chew 81 mg by mouth daily.    Marland Kitchen azelastine (ASTELIN) 0.1 % nasal spray USE 1 SPRAY INTO EACH NOSTRIL TWICE A DAY    . Biotin 2500 MCG CAPS Take 2,500 mcg by mouth daily.     Marland Kitchen EPINEPHrine 0.3 mg/0.3 mL IJ SOAJ injection as needed.  0  . famotidine (PEPCID) 20 MG tablet TAKE 1 TABLET BY MOUTH TWICE A DAY 180 tablet 3  . fexofenadine (ALLEGRA) 180 MG tablet Take 180 mg by mouth daily.    . fluticasone (FLONASE) 50 MCG/ACT nasal spray SPRAY 1 SPRAY INTO EACH NOSTRIL EVERY DAY    . glucose blood (ONETOUCH VERIO) test strip Use as instructed 100 strip 2  . metFORMIN (GLUCOPHAGE) 500 MG tablet 1000 mg AM, 500 mg PM 270 tablet 3  . OneTouch Delica Lancets 82N MISC USE TO CHECK BS TWICE A DAY 100 each 5  . OVER THE COUNTER MEDICATION Saw Palmetto 450 mg, twice daily.    Marland Kitchen OVER THE COUNTER MEDICATION Vitamin D 3 One capsule daily.    Marland Kitchen PROAIR HFA 108 (90 BASE) MCG/ACT inhaler as needed.  0  . rosuvastatin (CRESTOR) 10 MG tablet Take 1 tablet (10 mg total) by mouth daily. 90 tablet 3  . UNABLE TO FIND Med Name: Takes allergy shots weekly    . valsartan (DIOVAN) 160 MG tablet Take 1 tablet (160 mg total) by mouth daily. 30 tablet 11  . sildenafil (VIAGRA) 100 MG tablet Take 1 tablet (100 mg total) by mouth as needed for erectile dysfunction. 12 tablet 11   No facility-administered medications prior to visit.    ROS: Review of Systems  Constitutional: Negative for appetite change, fatigue and unexpected weight change.  HENT: Negative for congestion, nosebleeds, sneezing, sore throat and trouble swallowing.   Eyes:  Negative for itching and visual disturbance.  Respiratory: Negative for cough.   Cardiovascular: Negative for chest pain, palpitations and leg swelling.  Gastrointestinal: Negative for abdominal distention, blood in stool, diarrhea and nausea.  Genitourinary: Negative for frequency and hematuria.  Musculoskeletal: Negative for back pain, gait problem, joint swelling and neck pain.  Skin: Negative for rash.  Neurological: Negative for dizziness, tremors, speech difficulty and weakness.  Psychiatric/Behavioral: Negative for agitation, dysphoric mood and sleep disturbance. The patient is not nervous/anxious.     Objective:  BP (!) 142/82 (BP Location: Left Arm)   Pulse 62   Temp 98.6 F (37 C) (Oral)   Wt 203 lb 9.6 oz (92.4 kg)   SpO2 97%   BMI 29.21 kg/m   BP Readings from Last 3 Encounters:  10/21/20 (!) 142/82  07/20/20 140/80  03/18/20 (!) 146/84    Wt Readings from Last 3 Encounters:  10/21/20 203 lb 9.6 oz (92.4 kg)  07/20/20 207 lb (93.9 kg)  03/18/20 203 lb (92.1 kg)    Physical Exam Constitutional:      General: He is not in acute distress.    Appearance: He is well-developed.     Comments: NAD  Eyes:     Conjunctiva/sclera: Conjunctivae normal.  Pupils: Pupils are equal, round, and reactive to light.  Neck:     Thyroid: No thyromegaly.     Vascular: No JVD.  Cardiovascular:     Rate and Rhythm: Normal rate and regular rhythm.     Heart sounds: Normal heart sounds. No murmur heard.  No friction rub. No gallop.   Pulmonary:     Effort: Pulmonary effort is normal. No respiratory distress.     Breath sounds: Normal breath sounds. No wheezing or rales.  Chest:     Chest wall: No tenderness.  Abdominal:     General: Bowel sounds are normal. There is no distension.     Palpations: Abdomen is soft. There is no mass.     Tenderness: There is no abdominal tenderness. There is no guarding or rebound.  Musculoskeletal:        General: No tenderness. Normal  range of motion.     Cervical back: Normal range of motion.  Lymphadenopathy:     Cervical: No cervical adenopathy.  Skin:    General: Skin is warm and dry.     Findings: No rash.  Neurological:     Mental Status: He is alert and oriented to person, place, and time.     Cranial Nerves: No cranial nerve deficit.     Motor: No abnormal muscle tone.     Coordination: Coordination normal.     Gait: Gait normal.     Deep Tendon Reflexes: Reflexes are normal and symmetric.  Psychiatric:        Behavior: Behavior normal.        Thought Content: Thought content normal.        Judgment: Judgment normal.     Lab Results  Component Value Date   WBC 7.4 03/08/2020   HGB 13.8 03/08/2020   HCT 40.9 03/08/2020   PLT 221.0 03/08/2020   GLUCOSE 136 (H) 10/15/2020   CHOL 142 03/08/2020   TRIG 76.0 03/08/2020   HDL 48.30 03/08/2020   LDLCALC 78 03/08/2020   ALT 19 03/08/2020   AST 18 03/08/2020   NA 138 10/15/2020   K 4.1 10/15/2020   CL 103 10/15/2020   CREATININE 0.94 10/15/2020   BUN 20 10/15/2020   CO2 25 10/15/2020   TSH 3.76 03/08/2020   PSA 0.77 03/08/2020   HGBA1C 7.2 (H) 10/15/2020   MICROALBUR <0.7 03/25/2018    CT CARDIAC SCORING  Addendum Date: 11/07/2018   ADDENDUM REPORT: 11/07/2018 17:47 CLINICAL DATA:  Risk stratification EXAM: Coronary Calcium Score TECHNIQUE: The patient was scanned on a [employer] scanner. Axial non-contrast 3 mm slices were carried out through the heart. The data set was analyzed on a dedicated work station and scored using the [place] method. FINDINGS: Non-cardiac: See separate report from [facility]. Ascending Aorta: Normal size, mild diffuse calcifications in the aortic arch and descending aorta. Pericardium: Normal. Coronary arteries: Normal origin. IMPRESSION: Coronary calcium score of 76. This was 20 percentile for age and sex matched control. Electronically Signed   By: Tobias Alexander   On: 11/07/2018 17:47   Result Date:  11/07/2018 EXAM: OVER-READ INTERPRETATION  CT CHEST The following report is an over-read performed by radiologist Dr. Charlett Nose of [facility] on 11/07/2018. This over-read does not include interpretation of cardiac or coronary anatomy or pathology. The coronary calcium score interpretation by the cardiologist is attached. COMPARISON:  None FINDINGS: Vascular: Heart is normal size.  Aorta is normal caliber. Mediastinum/Nodes: No adenopathy in the visualized lower  mediastinum or hila. Lungs/Pleura: Visualized lungs clear.  No effusions. Upper Abdomen: Imaging into the upper abdomen shows no acute findings. Musculoskeletal: Chest wall soft tissues are unremarkable. No acute bony abnormality. IMPRESSION: No acute or significant extracardiac abnormality. Electronically Signed: By: Rolm Baptise M.D. On: 11/07/2018 16:51    Assessment & Plan:   Jerald was seen today for diabetes.  Diagnoses and all orders for this visit:  Controlled type 2 diabetes mellitus without complication, without long-term current use of insulin (HCC) -     Blood Glucose Monitoring Suppl ([place]) w/Device KIT; USE AS DIRECTED TO CHECK BLOOD SUGARS     Meds ordered this encounter  Medications  . Blood Glucose Monitoring Suppl (ONETOUCH VERIO FLEX SYSTEM) w/Device KIT    Sig: USE AS DIRECTED TO CHECK BLOOD SUGARS    Dispense:  1 kit    Refill:  0    Dx E11.9     Follow-up: No follow-ups on file.  Walker Kehr, MD

## 2020-10-21 NOTE — Assessment & Plan Note (Signed)
[place]

## 2020-10-25 ENCOUNTER — Other Ambulatory Visit: Payer: Self-pay | Admitting: Internal Medicine

## 2020-10-25 DIAGNOSIS — J3089 Other allergic rhinitis: Secondary | ICD-10-CM | POA: Diagnosis not present

## 2020-10-25 DIAGNOSIS — J301 Allergic rhinitis due to pollen: Secondary | ICD-10-CM | POA: Diagnosis not present

## 2020-10-25 DIAGNOSIS — J3081 Allergic rhinitis due to animal (cat) (dog) hair and dander: Secondary | ICD-10-CM | POA: Diagnosis not present

## 2020-10-28 DIAGNOSIS — E119 Type 2 diabetes mellitus without complications: Secondary | ICD-10-CM | POA: Diagnosis not present

## 2020-10-28 LAB — HM DIABETES EYE EXAM

## 2020-10-31 ENCOUNTER — Ambulatory Visit: Payer: Medicare Other | Attending: Internal Medicine

## 2020-10-31 DIAGNOSIS — Z23 Encounter for immunization: Secondary | ICD-10-CM

## 2020-10-31 NOTE — Progress Notes (Signed)
° °  Covid-19 Vaccination Clinic  Name:  AMARIO LONGMORE    MRN: [id number] DOB: 09-22-1951  10/31/2020  Mr. Hallenbeck was observed post Covid-19 immunization for 15 minutes without incident. He was provided with Vaccine Information Sheet and instruction to access the V-Safe system.   Mr. Kemler was instructed to call 911 with any severe reactions post vaccine:  Difficulty breathing   Swelling of face and throat   A fast heartbeat   A bad rash all over body   Dizziness and weakness   Immunizations Administered    No immunizations on file.

## 2020-11-04 ENCOUNTER — Ambulatory Visit: Payer: Medicare Other | Admitting: Internal Medicine

## 2020-11-04 DIAGNOSIS — J3081 Allergic rhinitis due to animal (cat) (dog) hair and dander: Secondary | ICD-10-CM | POA: Diagnosis not present

## 2020-11-04 DIAGNOSIS — J301 Allergic rhinitis due to pollen: Secondary | ICD-10-CM | POA: Diagnosis not present

## 2020-11-04 DIAGNOSIS — J3089 Other allergic rhinitis: Secondary | ICD-10-CM | POA: Diagnosis not present

## 2020-11-08 DIAGNOSIS — J301 Allergic rhinitis due to pollen: Secondary | ICD-10-CM | POA: Diagnosis not present

## 2020-11-08 DIAGNOSIS — J3089 Other allergic rhinitis: Secondary | ICD-10-CM | POA: Diagnosis not present

## 2020-11-08 DIAGNOSIS — J3081 Allergic rhinitis due to animal (cat) (dog) hair and dander: Secondary | ICD-10-CM | POA: Diagnosis not present

## 2020-11-15 DIAGNOSIS — J301 Allergic rhinitis due to pollen: Secondary | ICD-10-CM | POA: Diagnosis not present

## 2020-11-15 DIAGNOSIS — J3089 Other allergic rhinitis: Secondary | ICD-10-CM | POA: Diagnosis not present

## 2020-11-15 DIAGNOSIS — J3081 Allergic rhinitis due to animal (cat) (dog) hair and dander: Secondary | ICD-10-CM | POA: Diagnosis not present

## 2020-11-16 ENCOUNTER — Ambulatory Visit: Payer: Medicare Other

## 2020-11-17 LAB — HM DIABETES FOOT EXAM: HM Diabetic Foot Exam: NORMAL

## 2020-11-19 DIAGNOSIS — J3081 Allergic rhinitis due to animal (cat) (dog) hair and dander: Secondary | ICD-10-CM | POA: Diagnosis not present

## 2020-11-19 DIAGNOSIS — J3089 Other allergic rhinitis: Secondary | ICD-10-CM | POA: Diagnosis not present

## 2020-11-19 DIAGNOSIS — J301 Allergic rhinitis due to pollen: Secondary | ICD-10-CM | POA: Diagnosis not present

## 2020-11-22 DIAGNOSIS — J3089 Other allergic rhinitis: Secondary | ICD-10-CM | POA: Diagnosis not present

## 2020-11-22 DIAGNOSIS — J3081 Allergic rhinitis due to animal (cat) (dog) hair and dander: Secondary | ICD-10-CM | POA: Diagnosis not present

## 2020-11-22 DIAGNOSIS — J301 Allergic rhinitis due to pollen: Secondary | ICD-10-CM | POA: Diagnosis not present

## 2020-11-25 ENCOUNTER — Other Ambulatory Visit: Payer: Self-pay | Admitting: Internal Medicine

## 2020-11-29 DIAGNOSIS — J301 Allergic rhinitis due to pollen: Secondary | ICD-10-CM | POA: Diagnosis not present

## 2020-11-29 DIAGNOSIS — J3089 Other allergic rhinitis: Secondary | ICD-10-CM | POA: Diagnosis not present

## 2020-11-29 DIAGNOSIS — J3081 Allergic rhinitis due to animal (cat) (dog) hair and dander: Secondary | ICD-10-CM | POA: Diagnosis not present

## 2020-12-01 ENCOUNTER — Encounter: Payer: Self-pay | Admitting: Podiatry

## 2020-12-01 ENCOUNTER — Other Ambulatory Visit: Payer: Self-pay

## 2020-12-01 ENCOUNTER — Ambulatory Visit (INDEPENDENT_AMBULATORY_CARE_PROVIDER_SITE_OTHER): Payer: Medicare Other | Admitting: Podiatry

## 2020-12-01 DIAGNOSIS — E119 Type 2 diabetes mellitus without complications: Secondary | ICD-10-CM | POA: Diagnosis not present

## 2020-12-01 NOTE — Progress Notes (Signed)
This patient presents to the office with for diabetic foot exam.  This patient  says there  is  no pain and discomfort in his  feet.  He says he has callus on the balls of both feet which was treated years ago by Dr.  Blenda Mounts with acid. Patient has no history of infection or drainage from both feet.  . This patient presents  to the office today for treatment of his callus  and a foot evaluation due to history of  diabetes.  General Appearance  Alert, conversant and in no acute stress.  Vascular  Dorsalis pedis and posterior tibial  pulses are palpable  bilaterally.  Capillary return is within normal limits  bilaterally. Temperature is within normal limits  bilaterally.  Neurologic  Senn-Weinstein monofilament wire test within normal limits  bilaterally. Muscle power within normal limits bilaterally.  Nails Normal nails with no signs of infection or drainage.  Orthopedic  No limitations of motion of motion feet .  No crepitus or effusions noted.  No bony pathology or digital deformities noted. 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  Bone spur retrocalcaneally left foot  Skin  normotropic skin with no porokeratosis noted bilaterally.  No signs of infections or ulcers noted.  Diffuse callus  B/L  Diabetes with no foot complications  rov.    A diabetic foot exam was performed and there is no evidence of any vascular or neurologic pathology.     RTC 1 year.   Gardiner Barefoot DPM

## 2020-12-06 DIAGNOSIS — J3089 Other allergic rhinitis: Secondary | ICD-10-CM | POA: Diagnosis not present

## 2020-12-06 DIAGNOSIS — J3081 Allergic rhinitis due to animal (cat) (dog) hair and dander: Secondary | ICD-10-CM | POA: Diagnosis not present

## 2020-12-06 DIAGNOSIS — J301 Allergic rhinitis due to pollen: Secondary | ICD-10-CM | POA: Diagnosis not present

## 2020-12-08 DIAGNOSIS — J3089 Other allergic rhinitis: Secondary | ICD-10-CM | POA: Diagnosis not present

## 2020-12-08 DIAGNOSIS — J4520 Mild intermittent asthma, uncomplicated: Secondary | ICD-10-CM | POA: Diagnosis not present

## 2020-12-08 DIAGNOSIS — R051 Acute cough: Secondary | ICD-10-CM | POA: Diagnosis not present

## 2020-12-08 DIAGNOSIS — J3081 Allergic rhinitis due to animal (cat) (dog) hair and dander: Secondary | ICD-10-CM | POA: Diagnosis not present

## 2020-12-08 DIAGNOSIS — J301 Allergic rhinitis due to pollen: Secondary | ICD-10-CM | POA: Diagnosis not present

## 2020-12-08 MED ORDER — ONETOUCH DELICA LANCETS 30G MISC
5 refills | Status: DC
Start: 2020-12-08 — End: 2021-11-16

## 2020-12-08 MED ORDER — ONETOUCH VERIO VI STRP
1.0000 | ORAL_STRIP | Freq: Two times a day (BID) | 5 refills | Status: DC
Start: 2020-12-08 — End: 2021-02-07

## 2020-12-24 DIAGNOSIS — H9113 Presbycusis, bilateral: Secondary | ICD-10-CM | POA: Diagnosis not present

## 2020-12-27 DIAGNOSIS — J3089 Other allergic rhinitis: Secondary | ICD-10-CM | POA: Diagnosis not present

## 2020-12-27 DIAGNOSIS — J4520 Mild intermittent asthma, uncomplicated: Secondary | ICD-10-CM | POA: Diagnosis not present

## 2020-12-27 DIAGNOSIS — J301 Allergic rhinitis due to pollen: Secondary | ICD-10-CM | POA: Diagnosis not present

## 2020-12-27 DIAGNOSIS — J3081 Allergic rhinitis due to animal (cat) (dog) hair and dander: Secondary | ICD-10-CM | POA: Diagnosis not present

## 2021-01-04 DIAGNOSIS — J3089 Other allergic rhinitis: Secondary | ICD-10-CM | POA: Diagnosis not present

## 2021-01-04 DIAGNOSIS — J301 Allergic rhinitis due to pollen: Secondary | ICD-10-CM | POA: Diagnosis not present

## 2021-01-04 DIAGNOSIS — J3081 Allergic rhinitis due to animal (cat) (dog) hair and dander: Secondary | ICD-10-CM | POA: Diagnosis not present

## 2021-01-05 ENCOUNTER — Encounter: Payer: Self-pay | Admitting: Internal Medicine

## 2021-01-05 ENCOUNTER — Other Ambulatory Visit: Payer: Self-pay | Admitting: Internal Medicine

## 2021-01-05 DIAGNOSIS — E119 Type 2 diabetes mellitus without complications: Secondary | ICD-10-CM

## 2021-01-05 NOTE — Progress Notes (Signed)
x

## 2021-01-06 DIAGNOSIS — J301 Allergic rhinitis due to pollen: Secondary | ICD-10-CM | POA: Diagnosis not present

## 2021-01-06 DIAGNOSIS — J3089 Other allergic rhinitis: Secondary | ICD-10-CM | POA: Diagnosis not present

## 2021-01-06 DIAGNOSIS — J3081 Allergic rhinitis due to animal (cat) (dog) hair and dander: Secondary | ICD-10-CM | POA: Diagnosis not present

## 2021-01-10 DIAGNOSIS — J301 Allergic rhinitis due to pollen: Secondary | ICD-10-CM | POA: Diagnosis not present

## 2021-01-10 DIAGNOSIS — J3089 Other allergic rhinitis: Secondary | ICD-10-CM | POA: Diagnosis not present

## 2021-01-10 DIAGNOSIS — J3081 Allergic rhinitis due to animal (cat) (dog) hair and dander: Secondary | ICD-10-CM | POA: Diagnosis not present

## 2021-01-17 DIAGNOSIS — J3089 Other allergic rhinitis: Secondary | ICD-10-CM | POA: Diagnosis not present

## 2021-01-17 DIAGNOSIS — J301 Allergic rhinitis due to pollen: Secondary | ICD-10-CM | POA: Diagnosis not present

## 2021-01-17 DIAGNOSIS — J3081 Allergic rhinitis due to animal (cat) (dog) hair and dander: Secondary | ICD-10-CM | POA: Diagnosis not present

## 2021-01-24 DIAGNOSIS — J3081 Allergic rhinitis due to animal (cat) (dog) hair and dander: Secondary | ICD-10-CM | POA: Diagnosis not present

## 2021-01-24 DIAGNOSIS — J3089 Other allergic rhinitis: Secondary | ICD-10-CM | POA: Diagnosis not present

## 2021-01-24 DIAGNOSIS — J301 Allergic rhinitis due to pollen: Secondary | ICD-10-CM | POA: Diagnosis not present

## 2021-01-28 ENCOUNTER — Other Ambulatory Visit: Payer: Self-pay | Admitting: Internal Medicine

## 2021-01-31 ENCOUNTER — Other Ambulatory Visit (INDEPENDENT_AMBULATORY_CARE_PROVIDER_SITE_OTHER): Payer: Medicare Other

## 2021-01-31 DIAGNOSIS — E119 Type 2 diabetes mellitus without complications: Secondary | ICD-10-CM | POA: Diagnosis not present

## 2021-01-31 LAB — BASIC METABOLIC PANEL
BUN: 22 mg/dL (ref 6–23)
CO2: 26 mEq/L (ref 19–32)
Calcium: 9.2 mg/dL (ref 8.4–10.5)
Chloride: 103 mEq/L (ref 96–112)
Creatinine, Ser: 0.95 mg/dL (ref 0.40–1.50)
GFR: 82.72 mL/min (ref >60.00)
Glucose, Bld: 152 mg/dL — ABNORMAL HIGH (ref 70–99)
Potassium: 4.2 mEq/L (ref 3.5–5.1)
Sodium: 138 mEq/L (ref 135–145)

## 2021-01-31 LAB — HEMOGLOBIN A1C: Hgb A1c MFr Bld: 7.6 % — ABNORMAL HIGH (ref 4.6–6.5)

## 2021-02-01 ENCOUNTER — Ambulatory Visit: Payer: Medicare Other | Admitting: Internal Medicine

## 2021-02-01 DIAGNOSIS — J3081 Allergic rhinitis due to animal (cat) (dog) hair and dander: Secondary | ICD-10-CM | POA: Diagnosis not present

## 2021-02-01 DIAGNOSIS — J3089 Other allergic rhinitis: Secondary | ICD-10-CM | POA: Diagnosis not present

## 2021-02-01 DIAGNOSIS — J301 Allergic rhinitis due to pollen: Secondary | ICD-10-CM | POA: Diagnosis not present

## 2021-02-02 ENCOUNTER — Other Ambulatory Visit: Payer: Self-pay | Admitting: Internal Medicine

## 2021-02-02 MED ORDER — REPAGLINIDE 1 MG PO TABS: 1.0000 mg | ORAL_TABLET | Freq: Three times a day (TID) | ORAL | 5 refills | Status: DC

## 2021-02-03 ENCOUNTER — Other Ambulatory Visit: Payer: Self-pay

## 2021-02-03 ENCOUNTER — Encounter: Payer: Self-pay | Admitting: Internal Medicine

## 2021-02-03 ENCOUNTER — Ambulatory Visit (INDEPENDENT_AMBULATORY_CARE_PROVIDER_SITE_OTHER): Payer: Medicare Other | Admitting: Internal Medicine

## 2021-02-03 DIAGNOSIS — J4520 Mild intermittent asthma, uncomplicated: Secondary | ICD-10-CM

## 2021-02-03 DIAGNOSIS — F418 Other specified anxiety disorders: Secondary | ICD-10-CM | POA: Diagnosis not present

## 2021-02-03 DIAGNOSIS — E119 Type 2 diabetes mellitus without complications: Secondary | ICD-10-CM | POA: Diagnosis not present

## 2021-02-03 NOTE — Progress Notes (Signed)
Subjective:  Patient ID: Brian Ayers, male    DOB: August 26, 1951  Age: 70 y.o. MRN: [id number]  CC: Follow-up (F/U on high glucose & A1C)   HPI [name] presents for DM w/elevated glucose.  He has been very stressed over Covid 19 for a long time.  There has been a lot of anxiety.  He has gained weight.  He has been taking Metformin and yesterday he started to take Prandin.   Outpatient Medications Prior to Visit  Medication Sig Dispense Refill  . ADVAIR DISKUS 100-50 MCG/DOSE AEPB INHALE 1 PUFF INTO THE LUNGS TWO TIMES DAILY 180 each 3  . aspirin 81 MG chewable tablet Chew 81 mg by mouth daily.    Marland Kitchen azelastine (ASTELIN) 0.1 % nasal spray USE 1 SPRAY INTO EACH NOSTRIL TWICE A DAY    . Biotin 2500 MCG CAPS Take 2,500 mcg by mouth daily.    . Blood Glucose Monitoring Suppl (ONETOUCH VERIO FLEX SYSTEM) w/Device KIT USE AS DIRECTED TO CHECK BLOOD SUGARS 1 kit 0  . EPINEPHrine 0.3 mg/0.3 mL IJ SOAJ injection as needed.  0  . famotidine (PEPCID) 20 MG tablet TAKE 1 TABLET BY MOUTH TWICE A DAY 180 tablet 3  . fexofenadine (ALLEGRA) 180 MG tablet Take 180 mg by mouth daily.    . fluticasone (FLONASE) 50 MCG/ACT nasal spray SPRAY 1 SPRAY INTO EACH NOSTRIL EVERY DAY    . glucose blood (ONETOUCH VERIO) test strip 1 each by Other route 2 (two) times daily. 100 strip 5  . metFORMIN (GLUCOPHAGE) 500 MG tablet 1000 MG AM, 500 MG PM 270 tablet 3  . OneTouch Delica Lancets 40G MISC USE TO CHECK BS TWICE A DAY 100 each 5  . OVER THE COUNTER MEDICATION Saw Palmetto 450 mg, twice daily.    Marland Kitchen OVER THE COUNTER MEDICATION Vitamin D 3 One capsule daily.    Marland Kitchen PROAIR HFA 108 (90 BASE) MCG/ACT inhaler as needed.  0  . repaglinide (PRANDIN) 1 MG tablet Take 1 tablet (1 mg total) by mouth 3 (three) times daily before meals. 90 tablet 5  . rosuvastatin (CRESTOR) 10 MG tablet TAKE 1 TABLET BY MOUTH EVERY DAY 90 tablet 1  . sildenafil (VIAGRA) 100 MG tablet Take 1 tablet (100 mg total) by mouth as needed for  erectile dysfunction. 12 tablet 11  . UNABLE TO FIND Med Name: Takes allergy shots weekly    . valsartan (DIOVAN) 160 MG tablet TAKE 1 TABLET BY MOUTH EVERY DAY 90 tablet 3   No facility-administered medications prior to visit.    ROS: Review of Systems  Constitutional: Positive for unexpected weight change. Negative for appetite change and fatigue.  HENT: Negative for congestion, nosebleeds, sneezing, sore throat and trouble swallowing.   Eyes: Negative for itching and visual disturbance.  Respiratory: Negative for cough.   Cardiovascular: Negative for chest pain, palpitations and leg swelling.  Gastrointestinal: Negative for abdominal distention, blood in stool, diarrhea and nausea.  Genitourinary: Negative for frequency and hematuria.  Musculoskeletal: Negative for back pain, gait problem, joint swelling and neck pain.  Skin: Negative for rash.  Neurological: Negative for dizziness, tremors, speech difficulty and weakness.  Psychiatric/Behavioral: Negative for agitation, decreased concentration, dysphoric mood, sleep disturbance and suicidal ideas. The patient is nervous/anxious.     Objective:  BP (!) 144/76 (BP Location: Left Arm)   Pulse 71   Temp 98.8 F (37.1 C) (Oral)   Ht $R'5\' 10"'ks$  (1.778 m)   Wt 210 lb  6.4 oz (95.4 kg)   SpO2 95%   BMI 30.19 kg/m   BP Readings from Last 3 Encounters:  02/03/21 (!) 144/76  10/21/20 (!) 142/82  07/20/20 140/80    Wt Readings from Last 3 Encounters:  02/03/21 210 lb 6.4 oz (95.4 kg)  10/21/20 203 lb 9.6 oz (92.4 kg)  07/20/20 207 lb (93.9 kg)    Physical Exam Constitutional:      General: He is not in acute distress.    Appearance: He is well-developed. He is obese.     Comments: NAD  HENT:     Mouth/Throat:     Mouth: Oropharynx is clear and moist.  Eyes:     Conjunctiva/sclera: Conjunctivae normal.     Pupils: Pupils are equal, round, and reactive to light.  Neck:     Thyroid: No thyromegaly.     Vascular: No JVD.   Cardiovascular:     Rate and Rhythm: Normal rate and regular rhythm.     Pulses: Intact distal pulses.     Heart sounds: Normal heart sounds. No murmur heard. No friction rub. No gallop.   Pulmonary:     Effort: Pulmonary effort is normal. No respiratory distress.     Breath sounds: Normal breath sounds. No wheezing or rales.  Chest:     Chest wall: No tenderness.  Abdominal:     General: Bowel sounds are normal. There is no distension.     Palpations: Abdomen is soft. There is no mass.     Tenderness: There is no abdominal tenderness. There is no guarding or rebound.  Musculoskeletal:        General: No tenderness or edema. Normal range of motion.     Cervical back: Normal range of motion.  Lymphadenopathy:     Cervical: No cervical adenopathy.  Skin:    General: Skin is warm and dry.     Findings: No rash.  Neurological:     Mental Status: He is alert and oriented to person, place, and time.     Cranial Nerves: No cranial nerve deficit.     Motor: No abnormal muscle tone.     Coordination: He displays a negative Romberg sign. Coordination normal.     Gait: Gait normal.     Deep Tendon Reflexes: Reflexes are normal and symmetric.  Psychiatric:        Mood and Affect: Mood and affect normal.        Behavior: Behavior normal.        Thought Content: Thought content normal.        Judgment: Judgment normal.     Lab Results  Component Value Date   WBC 7.4 03/08/2020   HGB 13.8 03/08/2020   HCT 40.9 03/08/2020   PLT 221.0 03/08/2020   GLUCOSE 152 (H) 01/31/2021   CHOL 142 03/08/2020   TRIG 76.0 03/08/2020   HDL 48.30 03/08/2020   LDLCALC 78 03/08/2020   ALT 19 03/08/2020   AST 18 03/08/2020   NA 138 01/31/2021   K 4.2 01/31/2021   CL 103 01/31/2021   CREATININE 0.95 01/31/2021   BUN 22 01/31/2021   CO2 26 01/31/2021   TSH 3.76 03/08/2020   PSA 0.77 03/08/2020   HGBA1C 7.6 (H) 01/31/2021   MICROALBUR <0.7 03/25/2018    CT CARDIAC SCORING  Addendum Date:  11/07/2018   ADDENDUM REPORT: 11/07/2018 17:47 CLINICAL DATA:  Risk stratification EXAM: Coronary Calcium Score TECHNIQUE: The patient was scanned on a [employer] scanner. Axial  non-contrast 3 mm slices were carried out through the heart. The data set was analyzed on a dedicated work station and scored using the [place]. FINDINGS: Non-cardiac: See separate report from [facility]. Ascending Aorta: Normal size, mild diffuse calcifications in the aortic arch and descending aorta. Pericardium: Normal. Coronary arteries: Normal origin. IMPRESSION: Coronary calcium score of 76. This was 38 percentile for age and sex matched control. Electronically Signed   By: Ena Dawley   On: 11/07/2018 17:47   Result Date: 11/07/2018 EXAM: OVER-READ INTERPRETATION  CT CHEST The following report is an over-read performed by radiologist Dr. Rolm Baptise of [facility] on 11/07/2018. This over-read does not include interpretation of cardiac or coronary anatomy or pathology. The coronary calcium score interpretation by the cardiologist is attached. COMPARISON:  None FINDINGS: Vascular: Heart is normal size.  Aorta is normal caliber. Mediastinum/Nodes: No adenopathy in the visualized lower mediastinum or hila. Lungs/Pleura: Visualized lungs clear.  No effusions. Upper Abdomen: Imaging into the upper abdomen shows no acute findings. Musculoskeletal: Chest wall soft tissues are unremarkable. No acute bony abnormality. IMPRESSION: No acute or significant extracardiac abnormality. Electronically Signed: By: Rolm Baptise M.D. On: 11/07/2018 16:51    Assessment & Plan:     Walker Kehr, MD

## 2021-02-03 NOTE — Assessment & Plan Note (Signed)
Brian Ayers has been very stressed over Covid 19 for a long time.  There has been a lot of anxiety.  He has gained weight. We discussed the negative effect of chronic stress on his wellbeing including diabetes control.  He declined using an antidepressant at present

## 2021-02-03 NOTE — Assessment & Plan Note (Signed)
Worse Cont Metformin Added Prandin Endo appt pending

## 2021-02-03 NOTE — Assessment & Plan Note (Signed)
Proventil prn

## 2021-02-04 ENCOUNTER — Other Ambulatory Visit: Payer: Self-pay

## 2021-02-07 ENCOUNTER — Other Ambulatory Visit: Payer: Self-pay

## 2021-02-07 ENCOUNTER — Ambulatory Visit (INDEPENDENT_AMBULATORY_CARE_PROVIDER_SITE_OTHER): Payer: Medicare Other | Admitting: Internal Medicine

## 2021-02-07 ENCOUNTER — Ambulatory Visit: Payer: Medicare Other | Admitting: Internal Medicine

## 2021-02-07 ENCOUNTER — Encounter: Payer: Self-pay | Admitting: Internal Medicine

## 2021-02-07 VITALS — BP 140/82 | HR 76 | Ht 70.0 in | Wt 208.5 lb

## 2021-02-07 DIAGNOSIS — J3089 Other allergic rhinitis: Secondary | ICD-10-CM | POA: Diagnosis not present

## 2021-02-07 DIAGNOSIS — E785 Hyperlipidemia, unspecified: Secondary | ICD-10-CM | POA: Diagnosis not present

## 2021-02-07 DIAGNOSIS — J3081 Allergic rhinitis due to animal (cat) (dog) hair and dander: Secondary | ICD-10-CM | POA: Diagnosis not present

## 2021-02-07 DIAGNOSIS — E1165 Type 2 diabetes mellitus with hyperglycemia: Secondary | ICD-10-CM

## 2021-02-07 DIAGNOSIS — J301 Allergic rhinitis due to pollen: Secondary | ICD-10-CM | POA: Diagnosis not present

## 2021-02-07 LAB — POCT GLUCOSE (DEVICE FOR HOME USE): POC Glucose: 158 mg/dl — AB (ref 70–99)

## 2021-02-07 MED ORDER — METFORMIN HCL ER 500 MG PO TB24: 1000.0000 mg | ORAL_TABLET | Freq: Two times a day (BID) | ORAL | 3 refills | Status: DC

## 2021-02-07 MED ORDER — ONETOUCH VERIO VI STRP: 1.0000 | ORAL_STRIP | Freq: Three times a day (TID) | 3 refills | Status: DC

## 2021-02-07 NOTE — Patient Instructions (Addendum)
-   Increase Metformin 500 mg, Two tablets with Breakfast and TWO tablets with Supper - Continue Prandin  1 mg, 1 tablet before Breakfast, Lunch and Dinner      HOW TO TREAT LOW BLOOD SUGARS (Blood sugar LESS THAN 70 MG/DL)  Please follow the RULE OF 15 for the treatment of hypoglycemia treatment (when your (blood sugars are less than 70 mg/dL)    STEP 1: Take 15 grams of carbohydrates when your blood sugar is low, which includes:   3-4 GLUCOSE TABS  OR  3-4 OZ OF JUICE OR REGULAR SODA OR  ONE TUBE OF GLUCOSE GEL     STEP 2: RECHECK blood sugar in 15 MINUTES STEP 3: If your blood sugar is still low at the 15 minute recheck --> then, go back to STEP 1 and treat AGAIN with another 15 grams of carbohydrates.

## 2021-02-07 NOTE — Progress Notes (Signed)
Name: Brian Ayers  MRN/ DOB: [id number], 1951/11/07   Age/ Sex: 70 y.o., male    PCP: Plotnikov, Evie Lacks, MD   Reason for Endocrinology Evaluation: Type 2 Diabetes Mellitus     Date of Initial Endocrinology Visit: 02/07/2021     PATIENT IDENTIFIER: Brian Ayers is a 70 y.o. male with a past medical history of T2DM, Asthma and Dyslipidemia, with Hx of alcohol abuse. The patient presented for initial endocrinology clinic visit on 02/07/2021 for consultative assistance with his diabetes management.    HPI: Mr. Dubree was    Diagnosed with DM in 2015 Prior Medications tried/Intolerance: repaglinide started ~2-3 days ago  Currently checking blood sugars 1 x / day,  before breakfast.  Hypoglycemia episodes :no             Hemoglobin A1c has ranged from 6.7% in 2019, peaking at 7.6% in 2022. Patient required assistance for hypoglycemia: no Patient has required hospitalization within the last 1 year from hyper or hypoglycemia: no  In terms of diet, the patient eats 3 meals a day, snacks 2x a day    HOME DIABETES REGIMEN: Metformin 500 mg, 2 tabs QAM and 1 tab in the evening Repaglinide 1 mg TIDQAC  Statin: yes ACE-I/ARB: yes Prior Diabetic Education: yes    METER DOWNLOAD SUMMARY: Date range evaluated: 2/7-2/21/2022  Average Number Tests/Day = 1.3 Overall Mean FS Glucose = 157 Standard Deviation = 9  BG Ranges: Low = 142 High = 175   Hypoglycemic Events/30 Days: BG < 50 = 0 Episodes of symptomatic severe hypoglycemia = 0   DIABETIC COMPLICATIONS: Microvascular complications:     Denies: CKD, retinopathy, neuropathy  Last eye exam: Completed 12/2019  Macrovascular complications:   Denies: CAD, PVD, CVA   PAST HISTORY: Past Medical History:  Past Medical History:  Diagnosis Date  . Alcohol abuse    hixtory of  . Allergy   . Asthma childhood  . Cancer ([place])    skin cancer on face X1  . Cataract   . Chronic kidney disease   . Diabetes  mellitus without complication ([place])   . GERD (gastroesophageal reflux disease)   . History of kidney stones   . Hx of colonic polyps   . Hyperlipidemia   . Rhinitis    on allery shots   Past Surgical History:  Past Surgical History:  Procedure Laterality Date  . CATARACT EXTRACTION Left   . EXTRACORPOREAL SHOCK WAVE LITHOTRIPSY Right 03/14/2018   Procedure: RIGHT EXTRACORPOREAL SHOCK WAVE LITHOTRIPSY (ESWL);  Surgeon: Raynelle Bring, MD;  Location: [facility];  Service: Urology;  Laterality: Right;  . hernia with hydrocele  1973  . left shoulder pin setting  1978  . TONSILLECTOMY  1967  . WISDOM TOOTH EXTRACTION  2004      Social History:  reports that he quit smoking about 35 years ago. He quit smokeless tobacco use about 35 years ago.  His smokeless tobacco use included snuff and chew. He reports that he does not drink alcohol and does not use drugs. Family History:  Family History  Problem Relation Age of Onset  . Cancer Mother        breast (hx of)  . Other Mother        trigeminal neuralgia s/p gamma knife/ CHF  . Hypertension Father   . Diabetes Neg Hx   . Colon cancer Neg Hx   . Stomach cancer Neg Hx   . Rectal cancer Neg Hx  HOME MEDICATIONS: Allergies as of 02/07/2021      Reactions   Pseudoephedrine    REACTION: tachycardia   Simvastatin    GERD      Medication List       Accurate as of February 07, 2021  9:01 AM. If you have any questions, ask your nurse or doctor.        Advair Diskus 100-50 MCG/DOSE Aepb Generic drug: Fluticasone-Salmeterol INHALE 1 PUFF INTO THE LUNGS TWO TIMES DAILY   aspirin 81 MG chewable tablet Chew 81 mg by mouth daily.   azelastine 0.1 % nasal spray Commonly known as: ASTELIN USE 1 SPRAY INTO EACH NOSTRIL TWICE A DAY   Biotin 2500 MCG Caps Take 2,500 mcg by mouth daily.   EPINEPHrine 0.3 mg/0.3 mL Soaj injection Commonly known as: EPI-PEN as needed.   famotidine 20 MG tablet Commonly known as: PEPCID TAKE 1  TABLET BY MOUTH TWICE A DAY   fexofenadine 180 MG tablet Commonly known as: ALLEGRA Take 180 mg by mouth daily.   fluticasone 50 MCG/ACT nasal spray Commonly known as: FLONASE SPRAY 1 SPRAY INTO EACH NOSTRIL EVERY DAY   metFORMIN 500 MG tablet Commonly known as: GLUCOPHAGE 1000 MG AM, 500 MG PM   OneTouch Delica Lancets 42P Misc USE TO CHECK BS TWICE A DAY   OneTouch Verio Flex System w/Device Kit USE AS DIRECTED TO CHECK BLOOD SUGARS   OneTouch Verio test strip Generic drug: glucose blood 1 each by Other route 2 (two) times daily.   OVER THE COUNTER MEDICATION Saw Palmetto 450 mg, twice daily.   OVER THE COUNTER MEDICATION Vitamin D 3 One capsule daily.   ProAir HFA 108 (90 Base) MCG/ACT inhaler Generic drug: albuterol as needed.   repaglinide 1 MG tablet Commonly known as: Prandin Take 1 tablet (1 mg total) by mouth 3 (three) times daily before meals.   rosuvastatin 10 MG tablet Commonly known as: CRESTOR TAKE 1 TABLET BY MOUTH EVERY DAY   sildenafil 100 MG tablet Commonly known as: Viagra Take 1 tablet (100 mg total) by mouth as needed for erectile dysfunction.   UNABLE TO FIND Med Name: Takes allergy shots weekly   valsartan 160 MG tablet Commonly known as: DIOVAN TAKE 1 TABLET BY MOUTH EVERY DAY        ALLERGIES: Allergies  Allergen Reactions  . Pseudoephedrine     REACTION: tachycardia  . Simvastatin     GERD     REVIEW OF SYSTEMS: A comprehensive ROS was conducted with the patient and is negative except as per HPI and below:  Review of Systems  Gastrointestinal: Positive for diarrhea. Negative for nausea.  Genitourinary: Positive for frequency.  Endo/Heme/Allergies: Positive for polydipsia.      OBJECTIVE:   VITAL SIGNS: BP 140/82   Pulse 76   Ht '5\' 10"'  (1.778 m)   Wt 208 lb 8 oz (94.6 kg)   SpO2 98%   BMI 29.92 kg/m    PHYSICAL EXAM:  General: Pt appears well and is in NAD  Neck: General: Supple without adenopathy or  carotid bruits. Thyroid: Thyroid size normal.  No goiter or nodules appreciated.   Lungs: Clear with good BS bilat with no rales, rhonchi, or wheezes  Heart: RRR with normal S1 and S2 and no gallops; no murmurs; no rub  Abdomen: Normoactive bowel sounds, soft, nontender, without masses or organomegaly palpable  Extremities:  Lower extremities - No pretibial edema. No lesions.  Skin: Normal texture and temperature to palpation.  No rash noted.  Neuro: MS is good with appropriate affect, pt is alert and Ox3     DATA REVIEWED:  Lab Results  Component Value Date   HGBA1C 7.6 (H) 01/31/2021   HGBA1C 7.2 (H) 10/15/2020   HGBA1C 7.2 (H) 07/01/2020   Lab Results  Component Value Date   MICROALBUR <0.7 03/25/2018   LDLCALC 78 03/08/2020   CREATININE 0.95 01/31/2021   Lab Results  Component Value Date   MICRALBCREAT 5.6 03/25/2018    Lab Results  Component Value Date   CHOL 142 03/08/2020   HDL 48.30 03/08/2020   LDLCALC 78 03/08/2020   TRIG 76.0 03/08/2020   CHOLHDL 3 03/08/2020        ASSESSMENT / PLAN / RECOMMENDATIONS:   1) Type 2 Diabetes Mellitus, Sub-Optimally controlled, Without complications - Most recent A1c of 7.6 %. Goal A1c < 7.0 %.   Plan: GENERAL: I have discussed with the patient the pathophysiology of diabetes. We went over the natural progression of the disease. We talked about both insulin resistance and insulin deficiency. We stressed the importance of lifestyle changes including diet and exercise. I explained the complications associated with diabetes including retinopathy, nephropathy, neuropathy as well as increased risk of cardiovascular disease. We went over the benefit seen with glycemic control.  With an A1c of 7.6 % , this is most likely due to post-prandial hyperglycemia, will refer to our RD  He was recently started on Repaglinide, has only taken it for 2 days, discussed risk of hypoglycemia, I have encouraged him to check glucose before meals. We  reviewed rule of 15 for hypoglycemia  I will increase his metformin to max dose and continue Repaglinide.   He asked about GLP-1 agonists, he has no hx of pancreatitis. We will consider this in the future but since he just started on Repaglinide ~ 2 days ago, no changes at this time      MEDICATIONS: - Increase Metformin 500 mg, Two tablets with Breakfast and TWO tablets with Supper - Continue Prandin  1 mg, 1 tablet before Breakfast, Lunch and Dinner    EDUCATION / INSTRUCTIONS:  BG monitoring instructions: Patient is instructed to check his blood sugars 3 times a day, before meals .  Call [facility] if: BG persistently < 70  . I reviewed the Rule of 15 for the treatment of hypoglycemia in detail with the patient. Literature supplied.   2) Diabetic complications:   Eye: Does not have known diabetic retinopathy.   Neuro/ Feet: Does not have known diabetic peripheral neuropathy.  Renal: Patient does not have known baseline CKD. He is on an ACEI/ARB at present.   3) Dyslipidemia  - LDL at goal , he is on rosuvastatin 10 mg daily . Discussed the cardiovascular benefits of statins and the importance of compliance.       one F/U in 3 months   Signed electronically by: Mack Guise, MD  [facility]  [facility] [address] [place], [place] [address] Phone: [phone] FAX: [phone]   CC: Cassandria Anger, MD [place] [address] Phone: [phone]  Fax: [phone]    Return to Endocrinology clinic as below: Future Appointments  Date Time Provider [place]  12/02/2021 10:00 AM Gardiner Barefoot, DPM [facility]

## 2021-02-08 ENCOUNTER — Ambulatory Visit: Payer: Medicare Other | Admitting: Internal Medicine

## 2021-02-11 DIAGNOSIS — L570 Actinic keratosis: Secondary | ICD-10-CM | POA: Diagnosis not present

## 2021-02-11 DIAGNOSIS — L821 Other seborrheic keratosis: Secondary | ICD-10-CM | POA: Diagnosis not present

## 2021-02-11 DIAGNOSIS — Z85828 Personal history of other malignant neoplasm of skin: Secondary | ICD-10-CM | POA: Diagnosis not present

## 2021-02-11 DIAGNOSIS — D225 Melanocytic nevi of trunk: Secondary | ICD-10-CM | POA: Diagnosis not present

## 2021-02-11 DIAGNOSIS — C44612 Basal cell carcinoma of skin of right upper limb, including shoulder: Secondary | ICD-10-CM | POA: Diagnosis not present

## 2021-02-11 DIAGNOSIS — D485 Neoplasm of uncertain behavior of skin: Secondary | ICD-10-CM | POA: Diagnosis not present

## 2021-02-11 DIAGNOSIS — D2272 Melanocytic nevi of left lower limb, including hip: Secondary | ICD-10-CM | POA: Diagnosis not present

## 2021-02-11 DIAGNOSIS — L578 Other skin changes due to chronic exposure to nonionizing radiation: Secondary | ICD-10-CM | POA: Diagnosis not present

## 2021-02-11 DIAGNOSIS — L814 Other melanin hyperpigmentation: Secondary | ICD-10-CM | POA: Diagnosis not present

## 2021-02-14 ENCOUNTER — Encounter: Payer: Self-pay | Admitting: Internal Medicine

## 2021-02-14 DIAGNOSIS — J301 Allergic rhinitis due to pollen: Secondary | ICD-10-CM | POA: Diagnosis not present

## 2021-02-14 DIAGNOSIS — J3081 Allergic rhinitis due to animal (cat) (dog) hair and dander: Secondary | ICD-10-CM | POA: Diagnosis not present

## 2021-02-14 DIAGNOSIS — J3089 Other allergic rhinitis: Secondary | ICD-10-CM | POA: Diagnosis not present

## 2021-02-14 NOTE — Telephone Encounter (Signed)
Patient requests to be called at ph# [phone] (Patient does not want MyChart message-Patient requests to be called) re: Patient has questions about medication timing before eating food and blood sugar readings timing.

## 2021-02-21 DIAGNOSIS — J301 Allergic rhinitis due to pollen: Secondary | ICD-10-CM | POA: Diagnosis not present

## 2021-02-21 DIAGNOSIS — J3089 Other allergic rhinitis: Secondary | ICD-10-CM | POA: Diagnosis not present

## 2021-02-21 DIAGNOSIS — J3081 Allergic rhinitis due to animal (cat) (dog) hair and dander: Secondary | ICD-10-CM | POA: Diagnosis not present

## 2021-02-25 ENCOUNTER — Encounter: Payer: Self-pay | Admitting: Internal Medicine

## 2021-02-28 DIAGNOSIS — J301 Allergic rhinitis due to pollen: Secondary | ICD-10-CM | POA: Diagnosis not present

## 2021-02-28 DIAGNOSIS — J3081 Allergic rhinitis due to animal (cat) (dog) hair and dander: Secondary | ICD-10-CM | POA: Diagnosis not present

## 2021-02-28 DIAGNOSIS — J3089 Other allergic rhinitis: Secondary | ICD-10-CM | POA: Diagnosis not present

## 2021-03-02 ENCOUNTER — Telehealth: Payer: Self-pay | Admitting: Podiatry

## 2021-03-02 NOTE — Telephone Encounter (Signed)
Pt left message asking if we still carried the pumice stones and if so he wanted to come purchase one.  I returned call and we do not have the pumice stones by themselves but we have one that comes with the revitiderm cream. Can pt use that cream being a diabetic?

## 2021-03-04 ENCOUNTER — Encounter: Payer: Self-pay | Admitting: Internal Medicine

## 2021-03-04 ENCOUNTER — Ambulatory Visit: Payer: Medicare Other | Admitting: Internal Medicine

## 2021-03-07 DIAGNOSIS — J3081 Allergic rhinitis due to animal (cat) (dog) hair and dander: Secondary | ICD-10-CM | POA: Diagnosis not present

## 2021-03-07 DIAGNOSIS — J301 Allergic rhinitis due to pollen: Secondary | ICD-10-CM | POA: Diagnosis not present

## 2021-03-07 DIAGNOSIS — J3089 Other allergic rhinitis: Secondary | ICD-10-CM | POA: Diagnosis not present

## 2021-03-08 NOTE — Telephone Encounter (Signed)
Called and spoke with patient's wife and explained that per Dr Prudence Davidson, he is to only use the Pumice stone after bath and shower an do not apply the revitiderm cream. She verbalized understanding.

## 2021-03-14 DIAGNOSIS — J301 Allergic rhinitis due to pollen: Secondary | ICD-10-CM | POA: Diagnosis not present

## 2021-03-14 DIAGNOSIS — J3089 Other allergic rhinitis: Secondary | ICD-10-CM | POA: Diagnosis not present

## 2021-03-14 DIAGNOSIS — J3081 Allergic rhinitis due to animal (cat) (dog) hair and dander: Secondary | ICD-10-CM | POA: Diagnosis not present

## 2021-03-15 ENCOUNTER — Telehealth: Payer: Self-pay | Admitting: Internal Medicine

## 2021-03-15 DIAGNOSIS — Z0000 Encounter for general adult medical examination without abnormal findings: Secondary | ICD-10-CM

## 2021-03-15 DIAGNOSIS — E119 Type 2 diabetes mellitus without complications: Secondary | ICD-10-CM

## 2021-03-15 DIAGNOSIS — N200 Calculus of kidney: Secondary | ICD-10-CM

## 2021-03-15 NOTE — Telephone Encounter (Signed)
Patients wife called and was wondering if labs could be placed before his physical on 03/28/21. The patient can be reached at [phone]. Please advise

## 2021-03-15 NOTE — Telephone Encounter (Signed)
Done. Thx.

## 2021-03-16 NOTE — Telephone Encounter (Signed)
Notified pt MD placed lab orders pt will go to [facility] to have done.Marland KitchenJohny Ayers

## 2021-03-17 ENCOUNTER — Other Ambulatory Visit: Payer: Self-pay

## 2021-03-17 ENCOUNTER — Encounter: Payer: Self-pay | Admitting: Dietician

## 2021-03-17 ENCOUNTER — Encounter: Payer: Medicare Other | Attending: Internal Medicine | Admitting: Dietician

## 2021-03-17 DIAGNOSIS — E119 Type 2 diabetes mellitus without complications: Secondary | ICD-10-CM | POA: Insufficient documentation

## 2021-03-17 NOTE — Progress Notes (Signed)
Diabetes Self-Management Education  Visit Type: First/Initial  Appt. Start Time: 1415 Appt. End Time: 1630  03/17/2021  [name], identified by name and date of birth, is a 70 y.o. male with a diagnosis of Diabetes: Type 2.   ASSESSMENT Patient is here alone.  He would like to learn how to better control his glucose. He saw Mliss Sax, RD about 20 years ago for prediabetes. Quit snacks since seeing Dr. Kelton Pillar with improved blood sugar control requiring decrease of his medication.  History includes Type 2 diabetes (2015), HTN, hyperlipidemia, GERD, history of alcohol abuse A1C 7.6% 01/31/2021 increased from 7.2% 10/15/2020 Medications include Metformin XR and 1/2 Repaglinide before breakfast, lunch, and dinner, vitamin D  Patient lives with his wife.  She does the shopping and cooking. He is a retired Emergency planning/management officer. Weights plus 30 minute cardio each morning and 3-4 mile walk each afternoon.  Height 5\' 10"  (1.778 m), weight 205 lb (93 kg). Body mass index is 29.41 kg/m.   Diabetes Self-Management Education - 03/17/21 1428      Visit Information   Visit Type First/Initial      Initial Visit   Diabetes Type Type 2    Are you currently following a meal plan? No    Are you taking your medications as prescribed? Yes    Date Diagnosed 2015      Health Coping   How would you rate your overall health? Good      Psychosocial Assessment   Patient Belief/Attitude about Diabetes Motivated to manage diabetes    Self-care barriers None    Self-management support Doctor's office;Family    Other persons present Patient    Patient Concerns Nutrition/Meal planning;Glycemic Control    Special Needs None    Preferred Learning Style No preference indicated    Learning Readiness Ready    How often do you need to have someone help you when you read instructions, pamphlets, or other written materials from your doctor or pharmacy? 1 - Never    What is the last grade level you  completed in school? BS      Pre-Education Assessment   Patient understands the diabetes disease and treatment process. Needs Instruction    Patient understands incorporating nutritional management into lifestyle. Needs Instruction    Patient undertands incorporating physical activity into lifestyle. Needs Instruction    Patient understands using medications safely. Needs Instruction    Patient understands monitoring blood glucose, interpreting and using results Needs Instruction    Patient understands prevention, detection, and treatment of acute complications. Needs Instruction    Patient understands prevention, detection, and treatment of chronic complications. Needs Instruction    Patient understands how to develop strategies to address psychosocial issues. Needs Instruction    Patient understands how to develop strategies to promote health/change behavior. Needs Instruction      Complications   Last HgB A1C per patient/outside source 7.6 %   01/31/2021 increased from 7.2% 09/2020   How often do you check your blood sugar? 3-4 times/day    Fasting Blood glucose range (mg/dL) 70-129   112   Number of hypoglycemic episodes per month 2    Can you tell when your blood sugar is low? Yes    What do you do if your blood sugar is low? drank coke    Number of hyperglycemic episodes per week 0    Have you had a dilated eye exam in the past 12 months? Yes    Have you  had a dental exam in the past 12 months? Yes    Are you checking your feet? Yes    How many days per week are you checking your feet? 7      Dietary Intake   Breakfast coffee (black), steel cut oatmeal, 3 egg whites, fruit, unsweetened almond milk    Snack (morning) none    Lunch [place] (3 oz) on Ezekiel bread with lettuce, mayo, sweet pickle, raw vegetables, occasional PB, occasional chips, water or ICE    Snack (afternoon) none    Dinner [employer] pasta or peas or 1/2 potato, skinless chicken breast or pork chop   5:30-6   Snack  (evening) none    Beverage(s) black coffee, almond milk, water, unsweetened tea, occasional diet coke      Exercise   Exercise Type Light (walking / raking leaves)    How many days per week to you exercise? 7    How many minutes per day do you exercise? 100    Total minutes per week of exercise 700      Patient Education   Previous Diabetes Education Yes (please comment)   Tonie Griffith about 20 years ago for prediabetes   Disease state  Definition of diabetes, type 1 and 2, and the diagnosis of diabetes;Explored patient's options for treatment of their diabetes    Nutrition management  Role of diet in the treatment of diabetes and the relationship between the three main macronutrients and blood glucose level;Food label reading, portion sizes and measuring food.;Information on hints to eating out and maintain blood glucose control.;Meal options for control of blood glucose level and chronic complications.;Effects of alcohol on blood glucose and safety factors with consumption of alcohol.    Physical activity and exercise  Role of exercise on diabetes management, blood pressure control and cardiac health.    Medications Reviewed patients medication for diabetes, action, purpose, timing of dose and side effects.    Monitoring Purpose and frequency of SMBG.;Taught/discussed recording of test results and interpretation of SMBG.;Identified appropriate SMBG and/or A1C goals.;Daily foot exams;Yearly dilated eye exam    Acute complications Taught treatment of hypoglycemia - the 15 rule.    Chronic complications Relationship between chronic complications and blood glucose control    Psychosocial adjustment Role of stress on diabetes      Individualized Goals (developed by patient)   Nutrition General guidelines for healthy choices and portions discussed    Physical Activity Exercise 5-7 days per week;60 minutes per day    Medications take my medication as prescribed    Monitoring  test my blood  glucose as discussed    Reducing Risk examine blood glucose patterns;do foot checks daily;treat hypoglycemia with 15 grams of carbs if blood glucose less than 70mg /dL;increase portions of healthy fats    Health Coping discuss diabetes with (comment)   MD, RD, CDCES     Post-Education Assessment   Patient understands the diabetes disease and treatment process. Demonstrates understanding / competency    Patient understands incorporating nutritional management into lifestyle. Demonstrates understanding / competency    Patient undertands incorporating physical activity into lifestyle. Demonstrates understanding / competency    Patient understands using medications safely. Demonstrates understanding / competency    Patient understands monitoring blood glucose, interpreting and using results Demonstrates understanding / competency    Patient understands prevention, detection, and treatment of acute complications. Demonstrates understanding / competency    Patient understands prevention, detection, and treatment of chronic complications. Demonstrates understanding / competency  Patient understands how to develop strategies to address psychosocial issues. Demonstrates understanding / competency    Patient understands how to develop strategies to promote health/change behavior. Demonstrates understanding / competency      Outcomes   Expected Outcomes Demonstrated interest in learning. Expect positive outcomes    Future DMSE PRN    Program Status Completed           Individualized Plan for Diabetes Self-Management Training:   Learning Objective:  Patient will have a greater understanding of diabetes self-management. Patient education plan is to attend individual and/or group sessions per assessed needs and concerns.   Plan:   Patient Instructions  Mindfulness:  Choices  Eating slowly  Stop eating when satisfied  Continue the great changes that you have made! Continue to exercise  daily. Continue to check your blood sugar as prescribed  Consider checking your blood sugar before and 2 hours after a meal.  How much does your blood sugar rise?  Goal rise is about 40-60 points. Continue your medication as prescribed. Stress control     Expected Outcomes:  Demonstrated interest in learning. Expect positive outcomes  Education material provided: ADA - How to Thrive: A Guide for Your Journey with Diabetes, Food label handouts, Meal plan card and Diabetes Resources , Dining out tips with diabetes  If problems or questions, patient to contact team via:  Phone  Future DSME appointment: PRN

## 2021-03-17 NOTE — Patient Instructions (Addendum)
Mindfulness:  Choices  Eating slowly  Stop eating when satisfied  Continue the great changes that you have made! Continue to exercise daily. Continue to check your blood sugar as prescribed  Consider checking your blood sugar before and 2 hours after a meal.  How much does your blood sugar rise?  Goal rise is about 40-60 points. Continue your medication as prescribed. Stress control

## 2021-03-21 DIAGNOSIS — J301 Allergic rhinitis due to pollen: Secondary | ICD-10-CM | POA: Diagnosis not present

## 2021-03-21 DIAGNOSIS — J3081 Allergic rhinitis due to animal (cat) (dog) hair and dander: Secondary | ICD-10-CM | POA: Diagnosis not present

## 2021-03-21 DIAGNOSIS — J3089 Other allergic rhinitis: Secondary | ICD-10-CM | POA: Diagnosis not present

## 2021-03-22 ENCOUNTER — Encounter: Payer: Self-pay | Admitting: Internal Medicine

## 2021-03-22 MED ORDER — FREESTYLE LIBRE 2 SENSOR MISC: 1.0000 | 11 refills | Status: DC

## 2021-03-23 ENCOUNTER — Other Ambulatory Visit (INDEPENDENT_AMBULATORY_CARE_PROVIDER_SITE_OTHER): Payer: Medicare Other

## 2021-03-23 DIAGNOSIS — E119 Type 2 diabetes mellitus without complications: Secondary | ICD-10-CM

## 2021-03-23 DIAGNOSIS — Z0000 Encounter for general adult medical examination without abnormal findings: Secondary | ICD-10-CM

## 2021-03-23 DIAGNOSIS — N200 Calculus of kidney: Secondary | ICD-10-CM

## 2021-03-23 DIAGNOSIS — Z125 Encounter for screening for malignant neoplasm of prostate: Secondary | ICD-10-CM | POA: Diagnosis not present

## 2021-03-23 LAB — CBC WITH DIFFERENTIAL/PLATELET
Basophils Absolute: 0.1 10*3/uL (ref 0.0–0.1)
Basophils Relative: 0.9 % (ref 0.0–3.0)
Eosinophils Absolute: 0.7 10*3/uL (ref 0.0–0.7)
Eosinophils Relative: 9.2 % — ABNORMAL HIGH (ref 0.0–5.0)
HCT: 40.6 % (ref 39.0–52.0)
Hemoglobin: 14 g/dL (ref 13.0–17.0)
Lymphocytes Relative: 39.1 % (ref 12.0–46.0)
Lymphs Abs: 2.8 10*3/uL (ref 0.7–4.0)
MCHC: 34.5 g/dL (ref 30.0–36.0)
MCV: 92.5 fl (ref 78.0–100.0)
Monocytes Absolute: 0.8 10*3/uL (ref 0.1–1.0)
Monocytes Relative: 10.9 % (ref 3.0–12.0)
Neutro Abs: 2.9 10*3/uL (ref 1.4–7.7)
Neutrophils Relative %: 39.9 % — ABNORMAL LOW (ref 43.0–77.0)
Platelets: 203 10*3/uL (ref 150.0–400.0)
RBC: 4.39 Mil/uL (ref 4.22–5.81)
RDW: 12.6 % (ref 11.5–15.5)
WBC: 7.1 10*3/uL (ref 4.0–10.5)

## 2021-03-23 LAB — BASIC METABOLIC PANEL
BUN: 20 mg/dL (ref 6–23)
CO2: 27 mEq/L (ref 19–32)
Calcium: 9.4 mg/dL (ref 8.4–10.5)
Chloride: 103 mEq/L (ref 96–112)
Creatinine, Ser: 1.01 mg/dL (ref 0.40–1.50)
GFR: 76.79 mL/min (ref >60.00)
Glucose, Bld: 142 mg/dL — ABNORMAL HIGH (ref 70–99)
Potassium: 4.4 mEq/L (ref 3.5–5.1)
Sodium: 137 mEq/L (ref 135–145)

## 2021-03-23 LAB — URINALYSIS
Bilirubin Urine: NEGATIVE
Hgb urine dipstick: NEGATIVE
Ketones, ur: NEGATIVE
Leukocytes,Ua: NEGATIVE
Nitrite: NEGATIVE
Specific Gravity, Urine: <=1.005 — AB (ref 1.000–1.030)
Total Protein, Urine: NEGATIVE
Urine Glucose: NEGATIVE
Urobilinogen, UA: 0.2 (ref 0.0–1.0)
pH: 7 (ref 5.0–8.0)

## 2021-03-23 LAB — COMPREHENSIVE METABOLIC PANEL
ALT: 20 U/L (ref 0–53)
AST: 18 U/L (ref 0–37)
Albumin: 4.6 g/dL (ref 3.5–5.2)
Alkaline Phosphatase: 49 U/L (ref 39–117)
BUN: 20 mg/dL (ref 6–23)
CO2: 27 mEq/L (ref 19–32)
Calcium: 9.4 mg/dL (ref 8.4–10.5)
Chloride: 103 mEq/L (ref 96–112)
Creatinine, Ser: 1.01 mg/dL (ref 0.40–1.50)
GFR: 76.79 mL/min (ref >60.00)
Glucose, Bld: 142 mg/dL — ABNORMAL HIGH (ref 70–99)
Potassium: 4.4 mEq/L (ref 3.5–5.1)
Sodium: 137 mEq/L (ref 135–145)
Total Bilirubin: 0.8 mg/dL (ref 0.2–1.2)
Total Protein: 6.5 g/dL (ref 6.0–8.3)

## 2021-03-23 LAB — LIPID PANEL
Cholesterol: 137 mg/dL (ref 0–200)
HDL: 52.1 mg/dL (ref >39.00)
LDL Cholesterol: 68 mg/dL (ref 0–99)
NonHDL: 85.29
Total CHOL/HDL Ratio: 3
Triglycerides: 86 mg/dL (ref 0.0–149.0)
VLDL: 17.2 mg/dL (ref 0.0–40.0)

## 2021-03-23 LAB — MICROALBUMIN / CREATININE URINE RATIO
Creatinine,U: 12.3 mg/dL
Microalb Creat Ratio: 6.4 mg/g (ref 0.0–30.0)
Microalb, Ur: 0.8 mg/dL (ref 0.0–1.9)

## 2021-03-23 LAB — HEMOGLOBIN A1C: Hgb A1c MFr Bld: 6.7 % — ABNORMAL HIGH (ref 4.6–6.5)

## 2021-03-23 LAB — TSH: TSH: 3.55 u[IU]/mL (ref 0.35–4.50)

## 2021-03-23 LAB — PSA: PSA: 0.81 ng/mL (ref 0.10–4.00)

## 2021-03-25 ENCOUNTER — Other Ambulatory Visit: Payer: Self-pay

## 2021-03-28 ENCOUNTER — Ambulatory Visit (INDEPENDENT_AMBULATORY_CARE_PROVIDER_SITE_OTHER): Payer: Medicare Other | Admitting: Internal Medicine

## 2021-03-28 ENCOUNTER — Other Ambulatory Visit: Payer: Self-pay

## 2021-03-28 ENCOUNTER — Encounter: Payer: Self-pay | Admitting: Internal Medicine

## 2021-03-28 DIAGNOSIS — F418 Other specified anxiety disorders: Secondary | ICD-10-CM

## 2021-03-28 DIAGNOSIS — F1021 Alcohol dependence, in remission: Secondary | ICD-10-CM | POA: Diagnosis not present

## 2021-03-28 DIAGNOSIS — Z23 Encounter for immunization: Secondary | ICD-10-CM | POA: Diagnosis not present

## 2021-03-28 DIAGNOSIS — K429 Umbilical hernia without obstruction or gangrene: Secondary | ICD-10-CM

## 2021-03-28 DIAGNOSIS — J3081 Allergic rhinitis due to animal (cat) (dog) hair and dander: Secondary | ICD-10-CM | POA: Diagnosis not present

## 2021-03-28 DIAGNOSIS — E119 Type 2 diabetes mellitus without complications: Secondary | ICD-10-CM | POA: Diagnosis not present

## 2021-03-28 DIAGNOSIS — J301 Allergic rhinitis due to pollen: Secondary | ICD-10-CM | POA: Diagnosis not present

## 2021-03-28 DIAGNOSIS — J3089 Other allergic rhinitis: Secondary | ICD-10-CM | POA: Diagnosis not present

## 2021-03-28 MED ORDER — ROSUVASTATIN CALCIUM 10 MG PO TABS: 10.0000 mg | ORAL_TABLET | Freq: Every day | ORAL | 3 refills | Status: DC

## 2021-03-28 NOTE — Assessment & Plan Note (Signed)
Discussed.

## 2021-03-28 NOTE — Progress Notes (Signed)
Subjective:  Patient ID: Brian Ayers, male    DOB: 1951/09/07  Age: 70 y.o. MRN: [id number]  CC: Annual Exam   HPI Brian Ayers presents for DM, HTN, dyslipidemia  SBP 130 at home  Outpatient Medications Prior to Visit  Medication Sig Dispense Refill  . ADVAIR DISKUS 100-50 MCG/DOSE AEPB INHALE 1 PUFF INTO THE LUNGS TWO TIMES DAILY 180 each 3  . aspirin 81 MG chewable tablet Chew 81 mg by mouth daily.    Marland Kitchen azelastine (ASTELIN) 0.1 % nasal spray USE 1 SPRAY INTO EACH NOSTRIL TWICE A DAY    . Biotin 2500 MCG CAPS Take 2,500 mcg by mouth daily.    . Blood Glucose Monitoring Suppl (ONETOUCH VERIO FLEX SYSTEM) w/Device KIT USE AS DIRECTED TO CHECK BLOOD SUGARS 1 kit 0  . Continuous Blood Gluc Sensor (FREESTYLE LIBRE 2 SENSOR) MISC 1 Device by Does not apply route as directed. 2 each 11  . EPINEPHrine 0.3 mg/0.3 mL IJ SOAJ injection as needed.  0  . famotidine (PEPCID) 20 MG tablet TAKE 1 TABLET BY MOUTH TWICE A DAY 180 tablet 3  . fexofenadine (ALLEGRA) 180 MG tablet Take 180 mg by mouth daily.    . fluticasone (FLONASE) 50 MCG/ACT nasal spray SPRAY 1 SPRAY INTO EACH NOSTRIL EVERY DAY    . glucose blood (ONETOUCH VERIO) test strip 1 each by Other route in the morning, at noon, and at bedtime. Use as instructed 150 each 3  . metFORMIN (GLUCOPHAGE-XR) 500 MG 24 hr tablet Take 2 tablets (1,000 mg total) by mouth 2 (two) times daily. 360 tablet 3  . OneTouch Delica Lancets 40J MISC USE TO CHECK BS TWICE A DAY 100 each 5  . OVER THE COUNTER MEDICATION Saw Palmetto 450 mg, twice daily.    Marland Kitchen OVER THE COUNTER MEDICATION Vitamin D 3 One capsule daily.    Marland Kitchen PROAIR HFA 108 (90 BASE) MCG/ACT inhaler as needed.  0  . repaglinide (PRANDIN) 1 MG tablet Take 1 tablet (1 mg total) by mouth 3 (three) times daily before meals. 90 tablet 5  . rosuvastatin (CRESTOR) 10 MG tablet TAKE 1 TABLET BY MOUTH EVERY DAY 90 tablet 1  . sildenafil (VIAGRA) 100 MG tablet Take 1 tablet (100 mg total) by mouth as  needed for erectile dysfunction. 12 tablet 11  . UNABLE TO FIND Med Name: Takes allergy shots weekly    . valsartan (DIOVAN) 160 MG tablet TAKE 1 TABLET BY MOUTH EVERY DAY 90 tablet 3   No facility-administered medications prior to visit.    ROS: Review of Systems  Constitutional: Negative for appetite change, fatigue and unexpected weight change.  HENT: Negative for congestion, nosebleeds, sneezing, sore throat and trouble swallowing.   Eyes: Negative for itching and visual disturbance.  Respiratory: Negative for cough.   Cardiovascular: Negative for chest pain, palpitations and leg swelling.  Gastrointestinal: Negative for abdominal distention, blood in stool, diarrhea and nausea.  Genitourinary: Negative for frequency and hematuria.  Musculoskeletal: Negative for back pain, gait problem, joint swelling and neck pain.  Skin: Negative for rash.  Neurological: Negative for dizziness, tremors, speech difficulty and weakness.  Psychiatric/Behavioral: Negative for agitation, dysphoric mood, sleep disturbance and suicidal ideas. The patient is not nervous/anxious.     Objective:  BP (!) 144/80 (BP Location: Left Arm)   Pulse (!) 59   Temp 98.8 F (37.1 C) (Oral)   Ht _0  (1.778 m)   Wt 204 lb (92.5 kg)  SpO2 97%   BMI 29.27 kg/m   BP Readings from Last 3 Encounters:  03/28/21 (!) 144/80  02/07/21 140/82  02/03/21 (!) 144/76    Wt Readings from Last 3 Encounters:  03/28/21 204 lb (92.5 kg)  03/17/21 205 lb (93 kg)  02/07/21 208 lb 8 oz (94.6 kg)    Physical Exam Constitutional:      General: He is not in acute distress.    Appearance: He is well-developed.     Comments: NAD  Eyes:     Conjunctiva/sclera: Conjunctivae normal.     Pupils: Pupils are equal, round, and reactive to light.  Neck:     Thyroid: No thyromegaly.     Vascular: No JVD.  Cardiovascular:     Rate and Rhythm: Normal rate and regular rhythm.     Heart sounds: Normal heart sounds. No murmur  heard. No friction rub. No gallop.   Pulmonary:     Effort: Pulmonary effort is normal. No respiratory distress.     Breath sounds: Normal breath sounds. No wheezing or rales.  Chest:     Chest wall: No tenderness.  Abdominal:     General: Bowel sounds are normal. There is no distension.     Palpations: Abdomen is soft. There is no mass.     Tenderness: There is no abdominal tenderness. There is no guarding or rebound.  Musculoskeletal:        General: No tenderness. Normal range of motion.     Cervical back: Normal range of motion.  Lymphadenopathy:     Cervical: No cervical adenopathy.  Skin:    General: Skin is warm and dry.     Findings: No rash.  Neurological:     Mental Status: He is alert and oriented to person, place, and time.     Cranial Nerves: No cranial nerve deficit.     Motor: No abnormal muscle tone.     Coordination: Coordination normal.     Gait: Gait normal.     Deep Tendon Reflexes: Reflexes are normal and symmetric.  Psychiatric:        Behavior: Behavior normal.        Thought Content: Thought content normal.        Judgment: Judgment normal.   umb hernia  Lab Results  Component Value Date   WBC 7.1 03/23/2021   HGB 14.0 03/23/2021   HCT 40.6 03/23/2021   PLT 203.0 03/23/2021   GLUCOSE 142 (H) 03/23/2021   GLUCOSE 142 (H) 03/23/2021   CHOL 137 03/23/2021   TRIG 86.0 03/23/2021   HDL 52.10 03/23/2021   LDLCALC 68 03/23/2021   ALT 20 03/23/2021   AST 18 03/23/2021   NA 137 03/23/2021   NA 137 03/23/2021   K 4.4 03/23/2021   K 4.4 03/23/2021   CL 103 03/23/2021   CL 103 03/23/2021   CREATININE 1.01 03/23/2021   CREATININE 1.01 03/23/2021   BUN 20 03/23/2021   BUN 20 03/23/2021   CO2 27 03/23/2021   CO2 27 03/23/2021   TSH 3.55 03/23/2021   PSA 0.81 03/23/2021   HGBA1C 6.7 (H) 03/23/2021   MICROALBUR 0.8 03/23/2021    CT CARDIAC SCORING  Addendum Date: 11/07/2018   ADDENDUM REPORT: 11/07/2018 17:47 CLINICAL DATA:  Risk  stratification EXAM: Coronary Calcium Score TECHNIQUE: The patient was scanned on a [employer] scanner. Axial non-contrast 3 mm slices were carried out through the heart. The data set was analyzed on a dedicated work station and  scored using the Agatson method. FINDINGS: Non-cardiac: See separate report from [facility]. Ascending Aorta: Normal size, mild diffuse calcifications in the aortic arch and descending aorta. Pericardium: Normal. Coronary arteries: Normal origin. IMPRESSION: Coronary calcium score of 76. This was 34 percentile for age and sex matched control. Electronically Signed   By: Ena Dawley   On: 11/07/2018 17:47   Result Date: 11/07/2018 EXAM: OVER-READ INTERPRETATION  CT CHEST The following report is an over-read performed by radiologist Dr. Rolm Baptise of [facility] on 11/07/2018. This over-read does not include interpretation of cardiac or coronary anatomy or pathology. The coronary calcium score interpretation by the cardiologist is attached. COMPARISON:  None FINDINGS: Vascular: Heart is normal size.  Aorta is normal caliber. Mediastinum/Nodes: No adenopathy in the visualized lower mediastinum or hila. Lungs/Pleura: Visualized lungs clear.  No effusions. Upper Abdomen: Imaging into the upper abdomen shows no acute findings. Musculoskeletal: Chest wall soft tissues are unremarkable. No acute bony abnormality. IMPRESSION: No acute or significant extracardiac abnormality. Electronically Signed: By: Rolm Baptise M.D. On: 11/07/2018 16:51    Assessment & Plan:    Walker Kehr, MD

## 2021-03-28 NOTE — Addendum Note (Signed)
Addended by: Earnstine Regal on: 03/28/2021 11:14 AM   Modules accepted: Orders

## 2021-03-28 NOTE — Assessment & Plan Note (Signed)
Cont w/Prandin and Metformin

## 2021-03-28 NOTE — Assessment & Plan Note (Signed)
Discussed. Not interested in surgery

## 2021-03-29 DIAGNOSIS — L988 Other specified disorders of the skin and subcutaneous tissue: Secondary | ICD-10-CM | POA: Diagnosis not present

## 2021-03-29 DIAGNOSIS — C44612 Basal cell carcinoma of skin of right upper limb, including shoulder: Secondary | ICD-10-CM | POA: Diagnosis not present

## 2021-04-04 DIAGNOSIS — J3089 Other allergic rhinitis: Secondary | ICD-10-CM | POA: Diagnosis not present

## 2021-04-04 DIAGNOSIS — J301 Allergic rhinitis due to pollen: Secondary | ICD-10-CM | POA: Diagnosis not present

## 2021-04-04 DIAGNOSIS — J3081 Allergic rhinitis due to animal (cat) (dog) hair and dander: Secondary | ICD-10-CM | POA: Diagnosis not present

## 2021-04-06 ENCOUNTER — Encounter: Payer: Self-pay | Admitting: Internal Medicine

## 2021-04-11 DIAGNOSIS — J301 Allergic rhinitis due to pollen: Secondary | ICD-10-CM | POA: Diagnosis not present

## 2021-04-11 DIAGNOSIS — J3081 Allergic rhinitis due to animal (cat) (dog) hair and dander: Secondary | ICD-10-CM | POA: Diagnosis not present

## 2021-04-11 DIAGNOSIS — J3089 Other allergic rhinitis: Secondary | ICD-10-CM | POA: Diagnosis not present

## 2021-04-13 ENCOUNTER — Encounter: Payer: Self-pay | Admitting: Internal Medicine

## 2021-04-14 NOTE — Telephone Encounter (Signed)
PLease call this pt and bring him in to see me ASAP . Please remind him to bring his meter   Thanks

## 2021-04-15 NOTE — Telephone Encounter (Signed)
appt made for next Friday 5/6 at 1pm. Pt states he will bring both the meters he has been using

## 2021-04-18 DIAGNOSIS — J301 Allergic rhinitis due to pollen: Secondary | ICD-10-CM | POA: Diagnosis not present

## 2021-04-18 DIAGNOSIS — J3089 Other allergic rhinitis: Secondary | ICD-10-CM | POA: Diagnosis not present

## 2021-04-18 DIAGNOSIS — J3081 Allergic rhinitis due to animal (cat) (dog) hair and dander: Secondary | ICD-10-CM | POA: Diagnosis not present

## 2021-04-22 ENCOUNTER — Encounter: Payer: Self-pay | Admitting: Internal Medicine

## 2021-04-22 ENCOUNTER — Ambulatory Visit (INDEPENDENT_AMBULATORY_CARE_PROVIDER_SITE_OTHER): Payer: Medicare Other | Admitting: Internal Medicine

## 2021-04-22 ENCOUNTER — Other Ambulatory Visit: Payer: Self-pay

## 2021-04-22 VITALS — BP 140/80 | HR 60 | Ht 70.0 in | Wt 206.1 lb

## 2021-04-22 DIAGNOSIS — E119 Type 2 diabetes mellitus without complications: Secondary | ICD-10-CM

## 2021-04-22 MED ORDER — METFORMIN HCL ER 500 MG PO TB24: 1000.0000 mg | ORAL_TABLET | Freq: Two times a day (BID) | ORAL | 3 refills | Status: DC

## 2021-04-22 NOTE — Progress Notes (Signed)
Name: Brian Ayers  Age/ Sex: 70 y.o., male   MRN/ DOB: [id number], 1951/04/21     PCP: Cassandria Anger, MD   Reason for Endocrinology Evaluation: Type 2 Diabetes Mellitus  Initial Endocrine Consultative Visit: 02/07/2021    PATIENT IDENTIFIER: [name] is a 70 y.o. male with a past medical history of  T2DM, Asthma and Dyslipidemia, with Hx of alcohol abuse. The patient has followed with Endocrinology clinic since 02/07/2021 for consultative assistance with management of his diabetes.  DIABETIC HISTORY:  Brian Ayers was diagnosed with DM in 2015. He has been on Metformin, repaglinide started 2/20222  His hemoglobin A1c has ranged from 6.7% in 2019, peaking at 7.6% in 2022.   On his intiial visit he had an A1c of 7.6 %. He was just started on Repaglinide so we continued this until 04/2021 due to recurrent hypoglycemia. We increased Metformin    SUBJECTIVE:   During the last visit (02/07/2021): A1c 7.6% increased metformin and continued repaglinide       Today (04/22/2021): Brian Ayers is here for a follow up on diabetes management.  He checks his blood sugars multiple  times daily, through CGM. The patient has  had hypoglycemic episodes since the last clinic visit, which typically occur 2x / day- most often occuring during the day but this has resolved since stopping the Prandin.  Denies nausea or vomiting  Occasional diarrhea    HOME DIABETES REGIMEN:  Metformin 500 mg 2 tabs BID   Statin: yes ACE-I/ARB: yes     CONTINUOUS GLUCOSE MONITORING RECORD INTERPRETATION    Dates of Recording: 4/23-04/22/2021  Sensor description:freestyle libre  Results statistics:   CGM use % of time 99  Average and SD 115/18.1  Time in range   99     %  % Time Above 180 1  % Time above 250 0  % Time Below target 0   Glycemic patterns summary: optimal glycose during the day and night   Hyperglycemic episodes  N/A  Hypoglycemic episodes occurred N/A recently    Overnight periods: optimal        DIABETIC COMPLICATIONS: Microvascular complications:    Denies: CKD, retinopathy, neuropathy   Last Eye Exam: Completed   Macrovascular complications:    Denies: CAD, CVA, PVD   HISTORY:  Past Medical History:  Past Medical History:  Diagnosis Date  . Alcohol abuse    hixtory of  . Allergy   . Asthma childhood  . Cancer ([place])    skin cancer on face X1  . Cataract   . Chronic kidney disease   . Diabetes mellitus without complication ([place])   . GERD (gastroesophageal reflux disease)   . History of kidney stones   . Hx of colonic polyps   . Hyperlipidemia   . Rhinitis    on allery shots   Past Surgical History:  Past Surgical History:  Procedure Laterality Date  . CATARACT EXTRACTION Left   . EXTRACORPOREAL SHOCK WAVE LITHOTRIPSY Right 03/14/2018   Procedure: RIGHT EXTRACORPOREAL SHOCK WAVE LITHOTRIPSY (ESWL);  Surgeon: Raynelle Bring, MD;  Location: [facility];  Service: Urology;  Laterality: Right;  . hernia with hydrocele  1973  . left shoulder pin setting  1978  . TONSILLECTOMY  1967  . WISDOM TOOTH EXTRACTION  2004    Social History:  reports that he quit smoking about 35 years ago. He quit smokeless tobacco use about 35 years ago.  His smokeless tobacco use included snuff and chew.  He reports that he does not drink alcohol and does not use drugs. Family History:  Family History  Problem Relation Age of Onset  . Cancer Mother        breast (hx of)  . Other Mother        trigeminal neuralgia s/p gamma knife/ CHF  . Hypertension Father   . Diabetes Neg Hx   . Colon cancer Neg Hx   . Stomach cancer Neg Hx   . Rectal cancer Neg Hx      HOME MEDICATIONS: Allergies as of 04/22/2021      Reactions   Pseudoephedrine    REACTION: tachycardia   Simvastatin    GERD      Medication List       Accurate as of Apr 22, 2021  1:15 PM. If you have any questions, ask your nurse or doctor.        Advair Diskus 100-50  MCG/ACT Aepb Generic drug: fluticasone-salmeterol INHALE 1 PUFF INTO THE LUNGS TWO TIMES DAILY   aspirin 81 MG chewable tablet Chew 81 mg by mouth daily.   azelastine 0.1 % nasal spray Commonly known as: ASTELIN USE 1 SPRAY INTO EACH NOSTRIL TWICE A DAY   Biotin 2500 MCG Caps Take 2,500 mcg by mouth daily.   EPINEPHrine 0.3 mg/0.3 mL Soaj injection Commonly known as: EPI-PEN as needed.   famotidine 20 MG tablet Commonly known as: PEPCID TAKE 1 TABLET BY MOUTH TWICE A DAY   fexofenadine 180 MG tablet Commonly known as: ALLEGRA Take 180 mg by mouth daily.   fluticasone 50 MCG/ACT nasal spray Commonly known as: FLONASE SPRAY 1 SPRAY INTO EACH NOSTRIL EVERY DAY   FreeStyle Libre 2 Sensor Misc 1 Device by Does not apply route as directed.   metFORMIN 500 MG 24 hr tablet Commonly known as: GLUCOPHAGE-XR Take 2 tablets (1,000 mg total) by mouth 2 (two) times daily.   OneTouch Delica Lancets 12A Misc USE TO CHECK BS TWICE A DAY   OneTouch Verio Flex System w/Device Kit USE AS DIRECTED TO CHECK BLOOD SUGARS   OneTouch Verio test strip Generic drug: glucose blood 1 each by Other route in the morning, at noon, and at bedtime. Use as instructed   OVER THE COUNTER MEDICATION Saw Palmetto 450 mg, twice daily.   OVER THE COUNTER MEDICATION Vitamin D 3 One capsule daily.   ProAir HFA 108 (90 Base) MCG/ACT inhaler Generic drug: albuterol as needed.   repaglinide 1 MG tablet Commonly known as: Prandin Take 1 tablet (1 mg total) by mouth 3 (three) times daily before meals.   rosuvastatin 10 MG tablet Commonly known as: CRESTOR Take 1 tablet (10 mg total) by mouth daily.   sildenafil 100 MG tablet Commonly known as: Viagra Take 1 tablet (100 mg total) by mouth as needed for erectile dysfunction.   UNABLE TO FIND Med Name: Takes allergy shots weekly   valsartan 160 MG tablet Commonly known as: DIOVAN TAKE 1 TABLET BY MOUTH EVERY DAY        OBJECTIVE:    Vital Signs: BP 140/80   Pulse 60   Ht _0  (1.778 m)   Wt 206 lb 2 oz (93.5 kg)   SpO2 98%   BMI 29.58 kg/m   Wt Readings from Last 3 Encounters:  04/22/21 206 lb 2 oz (93.5 kg)  03/28/21 204 lb (92.5 kg)  03/17/21 205 lb (93 kg)     Exam: General: Pt appears well and is in NAD  Lungs: Clear with good BS bilat with no rales, rhonchi, or wheezes  Heart: RRR with normal S1 and S2 and no gallops; no murmurs; no rub  Abdomen: Normoactive bowel sounds, soft, nontender, without masses or organomegaly palpable  Extremities: No pretibial edema.   Neuro: MS is good with appropriate affect, pt is alert and Ox3    DM foot exam:   04/22/2021  The skin of the feet is intact without sores or ulcerations. The pedal pulses are 2+ on right and 2+ on left. The sensation is intact to a screening 5.07, 10 gram monofilament bilaterally    DATA REVIEWED:  Lab Results  Component Value Date   HGBA1C 6.7 (H) 03/23/2021   HGBA1C 7.6 (H) 01/31/2021   HGBA1C 7.2 (H) 10/15/2020   Lab Results  Component Value Date   MICROALBUR 0.8 03/23/2021   LDLCALC 68 03/23/2021   CREATININE 1.01 03/23/2021   CREATININE 1.01 03/23/2021   Lab Results  Component Value Date   MICRALBCREAT 6.4 03/23/2021     Lab Results  Component Value Date   CHOL 137 03/23/2021   HDL 52.10 03/23/2021   LDLCALC 68 03/23/2021   TRIG 86.0 03/23/2021   CHOLHDL 3 03/23/2021         ASSESSMENT / PLAN / RECOMMENDATIONS:   1) Type 2 Diabetes Mellitus, Optimally controlled, With out complications - Most recent A1c of 6.7 %. Goal A1c < 7.0 %.     - I have praised the pt on weight loss and optimizing glucose  - Hypoglycemia resolved since discontinuation of repaglinide - Will continue on metformin     MEDICATIONS:  Continue  Metformin 500 mg, Two tablets with Breakfast and two tablets with Supper   EDUCATION / INSTRUCTIONS:  BG monitoring instructions: Patient is instructed to check his blood sugars 3  times a day, before meals .  Call [facility] if: BG persistently < 70  . I reviewed the Rule of 15 for the treatment of hypoglycemia in detail with the patient. Literature supplied.     2) Diabetic complications:   Eye: Does not have known diabetic retinopathy.   Neuro/ Feet: Does not have known diabetic peripheral neuropathy .   Renal: Patient does not have known baseline CKD. He   is  on an ACEI/ARB at present.      F/U in 4 months    Signed electronically by: Mack Guise, MD  [facility]  [facility] [address] [place],  [address] Phone: [phone] FAX: [phone]   CC: Cassandria Anger, MD [place] [address] Phone: [phone]  Fax: [phone]  Return to Endocrinology clinic as below: Future Appointments  Date Time Provider [place]  05/11/2021  8:50 AM Gagandeep Pettet, Melanie Crazier, MD [facility] None  09/28/2021 10:20 AM Plotnikov, Evie Lacks, MD [facility] None  12/02/2021 10:00 AM Gardiner Barefoot, DPM [facility]

## 2021-04-22 NOTE — Patient Instructions (Addendum)
-   Keep up the Good Work ! - Continue  Metformin 500 mg, Two tablets with Breakfast and two tablets with Supper     HOW TO TREAT LOW BLOOD SUGARS (Blood sugar LESS THAN 70 MG/DL)  Please follow the RULE OF 15 for the treatment of hypoglycemia treatment (when your (blood sugars are less than 70 mg/dL)    STEP 1: Take 15 grams of carbohydrates when your blood sugar is low, which includes:   3-4 GLUCOSE TABS  OR  3-4 OZ OF JUICE OR REGULAR SODA OR  ONE TUBE OF GLUCOSE GEL     STEP 2: RECHECK blood sugar in 15 MINUTES STEP 3: If your blood sugar is still low at the 15 minute recheck --> then, go back to STEP 1 and treat AGAIN with another 15 grams of carbohydrates.

## 2021-04-25 DIAGNOSIS — J301 Allergic rhinitis due to pollen: Secondary | ICD-10-CM | POA: Diagnosis not present

## 2021-04-25 DIAGNOSIS — J3081 Allergic rhinitis due to animal (cat) (dog) hair and dander: Secondary | ICD-10-CM | POA: Diagnosis not present

## 2021-04-25 DIAGNOSIS — J3089 Other allergic rhinitis: Secondary | ICD-10-CM | POA: Diagnosis not present

## 2021-04-27 ENCOUNTER — Encounter: Payer: Self-pay | Admitting: Internal Medicine

## 2021-04-29 ENCOUNTER — Other Ambulatory Visit: Payer: Self-pay

## 2021-04-29 ENCOUNTER — Other Ambulatory Visit (HOSPITAL_BASED_OUTPATIENT_CLINIC_OR_DEPARTMENT_OTHER): Payer: Self-pay

## 2021-04-29 ENCOUNTER — Ambulatory Visit: Payer: Medicare Other | Attending: Internal Medicine

## 2021-04-29 DIAGNOSIS — Z23 Encounter for immunization: Secondary | ICD-10-CM

## 2021-04-29 MED ORDER — COVID-19 MRNA VACC (MODERNA) 100 MCG/0.5ML IM SUSP
INTRAMUSCULAR | 0 refills | Status: DC
Start: 2021-04-29 — End: 2021-10-19
  Filled 2021-04-29: qty 0.25, 1d supply, fill #0

## 2021-04-29 NOTE — Progress Notes (Signed)
   Covid-19 Vaccination Clinic  Name:  Brian Ayers    MRN: [id number] DOB: 1951-10-13  04/29/2021  [name] was observed post Covid-19 immunization for 15 minutes without incident. He was provided with Vaccine Information Sheet and instruction to access the V-Safe system.   [name] was instructed to call 911 with any severe reactions post vaccine: Marland Kitchen Difficulty breathing  . Swelling of face and throat  . A fast heartbeat  . A bad rash all over body  . Dizziness and weakness   Immunizations Administered    Name Date Dose VIS Date Route   Moderna Covid-19 Booster Vaccine 04/29/2021  2:11 PM 0.25 mL 10/06/2020 Intramuscular   Manufacturer: Moderna   Lot: [id number]   [place]: 46503-546-56

## 2021-05-02 DIAGNOSIS — J3089 Other allergic rhinitis: Secondary | ICD-10-CM | POA: Diagnosis not present

## 2021-05-02 DIAGNOSIS — J3081 Allergic rhinitis due to animal (cat) (dog) hair and dander: Secondary | ICD-10-CM | POA: Diagnosis not present

## 2021-05-02 DIAGNOSIS — J301 Allergic rhinitis due to pollen: Secondary | ICD-10-CM | POA: Diagnosis not present

## 2021-05-09 DIAGNOSIS — J301 Allergic rhinitis due to pollen: Secondary | ICD-10-CM | POA: Diagnosis not present

## 2021-05-09 DIAGNOSIS — J3089 Other allergic rhinitis: Secondary | ICD-10-CM | POA: Diagnosis not present

## 2021-05-09 DIAGNOSIS — J3081 Allergic rhinitis due to animal (cat) (dog) hair and dander: Secondary | ICD-10-CM | POA: Diagnosis not present

## 2021-05-11 ENCOUNTER — Ambulatory Visit: Payer: Medicare Other | Admitting: Internal Medicine

## 2021-05-17 DIAGNOSIS — J3089 Other allergic rhinitis: Secondary | ICD-10-CM | POA: Diagnosis not present

## 2021-05-17 DIAGNOSIS — J3081 Allergic rhinitis due to animal (cat) (dog) hair and dander: Secondary | ICD-10-CM | POA: Diagnosis not present

## 2021-05-17 DIAGNOSIS — J301 Allergic rhinitis due to pollen: Secondary | ICD-10-CM | POA: Diagnosis not present

## 2021-05-18 ENCOUNTER — Telehealth: Payer: Self-pay | Admitting: Internal Medicine

## 2021-05-18 NOTE — Telephone Encounter (Signed)
LVM for pt to rtn my call to r/s appt with [facility] on 05/23/21. Please r/s appt if pt calls the office.

## 2021-05-23 ENCOUNTER — Ambulatory Visit: Payer: Medicare Other

## 2021-05-23 DIAGNOSIS — J301 Allergic rhinitis due to pollen: Secondary | ICD-10-CM | POA: Diagnosis not present

## 2021-05-23 DIAGNOSIS — J3081 Allergic rhinitis due to animal (cat) (dog) hair and dander: Secondary | ICD-10-CM | POA: Diagnosis not present

## 2021-05-23 DIAGNOSIS — J3089 Other allergic rhinitis: Secondary | ICD-10-CM | POA: Diagnosis not present

## 2021-05-24 ENCOUNTER — Telehealth: Payer: Self-pay | Admitting: Podiatry

## 2021-05-24 NOTE — Telephone Encounter (Signed)
Pt left message and wanted to know about some exorcize shoes for diabetic how he could come look at them.  I also spoke to pts wife and we had scheduled pt to come in 6.13 to discuss diabetic shoes since it was not documented in the office note that pt would benefit from them but pts wife  talked with pt and he did not want to come in for an appt. I told her I would put it in the note for the yrly visit in December that pt maybe interested in diabetic shoes.

## 2021-05-30 ENCOUNTER — Ambulatory Visit: Payer: Medicare Other | Admitting: Podiatry

## 2021-05-30 DIAGNOSIS — J3081 Allergic rhinitis due to animal (cat) (dog) hair and dander: Secondary | ICD-10-CM | POA: Diagnosis not present

## 2021-05-30 DIAGNOSIS — J301 Allergic rhinitis due to pollen: Secondary | ICD-10-CM | POA: Diagnosis not present

## 2021-05-30 DIAGNOSIS — J3089 Other allergic rhinitis: Secondary | ICD-10-CM | POA: Diagnosis not present

## 2021-05-31 ENCOUNTER — Ambulatory Visit (INDEPENDENT_AMBULATORY_CARE_PROVIDER_SITE_OTHER): Payer: Medicare Other

## 2021-05-31 ENCOUNTER — Other Ambulatory Visit: Payer: Self-pay

## 2021-05-31 VITALS — BP 140/80 | HR 65 | Temp 98.2°F | Ht 70.0 in | Wt 205.2 lb

## 2021-05-31 DIAGNOSIS — Z0000 Encounter for general adult medical examination without abnormal findings: Secondary | ICD-10-CM | POA: Diagnosis not present

## 2021-05-31 NOTE — Progress Notes (Addendum)
Subjective:   Brian Ayers is a 70 y.o. male who presents for Medicare Annual/Subsequent preventive examination.  Review of Systems     Cardiac Risk Factors include: advanced age (>48mn, >>56women);diabetes mellitus;dyslipidemia;family history of premature cardiovascular disease;hypertension;male gender     Objective:    Today's Vitals   05/31/21 1528 05/31/21 1541  BP:  140/80  Pulse:  65  Temp:  98.2 F (36.8 C)  SpO2:  97%  Weight:  205 lb 3.2 oz (93.1 kg)  Height:  '5\' 10"'  (1.778 m)  PainSc: 0-No pain 0-No pain   Body mass index is 29.44 kg/m.  Advanced Directives 05/31/2021 03/17/2021 07/15/2018 03/14/2018 12/02/2017  Does Patient Have a Medical Advance Directive? Yes Yes Yes Yes Yes  Type of Advance Directive Living will;Healthcare Power of [place] Living will;Healthcare Power of Attorney  Does patient want to make changes to medical advance directive? No - Patient declined - No - Patient declined No - Patient declined -  Copy of [place] Chart? No - copy requested - - No - copy requested -    Current Medications (verified) Outpatient Encounter Medications as of 05/31/2021  Medication Sig   ADVAIR DISKUS 100-50 MCG/DOSE AEPB INHALE 1 PUFF INTO THE LUNGS TWO TIMES DAILY   aspirin 81 MG chewable tablet Chew 81 mg by mouth daily.   azelastine (ASTELIN) 0.1 % nasal spray USE 1 SPRAY INTO EACH NOSTRIL TWICE A DAY   Biotin 2500 MCG CAPS Take 2,500 mcg by mouth daily.   Blood Glucose Monitoring Suppl (ONETOUCH VERIO FLEX SYSTEM) w/Device KIT USE AS DIRECTED TO CHECK BLOOD SUGARS   Continuous Blood Gluc Sensor (FREESTYLE LIBRE 2 SENSOR) MISC 1 Device by Does not apply route as directed.   COVID-19 mRNA vaccine, Moderna, 100 MCG/0.5ML injection Inject into the muscle.   EPINEPHrine 0.3 mg/0.3 mL IJ SOAJ injection as needed.   famotidine (PEPCID) 20 MG tablet TAKE 1 TABLET BY MOUTH TWICE A DAY    fexofenadine (ALLEGRA) 180 MG tablet Take 180 mg by mouth daily.   fluticasone (FLONASE) 50 MCG/ACT nasal spray SPRAY 1 SPRAY INTO EACH NOSTRIL EVERY DAY   glucose blood (ONETOUCH VERIO) test strip 1 each by Other route in the morning, at noon, and at bedtime. Use as instructed   metFORMIN (GLUCOPHAGE-XR) 500 MG 24 hr tablet Take 2 tablets (1,000 mg total) by mouth 2 (two) times daily.   OneTouch Delica Lancets 387OMISC USE TO CHECK BS TWICE A DAY   OVER THE COUNTER MEDICATION Saw Palmetto 450 mg, twice daily.   OVER THE COUNTER MEDICATION Vitamin D 3 One capsule daily.   PROAIR HFA 108 (90 BASE) MCG/ACT inhaler as needed.   rosuvastatin (CRESTOR) 10 MG tablet Take 1 tablet (10 mg total) by mouth daily.   sildenafil (VIAGRA) 100 MG tablet Take 1 tablet (100 mg total) by mouth as needed for erectile dysfunction.   UNABLE TO FIND Med Name: Takes allergy shots weekly   valsartan (DIOVAN) 160 MG tablet TAKE 1 TABLET BY MOUTH EVERY DAY   No facility-administered encounter medications on file as of 05/31/2021.    Allergies (verified) Pseudoephedrine and Simvastatin   History: Past Medical History:  Diagnosis Date   Alcohol abuse    hixtory of   Allergy    Asthma childhood   Cancer ([place]    skin cancer on face X1   Cataract    Chronic kidney disease  Diabetes mellitus without complication (HCC)    GERD (gastroesophageal reflux disease)    History of kidney stones    Hx of colonic polyps    Hyperlipidemia    Rhinitis    on allery shots   Past Surgical History:  Procedure Laterality Date   CATARACT EXTRACTION Left    EXTRACORPOREAL SHOCK WAVE LITHOTRIPSY Right 03/14/2018   Procedure: RIGHT EXTRACORPOREAL SHOCK WAVE LITHOTRIPSY (ESWL);  Surgeon: Raynelle Bring, MD;  Location: [facility];  Service: Urology;  Laterality: Right;   hernia with hydrocele  1973   left shoulder pin setting  [place] EXTRACTION  2004   Family History  Problem Relation Age  of Onset   Cancer Mother        breast (hx of)   Other Mother        trigeminal neuralgia s/p gamma knife/ CHF   Hypertension Father    Diabetes Neg Hx    Colon cancer Neg Hx    Stomach cancer Neg Hx    Rectal cancer Neg Hx    Social History   Socioeconomic History   Marital status: Married    Spouse name: Not on file   Number of children: 0   Years of education: Not on file   Highest education level: Not on file  Occupational History   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 Marriage is in good health. 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      Regular exercise: daily/walk at gym   Caffeine use: 2 cups of coffee daily         Social Determinants of Health   Financial Resource Strain: Low Risk    Difficulty of Paying Living Expenses: Not hard at all  Food Insecurity: No Food Insecurity   Worried About Charity fundraiser in the Last Year: Never true   Ran Out of Food in the Last Year: Never true  Transportation Needs: No Transportation Needs   Lack of Transportation (Medical): No   Lack of Transportation (Non-Medical): No  Physical Activity: Sufficiently Active   Days of Exercise per Week: 5 days   Minutes of Exercise per Session: 30 min  Stress: No Stress Concern Present   Feeling of Stress : Not at all  Social Connections: Socially Integrated   Frequency of Communication with Friends and Family: More than three times a week    Frequency of Social Gatherings with Friends and Family: More than three times a week   Attends Religious Services: More than 4 times per year   Active Member of [employer] or Organizations: Yes   Attends Music therapist: More than 4 times per year   Marital Status: Married    Tobacco Counseling Counseling given: Not Answered Tobacco comments: only for a  short period in the 1980's   Clinical Intake:  Pre-visit preparation completed: Yes  Pain : No/denies pain Pain Score: 0-No pain     BMI - recorded: 29.44 Nutritional Status: BMI 25 -29 Overweight Nutritional Risks: None Diabetes: Yes (Patient sees endocrinologist: Mammie Lorenzo "Abby" Nena Jordan, MD) CBG done?: No Did pt. bring in CBG monitor from home?: No  How often do you need to have someone help you when you read instructions, pamphlets, or other written materials from your doctor or pharmacy?: 1 - Never What is the last grade level you completed in school?: Bachelor's Degree  Diabetic: yes  Interpreter Needed?: No  Information entered by :: Lisette Abu, LPN   Activities of Daily Living In your present state of health, do you have any difficulty performing the following activities: 05/31/2021  Hearing? N  Vision? N  Difficulty concentrating or making decisions? N  Walking or climbing stairs? N  Dressing or bathing? N  Doing errands, shopping? N  Preparing Food and eating ? N  Using the Toilet? N  In the past six months, have you accidently leaked urine? N  Do you have problems with loss of bowel control? N  Managing your Medications? N  Managing your Finances? N  Housekeeping or managing your Housekeeping? N  Some recent data might be hidden    Patient Care Team: Plotnikov, Evie Lacks, MD as PCP - General (Internal Medicine) Raynelle Bring, MD as Consulting Physician (Urology) [facility], Melanie Crazier, MD as Consulting Physician (Endocrinology)  Indicate any recent Medical Services  you may have received from other than [facility] providers in the past year (date may be approximate).     Assessment:   This is a routine wellness examination for Brian Ayers.  Hearing/Vision screen No results found.  Dietary issues and exercise activities discussed: Current Exercise Habits: Home exercise routine, Type of exercise: walking, Time (Minutes): 30, Frequency (Times/Week): 5, Weekly Exercise (Minutes/Week): 150, Intensity: Moderate, Exercise limited by: respiratory conditions(s)   Goals Addressed               This Visit's Progress     Client will verbalize knowledge of diabetes self-management as evidenced by Hgb A1C <7 or as defined by provider. (pt-stated)        My goal is to get rid of diabetes by watching my diet, sugar & carbohydrate intake.       Depression Screen PHQ 2/9 Scores 05/31/2021 03/28/2021 03/17/2021 11/18/2019 07/18/2018 07/03/2017  PHQ - 2 Score 0 0 0 0 0 0  PHQ- 9 Score - 0 - - - -    Fall Risk Fall Risk  05/31/2021 03/17/2021 11/18/2019 07/18/2018  Falls in the past year? 0 0 0 Yes  Number falls in past yr: 0 - - 1  Injury with Fall? 0 - - No  Risk for fall due to : No Fall Risks - - -  Follow up Falls evaluation completed - Falls evaluation completed -    FALL RISK PREVENTION PERTAINING TO THE HOME:  Any stairs in or around the home? Yes  If so, are there any without handrails? No  Home free of loose throw rugs in walkways, pet beds, electrical cords, etc? Yes  Adequate lighting in your home to reduce risk of falls? Yes   ASSISTIVE DEVICES UTILIZED TO PREVENT FALLS:  Life alert? No  Use of a cane, walker or w/c? No  Grab bars in the bathroom? No  Shower chair or bench in shower? No  Elevated toilet  seat or a handicapped toilet? No   TIMED UP AND GO:  Was the test performed? Yes .  Length of time to ambulate 10 feet: 7 sec.   Gait steady and fast without use of assistive device  Cognitive Function:        Immunizations Immunization History   Administered Date(s) Administered   Fluad Quad(high Dose 65+) 10/01/2019, 10/06/2020   Influenza Split 09/28/2011, 10/08/2012   Influenza Whole 10/09/2008, 10/12/2009, 10/10/2010   Influenza, High Dose Seasonal PF 10/14/2018   Influenza,inj,Quad PF,6+ Mos 10/08/2013, 09/09/2014, 10/08/2015, 10/09/2016, 10/08/2017   Moderna SARS-COV2 Booster Vaccination 10/31/2020, 04/29/2021   Moderna Sars-Covid-2 Vaccination 01/11/2020, 02/16/2020, 10/31/2020   Pneumococcal Conjugate-13 06/29/2015, 03/28/2021   Pneumococcal Polysaccharide-23 05/26/2014, 07/15/2019   Td 07/19/1998, 06/08/2009   Tdap 07/15/2019   Zoster, Live 06/09/2014    TDAP status: Up to date  Flu Vaccine status: Up to date  Pneumococcal vaccine status: Up to date  Covid-19 vaccine status: Completed vaccines  Qualifies for Shingles Vaccine? Yes   Zostavax completed Yes   Shingrix Completed?: No.    Education has been provided regarding the importance of this vaccine. Patient has been advised to call insurance company to determine out of pocket expense if they have not yet received this vaccine. Advised may also receive vaccine at local pharmacy or Health Dept. Verbalized acceptance and understanding.  Screening Tests Health Maintenance  Topic Date Due   Zoster Vaccines- Shingrix (1 of 2) Never done   INFLUENZA VACCINE  07/18/2021   HEMOGLOBIN A1C  09/22/2021   OPHTHALMOLOGY EXAM  10/28/2021   FOOT EXAM  11/17/2021   COLONOSCOPY (Pts 45-39yr Insurance coverage will need to be confirmed)  03/03/2022   TETANUS/TDAP  07/14/2029   COVID-19 Vaccine  Completed   Hepatitis C Screening  Completed   PNA vac Low Risk Adult  Completed   HPV VACCINES  Aged Out    Health Maintenance  Health Maintenance Due  Topic Date Due   Zoster Vaccines- Shingrix (1 of 2) Never done    Colorectal cancer screening: Type of screening: Colonoscopy. Completed 03/04/2019. Repeat every 3 years  Lung Cancer Screening: (Low Dose CT Chest  recommended if Age 338-80years, 30 pack-year currently smoking OR have quit w/in 15years.) does not qualify.   Lung Cancer Screening Referral: no  Additional Screening:  Hepatitis C Screening: does qualify; Completed yes  Vision Screening: Recommended annual ophthalmology exams for early detection of glaucoma and other disorders of the eye. Is the patient up to date with their annual eye exam?  Yes  Who is the provider or what is the name of the office in which the patient attends annual eye exams? [facility] is not [facility] with a provider, would they like to be referred to a provider to establish care? No .   Dental Screening: Recommended annual dental exams for proper oral hygiene  Community Resource Referral / Chronic Care Management: CRR required this visit?  No   CCM required this visit?  No      Plan:     I have personally reviewed and noted the following in the patient's chart:   Medical and social history Use of alcohol, tobacco or illicit drugs  Current medications and supplements including opioid prescriptions. Patient is not currently taking opioid prescriptions. Functional ability and status Nutritional status Physical activity Advanced directives List of other physicians Hospitalizations, surgeries, and ER visits in previous 12 months Vitals Screenings to include  cognitive, depression, and falls Referrals and appointments  In addition, I have reviewed and discussed with patient certain preventive protocols, quality metrics, and best practice recommendations. A written personalized care plan for preventive services as well as general preventive health recommendations were provided to patient.     Sheral Flow, LPN   [id number]   Nurse Notes: n/a  Medical screening examination/treatment/procedure(s) were performed by non-physician practitioner and as supervising physician I was immediately available for  consultation/collaboration.  I agree with above. Lew Dawes, MD

## 2021-05-31 NOTE — Patient Instructions (Signed)
[name] , Thank you for taking time to come for your Medicare Wellness Visit. I appreciate your ongoing commitment to your health goals. Please review the following plan we discussed and let me know if I can assist you in the future.   Screening recommendations/referrals: Colonoscopy: 03/04/2019; due every 3 years Recommended yearly ophthalmology/optometry visit for glaucoma screening and checkup Recommended yearly dental visit for hygiene and checkup  Vaccinations: Influenza vaccine: 10/06/2020 Pneumococcal vaccine: 07/15/2019, 03/28/2021 Tdap vaccine: 07/15/2019; due every 10 years Shingles vaccine: Please call your insurance company to determine your out of pocket expense for the Shingrix vaccine. You may receive this vaccine at your local pharmacy. Covid-19: 01/11/2020, 02/16/2020, 10/31/2020, 04/19/2021  Advanced directives: Please bring a copy of your health care power of attorney and living will to the office at your convenience.  Conditions/risks identified: My goal is to get rid of diabetes. Watching my diet and sugar/carbohydrate intake.  Next appointment: Please schedule your next Medicare Wellness Visit with your Nurse Health Advisor in 1 year by calling [phone].  Preventive Care 40 Years and Older, Male Preventive care refers to lifestyle choices and visits with your health care provider that can promote health and wellness. What does preventive care include? A yearly physical exam. This is also called an annual well check. Dental exams once or twice a year. Routine eye exams. Ask your health care provider how often you should have your eyes checked. Personal lifestyle choices, including: Daily care of your teeth and gums. Regular physical activity. Eating a healthy diet. Avoiding tobacco and drug use. Limiting alcohol use. Practicing safe sex. Taking low doses of aspirin every day. Taking vitamin and mineral supplements as recommended by your health care  provider. What happens during an annual well check? The services and screenings done by your health care provider during your annual well check will depend on your age, overall health, lifestyle risk factors, and family history of disease. Counseling  Your health care provider may ask you questions about your: Alcohol use. Tobacco use. Drug use. Emotional well-being. Home and relationship well-being. Sexual activity. Eating habits. History of falls. Memory and ability to understand (cognition). Work and work Statistician. Screening  You may have the following tests or measurements: Height, weight, and BMI. Blood pressure. Lipid and cholesterol levels. These may be checked every 5 years, or more frequently if you are over 50 years old. Skin check. Lung cancer screening. You may have this screening every year starting at age 34 if you have a 30-pack-year history of smoking and currently smoke or have quit within the past 15 years. Fecal occult blood test (FOBT) of the stool. You may have this test every year starting at age 20. Flexible sigmoidoscopy or colonoscopy. You may have a sigmoidoscopy every 5 years or a colonoscopy every 10 years starting at age 83. Prostate cancer screening. Recommendations will vary depending on your family history and other risks. Hepatitis C blood test. Hepatitis B blood test. Sexually transmitted disease (STD) testing. Diabetes screening. This is done by checking your blood sugar (glucose) after you have not eaten for a while (fasting). You may have this done every 1-3 years. Abdominal aortic aneurysm (AAA) screening. You may need this if you are a current or former smoker. Osteoporosis. You may be screened starting at age 70 if you are at high risk. Talk with your health care provider about your test results, treatment options, and if necessary, the need for more tests. Vaccines  Your health care provider may  recommend certain vaccines, such  as: Influenza vaccine. This is recommended every year. Tetanus, diphtheria, and acellular pertussis (Tdap, Td) vaccine. You may need a Td booster every 10 years. Zoster vaccine. You may need this after age 53. Pneumococcal 13-valent conjugate (PCV13) vaccine. One dose is recommended after age 55. Pneumococcal polysaccharide (PPSV23) vaccine. One dose is recommended after age 31. Talk to your health care provider about which screenings and vaccines you need and how often you need them. This information is not intended to replace advice given to you by your health care provider. Make sure you discuss any questions you have with your health care provider. Document Released: 12/31/2015 Document Revised: 08/23/2016 Document Reviewed: 10/05/2015 Elsevier Interactive Patient Education  2017 [place] Prevention in the Home Falls can cause injuries. They can happen to people of all ages. There are many things you can do to make your home safe and to help prevent falls. What can I do on the outside of my home? Regularly fix the edges of walkways and driveways and fix any cracks. Remove anything that might make you trip as you walk through a door, such as a raised step or threshold. Trim any bushes or trees on the path to your home. Use bright outdoor lighting. Clear any walking paths of anything that might make someone trip, such as rocks or tools. Regularly check to see if handrails are loose or broken. Make sure that both sides of any steps have handrails. Any raised decks and porches should have guardrails on the edges. Have any leaves, snow, or ice cleared regularly. Use sand or salt on walking paths during winter. Clean up any spills in your garage right away. This includes oil or grease spills. What can I do in the bathroom? Use night lights. Install grab bars by the toilet and in the tub and shower. Do not use towel bars as grab bars. Use non-skid mats or decals in the tub or  shower. If you need to sit down in the shower, use a plastic, non-slip stool. Keep the floor dry. Clean up any water that spills on the floor as soon as it happens. Remove soap buildup in the tub or shower regularly. Attach bath mats securely with double-sided non-slip rug tape. Do not have throw rugs and other things on the floor that can make you trip. What can I do in the bedroom? Use night lights. Make sure that you have a light by your bed that is easy to reach. Do not use any sheets or blankets that are too big for your bed. They should not hang down onto the floor. Have a firm chair that has side arms. You can use this for support while you get dressed. Do not have throw rugs and other things on the floor that can make you trip. What can I do in the kitchen? Clean up any spills right away. Avoid walking on wet floors. Keep items that you use a lot in easy-to-reach places. If you need to reach something above you, use a strong step stool that has a grab bar. Keep electrical cords out of the way. Do not use floor polish or wax that makes floors slippery. If you must use wax, use non-skid floor wax. Do not have throw rugs and other things on the floor that can make you trip. What can I do with my stairs? Do not leave any items on the stairs. Make sure that there are handrails on both sides of  the stairs and use them. Fix handrails that are broken or loose. Make sure that handrails are as long as the stairways. Check any carpeting to make sure that it is firmly attached to the stairs. Fix any carpet that is loose or worn. Avoid having throw rugs at the top or bottom of the stairs. If you do have throw rugs, attach them to the floor with carpet tape. Make sure that you have a light switch at the top of the stairs and the bottom of the stairs. If you do not have them, ask someone to add them for you. What else can I do to help prevent falls? Wear shoes that: Do not have high heels. Have  rubber bottoms. Are comfortable and fit you well. Are closed at the toe. Do not wear sandals. If you use a stepladder: Make sure that it is fully opened. Do not climb a closed stepladder. Make sure that both sides of the stepladder are locked into place. Ask someone to hold it for you, if possible. Clearly mark and make sure that you can see: Any grab bars or handrails. First and last steps. Where the edge of each step is. Use tools that help you move around (mobility aids) if they are needed. These include: Canes. Walkers. Scooters. Crutches. Turn on the lights when you go into a dark area. Replace any light bulbs as soon as they burn out. Set up your furniture so you have a clear path. Avoid moving your furniture around. If any of your floors are uneven, fix them. If there are any pets around you, be aware of where they are. Review your medicines with your doctor. Some medicines can make you feel dizzy. This can increase your chance of falling. Ask your doctor what other things that you can do to help prevent falls. This information is not intended to replace advice given to you by your health care provider. Make sure you discuss any questions you have with your health care provider. Document Released: 09/30/2009 Document Revised: 05/11/2016 Document Reviewed: 01/08/2015 Elsevier Interactive Patient Education  2017 [employer].

## 2021-06-06 DIAGNOSIS — J3089 Other allergic rhinitis: Secondary | ICD-10-CM | POA: Diagnosis not present

## 2021-06-06 DIAGNOSIS — J301 Allergic rhinitis due to pollen: Secondary | ICD-10-CM | POA: Diagnosis not present

## 2021-06-06 DIAGNOSIS — J3081 Allergic rhinitis due to animal (cat) (dog) hair and dander: Secondary | ICD-10-CM | POA: Diagnosis not present

## 2021-06-13 DIAGNOSIS — J3081 Allergic rhinitis due to animal (cat) (dog) hair and dander: Secondary | ICD-10-CM | POA: Diagnosis not present

## 2021-06-13 DIAGNOSIS — J301 Allergic rhinitis due to pollen: Secondary | ICD-10-CM | POA: Diagnosis not present

## 2021-06-13 DIAGNOSIS — J3089 Other allergic rhinitis: Secondary | ICD-10-CM | POA: Diagnosis not present

## 2021-06-15 ENCOUNTER — Telehealth: Payer: Self-pay | Admitting: Internal Medicine

## 2021-06-15 NOTE — Telephone Encounter (Signed)
   Patient called and said that he got his booster on 04-29-21 but is needing his shingles shot. He was wondering how long he needed to wait. Please advise

## 2021-06-16 NOTE — Telephone Encounter (Signed)
8 weeks Thx

## 2021-06-16 NOTE — Telephone Encounter (Signed)
Notified pt w/MD response.../lmb 

## 2021-06-21 DIAGNOSIS — J301 Allergic rhinitis due to pollen: Secondary | ICD-10-CM | POA: Diagnosis not present

## 2021-06-21 DIAGNOSIS — J3081 Allergic rhinitis due to animal (cat) (dog) hair and dander: Secondary | ICD-10-CM | POA: Diagnosis not present

## 2021-06-21 DIAGNOSIS — J3089 Other allergic rhinitis: Secondary | ICD-10-CM | POA: Diagnosis not present

## 2021-06-23 DIAGNOSIS — J3081 Allergic rhinitis due to animal (cat) (dog) hair and dander: Secondary | ICD-10-CM | POA: Diagnosis not present

## 2021-06-23 DIAGNOSIS — J301 Allergic rhinitis due to pollen: Secondary | ICD-10-CM | POA: Diagnosis not present

## 2021-06-23 DIAGNOSIS — J3089 Other allergic rhinitis: Secondary | ICD-10-CM | POA: Diagnosis not present

## 2021-06-27 DIAGNOSIS — J3089 Other allergic rhinitis: Secondary | ICD-10-CM | POA: Diagnosis not present

## 2021-06-27 DIAGNOSIS — J301 Allergic rhinitis due to pollen: Secondary | ICD-10-CM | POA: Diagnosis not present

## 2021-06-27 DIAGNOSIS — J3081 Allergic rhinitis due to animal (cat) (dog) hair and dander: Secondary | ICD-10-CM | POA: Diagnosis not present

## 2021-06-28 ENCOUNTER — Encounter: Payer: Self-pay | Admitting: Internal Medicine

## 2021-07-01 DIAGNOSIS — H35412 Lattice degeneration of retina, left eye: Secondary | ICD-10-CM | POA: Diagnosis not present

## 2021-07-01 DIAGNOSIS — Z961 Presence of intraocular lens: Secondary | ICD-10-CM | POA: Diagnosis not present

## 2021-07-01 DIAGNOSIS — E119 Type 2 diabetes mellitus without complications: Secondary | ICD-10-CM | POA: Diagnosis not present

## 2021-07-01 DIAGNOSIS — H31012 Macula scars of posterior pole (postinflammatory) (post-traumatic), left eye: Secondary | ICD-10-CM | POA: Diagnosis not present

## 2021-07-01 DIAGNOSIS — H43812 Vitreous degeneration, left eye: Secondary | ICD-10-CM | POA: Diagnosis not present

## 2021-07-01 DIAGNOSIS — H2511 Age-related nuclear cataract, right eye: Secondary | ICD-10-CM | POA: Diagnosis not present

## 2021-07-01 LAB — HM DIABETES EYE EXAM

## 2021-07-03 ENCOUNTER — Other Ambulatory Visit: Payer: Self-pay | Admitting: Internal Medicine

## 2021-07-04 DIAGNOSIS — J3089 Other allergic rhinitis: Secondary | ICD-10-CM | POA: Diagnosis not present

## 2021-07-04 DIAGNOSIS — J3081 Allergic rhinitis due to animal (cat) (dog) hair and dander: Secondary | ICD-10-CM | POA: Diagnosis not present

## 2021-07-04 DIAGNOSIS — J301 Allergic rhinitis due to pollen: Secondary | ICD-10-CM | POA: Diagnosis not present

## 2021-07-06 ENCOUNTER — Other Ambulatory Visit: Payer: Self-pay | Admitting: Internal Medicine

## 2021-07-06 ENCOUNTER — Telehealth: Payer: Self-pay

## 2021-07-06 NOTE — Telephone Encounter (Signed)
LVM instructing pt to call back to discuss appt for Shingrix.  Pt has medicare & vaccine in office will be approx $350.

## 2021-07-07 ENCOUNTER — Ambulatory Visit: Payer: Medicare Other

## 2021-07-11 DIAGNOSIS — J3089 Other allergic rhinitis: Secondary | ICD-10-CM | POA: Diagnosis not present

## 2021-07-11 DIAGNOSIS — J301 Allergic rhinitis due to pollen: Secondary | ICD-10-CM | POA: Diagnosis not present

## 2021-07-11 DIAGNOSIS — J3081 Allergic rhinitis due to animal (cat) (dog) hair and dander: Secondary | ICD-10-CM | POA: Diagnosis not present

## 2021-07-18 DIAGNOSIS — J3081 Allergic rhinitis due to animal (cat) (dog) hair and dander: Secondary | ICD-10-CM | POA: Diagnosis not present

## 2021-07-18 DIAGNOSIS — J301 Allergic rhinitis due to pollen: Secondary | ICD-10-CM | POA: Diagnosis not present

## 2021-07-18 DIAGNOSIS — J3089 Other allergic rhinitis: Secondary | ICD-10-CM | POA: Diagnosis not present

## 2021-07-24 ENCOUNTER — Other Ambulatory Visit: Payer: Self-pay | Admitting: Internal Medicine

## 2021-07-25 DIAGNOSIS — J3081 Allergic rhinitis due to animal (cat) (dog) hair and dander: Secondary | ICD-10-CM | POA: Diagnosis not present

## 2021-07-25 DIAGNOSIS — J3089 Other allergic rhinitis: Secondary | ICD-10-CM | POA: Diagnosis not present

## 2021-07-25 DIAGNOSIS — J301 Allergic rhinitis due to pollen: Secondary | ICD-10-CM | POA: Diagnosis not present

## 2021-08-01 DIAGNOSIS — J3089 Other allergic rhinitis: Secondary | ICD-10-CM | POA: Diagnosis not present

## 2021-08-01 DIAGNOSIS — J3081 Allergic rhinitis due to animal (cat) (dog) hair and dander: Secondary | ICD-10-CM | POA: Diagnosis not present

## 2021-08-01 DIAGNOSIS — J301 Allergic rhinitis due to pollen: Secondary | ICD-10-CM | POA: Diagnosis not present

## 2021-08-08 DIAGNOSIS — J3081 Allergic rhinitis due to animal (cat) (dog) hair and dander: Secondary | ICD-10-CM | POA: Diagnosis not present

## 2021-08-08 DIAGNOSIS — J301 Allergic rhinitis due to pollen: Secondary | ICD-10-CM | POA: Diagnosis not present

## 2021-08-08 DIAGNOSIS — J3089 Other allergic rhinitis: Secondary | ICD-10-CM | POA: Diagnosis not present

## 2021-08-15 DIAGNOSIS — J301 Allergic rhinitis due to pollen: Secondary | ICD-10-CM | POA: Diagnosis not present

## 2021-08-15 DIAGNOSIS — J3081 Allergic rhinitis due to animal (cat) (dog) hair and dander: Secondary | ICD-10-CM | POA: Diagnosis not present

## 2021-08-15 DIAGNOSIS — J3089 Other allergic rhinitis: Secondary | ICD-10-CM | POA: Diagnosis not present

## 2021-08-23 DIAGNOSIS — J3089 Other allergic rhinitis: Secondary | ICD-10-CM | POA: Diagnosis not present

## 2021-08-23 DIAGNOSIS — J301 Allergic rhinitis due to pollen: Secondary | ICD-10-CM | POA: Diagnosis not present

## 2021-08-23 DIAGNOSIS — J3081 Allergic rhinitis due to animal (cat) (dog) hair and dander: Secondary | ICD-10-CM | POA: Diagnosis not present

## 2021-08-26 ENCOUNTER — Encounter: Payer: Self-pay | Admitting: Internal Medicine

## 2021-08-26 ENCOUNTER — Ambulatory Visit (INDEPENDENT_AMBULATORY_CARE_PROVIDER_SITE_OTHER): Payer: Medicare Other | Admitting: Internal Medicine

## 2021-08-26 ENCOUNTER — Other Ambulatory Visit: Payer: Self-pay

## 2021-08-26 VITALS — BP 150/80 | HR 78 | Ht 70.0 in | Wt 202.4 lb

## 2021-08-26 DIAGNOSIS — E119 Type 2 diabetes mellitus without complications: Secondary | ICD-10-CM

## 2021-08-26 LAB — BASIC METABOLIC PANEL
BUN: 21 mg/dL (ref 6–23)
CO2: 26 mEq/L (ref 19–32)
Calcium: 9.3 mg/dL (ref 8.4–10.5)
Chloride: 105 mEq/L (ref 96–112)
Creatinine, Ser: 1.06 mg/dL (ref 0.40–1.50)
GFR: 72.24 mL/min (ref >60.00)
Glucose, Bld: 110 mg/dL — ABNORMAL HIGH (ref 70–99)
Potassium: 4.3 mEq/L (ref 3.5–5.1)
Sodium: 140 mEq/L (ref 135–145)

## 2021-08-26 LAB — POCT GLYCOSYLATED HEMOGLOBIN (HGB A1C): Hemoglobin A1C: 6.3 % — AB (ref 4.0–5.6)

## 2021-08-26 MED ORDER — DAPAGLIFLOZIN PROPANEDIOL 5 MG PO TABS: 5.0000 mg | ORAL_TABLET | Freq: Every day | ORAL | 6 refills | Status: DC

## 2021-08-26 NOTE — Patient Instructions (Signed)
-   STOP Repaglinide - Continue  Metformin 500 mg, Two tablets with Breakfast and two tablets with Supper - Start Farxiga 5 mg, 1 tablet daily     HOW TO TREAT LOW BLOOD SUGARS (Blood sugar LESS THAN 70 MG/DL) Please follow the RULE OF 15 for the treatment of hypoglycemia treatment (when your (blood sugars are less than 70 mg/dL)   STEP 1: Take 15 grams of carbohydrates when your blood sugar is low, which includes:  3-4 GLUCOSE TABS  OR 3-4 OZ OF JUICE OR REGULAR SODA OR ONE TUBE OF GLUCOSE GEL    STEP 2: RECHECK blood sugar in 15 MINUTES STEP 3: If your blood sugar is still low at the 15 minute recheck --> then, go back to STEP 1 and treat AGAIN with another 15 grams of carbohydrates.

## 2021-08-26 NOTE — Progress Notes (Signed)
Name: Brian Ayers  Age/ Sex: 70 y.o., male   MRN/ DOB: [id number], 06-Aug-1951     PCP: Cassandria Anger, MD   Reason for Endocrinology Evaluation: Type 2 Diabetes Mellitus  Initial Endocrine Consultative Visit: 02/07/2021    PATIENT IDENTIFIER: [name] is a 70 y.o. male with a past medical history of  T2DM, Asthma and Dyslipidemia, with Hx of alcohol abuse. The patient has followed with Endocrinology clinic since 02/07/2021 for consultative assistance with management of his diabetes.  DIABETIC HISTORY:  Mr. Schnick was diagnosed with DM in 2015. He has been on Metformin, repaglinide started 2/20222  His hemoglobin A1c has ranged from 6.7% in 2019, peaking at 7.6% in 2022.   On his intiial visit he had an A1c of 7.6 %. He was just started on Repaglinide so we continued this until 04/2021 due to recurrent hypoglycemia. We increased Metformin    Restarted Repaglinide in 06/2021 due to hyperglycemia with Bg's in 200's.   SUBJECTIVE:   During the last visit (04/22/2021): A1c 6.7% Continued metformin       Today (08/26/2021): [name] is here for a follow up on diabetes management.  He checks his blood sugars multiple  times daily, through CGM. The patient has not had hypoglycemic episodes .  He was restarted on Regaglinide in 06/2021 with hyperglycemia  Denies nausea or vomiting but has occasional loose stools in the morning    HOME DIABETES REGIMEN:  Metformin 500 mg 2 tabs BID  Repaglinide 1 mg before Supper     Statin: yes ACE-I/ARB: yes     CONTINUOUS GLUCOSE MONITORING RECORD INTERPRETATION    Dates of Recording: 8/27-08/26/2021  Sensor description:freestyle libre  Results statistics:   CGM use % of time 77  Average and SD 119/16.9  Time in range   99     %  % Time Above 180 1  % Time above 250 0  % Time Below target 0   Glycemic patterns summary: optimal glycose during the day and night   Hyperglycemic episodes  N/A  Hypoglycemic  episodes occurred N/A recently   Overnight periods: optimal        DIABETIC COMPLICATIONS: Microvascular complications:  Posterior vitreous detachment but no DR Denies: CKD, neuropathy  Last Eye Exam: Completed 06/2021  Macrovascular complications:   Denies: CAD, CVA, PVD   HISTORY:  Past Medical History:  Past Medical History:  Diagnosis Date   Alcohol abuse    hixtory of   Allergy    Asthma childhood   Cancer ([place])    skin cancer on face X1   Cataract    Chronic kidney disease    Diabetes mellitus without complication (HCC)    GERD (gastroesophageal reflux disease)    History of kidney stones    Hx of colonic polyps    Hyperlipidemia    Rhinitis    on allery shots   Past Surgical History:  Past Surgical History:  Procedure Laterality Date   CATARACT EXTRACTION Left    EXTRACORPOREAL SHOCK WAVE LITHOTRIPSY Right 03/14/2018   Procedure: RIGHT EXTRACORPOREAL SHOCK WAVE LITHOTRIPSY (ESWL);  Surgeon: Raynelle Bring, MD;  Location: [facility];  Service: Urology;  Laterality: Right;   hernia with hydrocele  1973   left shoulder pin setting  [place] EXTRACTION  2004   Social History:  reports that he has quit smoking. He quit smokeless tobacco use about 35 years ago.  His smokeless  tobacco use included snuff and chew. He reports that he does not drink alcohol and does not use drugs. Family History:  Family History  Problem Relation Age of Onset   Cancer Mother        breast (hx of)   Other Mother        trigeminal neuralgia s/p gamma knife/ CHF   Hypertension Father    Diabetes Neg Hx    Colon cancer Neg Hx    Stomach cancer Neg Hx    Rectal cancer Neg Hx      HOME MEDICATIONS: Allergies as of 08/26/2021       Reactions   Pseudoephedrine    REACTION: tachycardia   Simvastatin    GERD        Medication List        Accurate as of August 26, 2021  1:15 PM. If you have any questions, ask your nurse or doctor.           Advair Diskus 100-50 MCG/ACT Aepb Generic drug: fluticasone-salmeterol INHALE 1 PUFF INTO THE LUNGS TWO TIMES DAILY   aspirin 81 MG chewable tablet Chew 81 mg by mouth daily.   azelastine 0.1 % nasal spray Commonly known as: ASTELIN USE 1 SPRAY INTO EACH NOSTRIL TWICE A DAY   Biotin 2500 MCG Caps Take 2,500 mcg by mouth daily.   EPINEPHrine 0.3 mg/0.3 mL Soaj injection Commonly known as: EPI-PEN as needed.   famotidine 20 MG tablet Commonly known as: PEPCID TAKE 1 TABLET BY MOUTH TWICE A DAY   fexofenadine 180 MG tablet Commonly known as: ALLEGRA Take 180 mg by mouth daily.   fluticasone 50 MCG/ACT nasal spray Commonly known as: FLONASE SPRAY 1 SPRAY INTO EACH NOSTRIL EVERY DAY   FreeStyle Libre 2 Sensor Misc 1 Device by Does not apply route as directed.   metFORMIN 500 MG 24 hr tablet Commonly known as: GLUCOPHAGE-XR Take 2 tablets (1,000 mg total) by mouth 2 (two) times daily.   Moderna COVID-19 Vaccine 100 MCG/0.5ML injection Generic drug: COVID-19 mRNA vaccine (Moderna) Inject into the muscle.   OneTouch Delica Lancets 26O Misc USE TO CHECK BS TWICE A DAY   OneTouch Verio Flex System w/Device Kit USE AS DIRECTED TO CHECK BLOOD SUGARS   OneTouch Verio test strip Generic drug: glucose blood USE AS INSTRUCTED   OVER THE COUNTER MEDICATION Saw Palmetto 450 mg, twice daily.   OVER THE COUNTER MEDICATION Vitamin D 3 One capsule daily.   ProAir HFA 108 (90 Base) MCG/ACT inhaler Generic drug: albuterol as needed.   rosuvastatin 10 MG tablet Commonly known as: CRESTOR Take 1 tablet (10 mg total) by mouth daily.   sildenafil 100 MG tablet Commonly known as: Viagra Take 1 tablet (100 mg total) by mouth as needed for erectile dysfunction.   UNABLE TO FIND Med Name: Takes allergy shots weekly   valsartan 160 MG tablet Commonly known as: DIOVAN TAKE 1 TABLET BY MOUTH EVERY DAY         OBJECTIVE:   Vital Signs: BP (!) 150/80 (BP  Location: Left Arm, Patient Position: Sitting, Cuff Size: Small)   Pulse 78   Ht $R'5\' 10"'DD$  (1.778 m)   Wt 202 lb 6.4 oz (91.8 kg)   SpO2 99%   BMI 29.04 kg/m   Wt Readings from Last 3 Encounters:  08/26/21 202 lb 6.4 oz (91.8 kg)  05/31/21 205 lb 3.2 oz (93.1 kg)  04/22/21 206 lb 2 oz (93.5 kg)     Exam:  General: Pt appears well and is in NAD  Lungs: Clear with good BS bilat with no rales, rhonchi, or wheezes  Heart: RRR with normal S1 and S2 and no gallops; no murmurs; no rub  Abdomen: Normoactive bowel sounds, soft, nontender, without masses or organomegaly palpable  Extremities: No pretibial edema.   Neuro: MS is good with appropriate affect, pt is alert and Ox3    DM foot exam:   04/22/2021  The skin of the feet is intact without sores or ulcerations. The pedal pulses are 2+ on right and 2+ on left. The sensation is intact to a screening 5.07, 10 gram monofilament bilaterally    DATA REVIEWED:  Lab Results  Component Value Date   HGBA1C 6.3 (A) 08/26/2021   HGBA1C 6.7 (H) 03/23/2021   HGBA1C 7.6 (H) 01/31/2021   Lab Results  Component Value Date   MICROALBUR 0.8 03/23/2021   LDLCALC 68 03/23/2021   CREATININE 1.01 03/23/2021   CREATININE 1.01 03/23/2021   Lab Results  Component Value Date   MICRALBCREAT 6.4 03/23/2021     Lab Results  Component Value Date   CHOL 137 03/23/2021   HDL 52.10 03/23/2021   LDLCALC 68 03/23/2021   TRIG 86.0 03/23/2021   CHOLHDL 3 03/23/2021       Results for JERRIE, GULLO (MRN [id number]) as of 08/26/2021 17:16  Ref. Range 08/26/2021 13:51  Sodium Latest Ref Range: 135 - 145 mEq/L 140  Potassium Latest Ref Range: 3.5 - 5.1 mEq/L 4.3  Chloride Latest Ref Range: 96 - 112 mEq/L 105  CO2 Latest Ref Range: 19 - 32 mEq/L 26  Glucose Latest Ref Range: 70 - 99 mg/dL 110 (H)  BUN Latest Ref Range: 6 - 23 mg/dL 21  Creatinine Latest Ref Range: 0.40 - 1.50 mg/dL 1.06  Calcium Latest Ref Range: 8.4 - 10.5 mg/dL 9.3  GFR Latest Ref  Range: >60.00 mL/min 72.24    ASSESSMENT / PLAN / RECOMMENDATIONS:   1) Type 2 Diabetes Mellitus, Optimally controlled, Without complications - Most recent A1c of 6.3 %. Goal A1c < 7.0 %.     - I have praised the pt on weight loss and optimizing glucose but given his A1c is 6.3 % , I am concerned about hypoglycemia with repaglinide.  - We discussed SGLT-2 inhibitors, cautioned against genital infections  -BMP is normal today    MEDICATIONS: Continue  Metformin 500 mg, Two tablets with Breakfast and two tablets with Supper Start Farxiga 5 mg , 1 tablet daily  Stop Repaglinide   EDUCATION / INSTRUCTIONS: BG monitoring instructions: Patient is instructed to check his blood sugars 3 times a day, before meals . Call [facility] if: BG persistently < 70  I reviewed the Rule of 15 for the treatment of hypoglycemia in detail with the patient. Literature supplied.    2) Diabetic complications:  Eye: Does not have known diabetic retinopathy.  Neuro/ Feet: Does not have known diabetic peripheral neuropathy .  Renal: Patient does not have known baseline CKD. He   is  on an ACEI/ARB at present.      F/U in 4 months    Signed electronically by: Mack Guise, MD  [facility]  [facility] [address] [place], [place] [address] Phone: [phone] FAX: [phone]   CC: Cassandria Anger, MD [place] [address] Phone: [phone]  Fax: [phone]  Return to Endocrinology clinic as below: Future Appointments  Date  Time Provider [place]  08/26/2021  1:20 PM Lisbet Busker, Melanie Crazier, MD [facility] None  10/19/2021 10:00 AM Plotnikov, Evie Lacks, MD [facility] None  12/02/2021 10:00 AM Gardiner Barefoot, DPM [facility]

## 2021-08-29 DIAGNOSIS — J3081 Allergic rhinitis due to animal (cat) (dog) hair and dander: Secondary | ICD-10-CM | POA: Diagnosis not present

## 2021-08-29 DIAGNOSIS — J301 Allergic rhinitis due to pollen: Secondary | ICD-10-CM | POA: Diagnosis not present

## 2021-08-29 DIAGNOSIS — J3089 Other allergic rhinitis: Secondary | ICD-10-CM | POA: Diagnosis not present

## 2021-09-05 DIAGNOSIS — J3089 Other allergic rhinitis: Secondary | ICD-10-CM | POA: Diagnosis not present

## 2021-09-05 DIAGNOSIS — J3081 Allergic rhinitis due to animal (cat) (dog) hair and dander: Secondary | ICD-10-CM | POA: Diagnosis not present

## 2021-09-05 DIAGNOSIS — J301 Allergic rhinitis due to pollen: Secondary | ICD-10-CM | POA: Diagnosis not present

## 2021-09-07 ENCOUNTER — Encounter: Payer: Self-pay | Admitting: Internal Medicine

## 2021-09-12 DIAGNOSIS — J3089 Other allergic rhinitis: Secondary | ICD-10-CM | POA: Diagnosis not present

## 2021-09-12 DIAGNOSIS — J301 Allergic rhinitis due to pollen: Secondary | ICD-10-CM | POA: Diagnosis not present

## 2021-09-12 DIAGNOSIS — J3081 Allergic rhinitis due to animal (cat) (dog) hair and dander: Secondary | ICD-10-CM | POA: Diagnosis not present

## 2021-09-17 ENCOUNTER — Encounter: Payer: Self-pay | Admitting: Internal Medicine

## 2021-09-18 ENCOUNTER — Encounter: Payer: Self-pay | Admitting: Internal Medicine

## 2021-09-19 DIAGNOSIS — J3081 Allergic rhinitis due to animal (cat) (dog) hair and dander: Secondary | ICD-10-CM | POA: Diagnosis not present

## 2021-09-19 DIAGNOSIS — J301 Allergic rhinitis due to pollen: Secondary | ICD-10-CM | POA: Diagnosis not present

## 2021-09-19 DIAGNOSIS — J3089 Other allergic rhinitis: Secondary | ICD-10-CM | POA: Diagnosis not present

## 2021-09-23 ENCOUNTER — Other Ambulatory Visit (HOSPITAL_BASED_OUTPATIENT_CLINIC_OR_DEPARTMENT_OTHER): Payer: Self-pay

## 2021-09-23 ENCOUNTER — Other Ambulatory Visit: Payer: Self-pay

## 2021-09-23 ENCOUNTER — Ambulatory Visit: Payer: Medicare Other | Attending: Internal Medicine

## 2021-09-23 DIAGNOSIS — Z23 Encounter for immunization: Secondary | ICD-10-CM

## 2021-09-23 MED ORDER — MODERNA COVID-19 BIVAL BOOSTER 50 MCG/0.5ML IM SUSP
INTRAMUSCULAR | 0 refills | Status: DC
  Filled 2021-09-23: qty 0.5, 1d supply, fill #0

## 2021-09-23 NOTE — Progress Notes (Signed)
   Covid-19 Vaccination Clinic  Name:  Brian Ayers    MRN: [id number] DOB: Aug 23, 1951  09/23/2021  [name] was observed post Covid-19 immunization for 15 minutes without incident. He was provided with Vaccine Information Sheet and instruction to access the V-Safe system.   [name] was instructed to call 911 with any severe reactions post vaccine: Difficulty breathing  Swelling of face and throat  A fast heartbeat  A bad rash all over body  Dizziness and weakness

## 2021-09-26 DIAGNOSIS — J3081 Allergic rhinitis due to animal (cat) (dog) hair and dander: Secondary | ICD-10-CM | POA: Diagnosis not present

## 2021-09-26 DIAGNOSIS — J3089 Other allergic rhinitis: Secondary | ICD-10-CM | POA: Diagnosis not present

## 2021-09-26 DIAGNOSIS — J301 Allergic rhinitis due to pollen: Secondary | ICD-10-CM | POA: Diagnosis not present

## 2021-09-27 ENCOUNTER — Other Ambulatory Visit: Payer: Self-pay | Admitting: Internal Medicine

## 2021-09-28 ENCOUNTER — Encounter: Payer: Self-pay | Admitting: Internal Medicine

## 2021-09-28 ENCOUNTER — Ambulatory Visit: Payer: Medicare Other | Admitting: Internal Medicine

## 2021-09-29 MED ORDER — CLOTRIMAZOLE-BETAMETHASONE 1-0.05 % EX CREA: 1.0000 "application " | TOPICAL_CREAM | Freq: Two times a day (BID) | CUTANEOUS | 0 refills | Status: DC

## 2021-10-03 ENCOUNTER — Other Ambulatory Visit (INDEPENDENT_AMBULATORY_CARE_PROVIDER_SITE_OTHER): Payer: Medicare Other

## 2021-10-03 DIAGNOSIS — J301 Allergic rhinitis due to pollen: Secondary | ICD-10-CM | POA: Diagnosis not present

## 2021-10-03 DIAGNOSIS — E119 Type 2 diabetes mellitus without complications: Secondary | ICD-10-CM | POA: Diagnosis not present

## 2021-10-03 DIAGNOSIS — J3089 Other allergic rhinitis: Secondary | ICD-10-CM | POA: Diagnosis not present

## 2021-10-03 DIAGNOSIS — J3081 Allergic rhinitis due to animal (cat) (dog) hair and dander: Secondary | ICD-10-CM | POA: Diagnosis not present

## 2021-10-03 LAB — BASIC METABOLIC PANEL
BUN: 24 mg/dL — ABNORMAL HIGH (ref 6–23)
CO2: 23 mEq/L (ref 19–32)
Calcium: 9.1 mg/dL (ref 8.4–10.5)
Chloride: 105 mEq/L (ref 96–112)
Creatinine, Ser: 0.97 mg/dL (ref 0.40–1.50)
GFR: 80.3 mL/min (ref >60.00)
Glucose, Bld: 133 mg/dL — ABNORMAL HIGH (ref 70–99)
Potassium: 4.1 mEq/L (ref 3.5–5.1)
Sodium: 138 mEq/L (ref 135–145)

## 2021-10-03 LAB — HEMOGLOBIN A1C: Hgb A1c MFr Bld: 6.6 % — ABNORMAL HIGH (ref 4.6–6.5)

## 2021-10-05 DIAGNOSIS — J3089 Other allergic rhinitis: Secondary | ICD-10-CM | POA: Diagnosis not present

## 2021-10-05 DIAGNOSIS — J301 Allergic rhinitis due to pollen: Secondary | ICD-10-CM | POA: Diagnosis not present

## 2021-10-05 DIAGNOSIS — J3081 Allergic rhinitis due to animal (cat) (dog) hair and dander: Secondary | ICD-10-CM | POA: Diagnosis not present

## 2021-10-06 ENCOUNTER — Ambulatory Visit: Payer: Medicare Other

## 2021-10-06 ENCOUNTER — Other Ambulatory Visit (HOSPITAL_BASED_OUTPATIENT_CLINIC_OR_DEPARTMENT_OTHER): Payer: Self-pay

## 2021-10-06 MED ORDER — INFLUENZA VAC A&B SA ADJ QUAD 0.5 ML IM PRSY
PREFILLED_SYRINGE | INTRAMUSCULAR | 0 refills | Status: DC
  Filled 2021-10-06: qty 0.5, 1d supply, fill #0

## 2021-10-10 DIAGNOSIS — J3089 Other allergic rhinitis: Secondary | ICD-10-CM | POA: Diagnosis not present

## 2021-10-10 DIAGNOSIS — J301 Allergic rhinitis due to pollen: Secondary | ICD-10-CM | POA: Diagnosis not present

## 2021-10-10 DIAGNOSIS — J3081 Allergic rhinitis due to animal (cat) (dog) hair and dander: Secondary | ICD-10-CM | POA: Diagnosis not present

## 2021-10-17 DIAGNOSIS — J3089 Other allergic rhinitis: Secondary | ICD-10-CM | POA: Diagnosis not present

## 2021-10-17 DIAGNOSIS — J3081 Allergic rhinitis due to animal (cat) (dog) hair and dander: Secondary | ICD-10-CM | POA: Diagnosis not present

## 2021-10-17 DIAGNOSIS — J301 Allergic rhinitis due to pollen: Secondary | ICD-10-CM | POA: Diagnosis not present

## 2021-10-19 ENCOUNTER — Other Ambulatory Visit: Payer: Self-pay

## 2021-10-19 ENCOUNTER — Encounter: Payer: Self-pay | Admitting: Internal Medicine

## 2021-10-19 ENCOUNTER — Ambulatory Visit (INDEPENDENT_AMBULATORY_CARE_PROVIDER_SITE_OTHER): Payer: Medicare Other | Admitting: Internal Medicine

## 2021-10-19 VITALS — BP 130/72 | HR 63 | Temp 98.3°F | Ht 70.0 in | Wt 197.0 lb

## 2021-10-19 DIAGNOSIS — Z23 Encounter for immunization: Secondary | ICD-10-CM

## 2021-10-19 DIAGNOSIS — E119 Type 2 diabetes mellitus without complications: Secondary | ICD-10-CM | POA: Diagnosis not present

## 2021-10-19 DIAGNOSIS — K219 Gastro-esophageal reflux disease without esophagitis: Secondary | ICD-10-CM

## 2021-10-19 DIAGNOSIS — E785 Hyperlipidemia, unspecified: Secondary | ICD-10-CM | POA: Diagnosis not present

## 2021-10-19 NOTE — Assessment & Plan Note (Signed)
On Metformin  Prandin d/c New -  Farxiga F/u w/Dr [facility]

## 2021-10-19 NOTE — Progress Notes (Signed)
Subjective:  Patient ID: Brian Ayers, male    DOB: 1951/03/10  Age: 70 y.o. MRN: [id number]  CC: Follow-up   HPI Brian Ayers presents for DM, HTN, dyslipidemia. On Farxiga x 6 wks  Outpatient Medications Prior to Visit  Medication Sig Dispense Refill   ADVAIR DISKUS 100-50 MCG/DOSE AEPB INHALE 1 PUFF INTO THE LUNGS TWO TIMES DAILY 180 each 3   aspirin 81 MG chewable tablet Chew 81 mg by mouth daily.     azelastine (ASTELIN) 0.1 % nasal spray USE 1 SPRAY INTO EACH NOSTRIL TWICE A DAY     Biotin 2500 MCG CAPS Take 2,500 mcg by mouth daily.     Blood Glucose Monitoring Suppl (ONETOUCH VERIO FLEX SYSTEM) w/Device KIT USE AS DIRECTED TO CHECK BLOOD SUGARS 1 kit 0   clotrimazole-betamethasone (LOTRISONE) cream Apply 1 application topically 2 (two) times daily. 30 g 0   Continuous Blood Gluc Sensor (FREESTYLE LIBRE 2 SENSOR) MISC 1 Device by Does not apply route as directed. 2 each 11   dapagliflozin propanediol (FARXIGA) 5 MG TABS tablet Take 1 tablet (5 mg total) by mouth daily before breakfast. 30 tablet 6   EPINEPHrine 0.3 mg/0.3 mL IJ SOAJ injection as needed.  0   famotidine (PEPCID) 20 MG tablet TAKE 1 TABLET BY MOUTH TWICE A DAY 180 tablet 2   fexofenadine (ALLEGRA) 180 MG tablet Take 180 mg by mouth daily.     fluticasone (FLONASE) 50 MCG/ACT nasal spray SPRAY 1 SPRAY INTO EACH NOSTRIL EVERY DAY     metFORMIN (GLUCOPHAGE-XR) 500 MG 24 hr tablet Take 2 tablets (1,000 mg total) by mouth 2 (two) times daily. 360 tablet 3   OneTouch Delica Lancets 86P MISC USE TO CHECK BS TWICE A DAY 100 each 5   ONETOUCH VERIO test strip USE AS INSTRUCTED 100 strip 5   OVER THE COUNTER MEDICATION Saw Palmetto 450 mg, twice daily.     OVER THE COUNTER MEDICATION Vitamin D 3 One capsule daily.     PROAIR HFA 108 (90 BASE) MCG/ACT inhaler as needed.  0   rosuvastatin (CRESTOR) 10 MG tablet Take 1 tablet (10 mg total) by mouth daily. 90 tablet 3   sildenafil (VIAGRA) 100 MG tablet Take 1 tablet  (100 mg total) by mouth as needed for erectile dysfunction. 12 tablet 11   UNABLE TO FIND Med Name: Takes allergy shots weekly     valsartan (DIOVAN) 160 MG tablet TAKE 1 TABLET BY MOUTH EVERY DAY 90 tablet 2   COVID-19 mRNA bivalent vaccine, Moderna, (MODERNA COVID-19 BIVAL BOOSTER) 50 MCG/0.5ML injection Inject into the muscle. 0.5 mL 0   COVID-19 mRNA vaccine, Moderna, 100 MCG/0.5ML injection Inject into the muscle. 0.25 mL 0   influenza vaccine adjuvanted (FLUAD) 0.5 ML injection Inject into the muscle. 0.5 mL 0   repaglinide (PRANDIN) 1 MG tablet TAKE 1 TABLET BY MOUTH THREE TIMES A DAY BEFORE MEALS 270 tablet 1   No facility-administered medications prior to visit.    ROS: Review of Systems  Constitutional:  Negative for appetite change, fatigue and unexpected weight change.  HENT:  Negative for congestion, nosebleeds, sneezing, sore throat and trouble swallowing.   Eyes:  Negative for itching and visual disturbance.  Respiratory:  Negative for cough.   Cardiovascular:  Negative for chest pain, palpitations and leg swelling.  Gastrointestinal:  Negative for abdominal distention, blood in stool, diarrhea and nausea.  Genitourinary:  Negative for frequency and hematuria.  Musculoskeletal:  Negative for  back pain, gait problem, joint swelling and neck pain.  Skin:  Negative for rash.  Neurological:  Negative for dizziness, tremors, speech difficulty and weakness.  Psychiatric/Behavioral:  Negative for agitation, dysphoric mood and sleep disturbance. The patient is not nervous/anxious.    Objective:  BP 130/72 (BP Location: Right Arm, Patient Position: Sitting, Cuff Size: Normal)   Pulse 63   Temp 98.3 F (36.8 C) (Oral)   Ht $R'5\' 10"'gz$  (1.778 m)   Wt 197 lb (89.4 kg)   SpO2 98%   BMI 28.27 kg/m   BP Readings from Last 3 Encounters:  10/19/21 130/72  08/26/21 (!) 150/80  05/31/21 140/80    Wt Readings from Last 3 Encounters:  10/19/21 197 lb (89.4 kg)  08/26/21 202 lb 6.4 oz  (91.8 kg)  05/31/21 205 lb 3.2 oz (93.1 kg)    Physical Exam Constitutional:      General: He is not in acute distress.    Appearance: He is well-developed.     Comments: NAD  Eyes:     Conjunctiva/sclera: Conjunctivae normal.     Pupils: Pupils are equal, round, and reactive to light.  Neck:     Thyroid: No thyromegaly.     Vascular: No JVD.  Cardiovascular:     Rate and Rhythm: Normal rate and regular rhythm.     Heart sounds: Normal heart sounds. No murmur heard.   No friction rub. No gallop.  Pulmonary:     Effort: Pulmonary effort is normal. No respiratory distress.     Breath sounds: Normal breath sounds. No wheezing or rales.  Chest:     Chest wall: No tenderness.  Abdominal:     General: Bowel sounds are normal. There is no distension.     Palpations: Abdomen is soft. There is no mass.     Tenderness: There is no abdominal tenderness. There is no guarding or rebound.  Musculoskeletal:        General: No tenderness. Normal range of motion.     Cervical back: Normal range of motion.  Lymphadenopathy:     Cervical: No cervical adenopathy.  Skin:    General: Skin is warm and dry.     Findings: No rash.  Neurological:     Mental Status: He is alert and oriented to person, place, and time.     Cranial Nerves: No cranial nerve deficit.     Motor: No abnormal muscle tone.     Coordination: Coordination normal.     Gait: Gait normal.     Deep Tendon Reflexes: Reflexes are normal and symmetric.  Psychiatric:        Behavior: Behavior normal.        Thought Content: Thought content normal.        Judgment: Judgment normal.    Lab Results  Component Value Date   WBC 7.1 03/23/2021   HGB 14.0 03/23/2021   HCT 40.6 03/23/2021   PLT 203.0 03/23/2021   GLUCOSE 133 (H) 10/03/2021   CHOL 137 03/23/2021   TRIG 86.0 03/23/2021   HDL 52.10 03/23/2021   LDLCALC 68 03/23/2021   ALT 20 03/23/2021   AST 18 03/23/2021   NA 138 10/03/2021   K 4.1 10/03/2021   CL 105  10/03/2021   CREATININE 0.97 10/03/2021   BUN 24 (H) 10/03/2021   CO2 23 10/03/2021   TSH 3.55 03/23/2021   PSA 0.81 03/23/2021   HGBA1C 6.6 (H) 10/03/2021   MICROALBUR 0.8 03/23/2021    CT CARDIAC  SCORING  Addendum Date: 11/07/2018   ADDENDUM REPORT: 11/07/2018 17:47 CLINICAL DATA:  Risk stratification EXAM: Coronary Calcium Score TECHNIQUE: The patient was scanned on a [employer]. Axial non-contrast 3 mm slices were carried out through the heart. The data set was analyzed on a dedicated work station and scored using the [place]. FINDINGS: Non-cardiac: See separate report from [facility]. Ascending Aorta: Normal size, mild diffuse calcifications in the aortic arch and descending aorta. Pericardium: Normal. Coronary arteries: Normal origin. IMPRESSION: Coronary calcium score of 76. This was 13 percentile for age and sex matched control. Electronically Signed   By: Ena Dawley   On: 11/07/2018 17:47   Result Date: 11/07/2018 EXAM: OVER-READ INTERPRETATION  CT CHEST The following report is an over-read performed by radiologist Dr. Rolm Baptise of [facility] on 11/07/2018. This over-read does not include interpretation of cardiac or coronary anatomy or pathology. The coronary calcium score interpretation by the cardiologist is attached. COMPARISON:  None FINDINGS: Vascular: Heart is normal size.  Aorta is normal caliber. Mediastinum/Nodes: No adenopathy in the visualized lower mediastinum or hila. Lungs/Pleura: Visualized lungs clear.  No effusions. Upper Abdomen: Imaging into the upper abdomen shows no acute findings. Musculoskeletal: Chest wall soft tissues are unremarkable. No acute bony abnormality. IMPRESSION: No acute or significant extracardiac abnormality. Electronically Signed: By: Rolm Baptise M.D. On: 11/07/2018 16:51    Assessment & Plan:   Problem List Items Addressed This Visit     Diabetes type 2, controlled ([place])    On Metformin   Prandin d/c New -  Farxiga F/u w/Dr Shamleffer      Dyslipidemia    On Crestor      GERD (gastroesophageal reflux disease)    On Pepcid      Other Visit Diagnoses     Need for immunization against influenza    -  Primary   Relevant Orders   Flu Vaccine QUAD High Dose(Fluad) (Completed)         No orders of the defined types were placed in this encounter.     Follow-up: Return in about 4 months (around 02/16/2022) for a follow-up visit.  Walker Kehr, MD

## 2021-10-19 NOTE — Assessment & Plan Note (Signed)
On Pepcid 

## 2021-10-19 NOTE — Assessment & Plan Note (Signed)
On Crestor 

## 2021-10-24 DIAGNOSIS — J3089 Other allergic rhinitis: Secondary | ICD-10-CM | POA: Diagnosis not present

## 2021-10-24 DIAGNOSIS — J3081 Allergic rhinitis due to animal (cat) (dog) hair and dander: Secondary | ICD-10-CM | POA: Diagnosis not present

## 2021-10-24 DIAGNOSIS — J301 Allergic rhinitis due to pollen: Secondary | ICD-10-CM | POA: Diagnosis not present

## 2021-10-25 ENCOUNTER — Other Ambulatory Visit: Payer: Self-pay | Admitting: Internal Medicine

## 2021-10-31 DIAGNOSIS — J3081 Allergic rhinitis due to animal (cat) (dog) hair and dander: Secondary | ICD-10-CM | POA: Diagnosis not present

## 2021-10-31 DIAGNOSIS — J3089 Other allergic rhinitis: Secondary | ICD-10-CM | POA: Diagnosis not present

## 2021-10-31 DIAGNOSIS — J301 Allergic rhinitis due to pollen: Secondary | ICD-10-CM | POA: Diagnosis not present

## 2021-11-07 DIAGNOSIS — J3089 Other allergic rhinitis: Secondary | ICD-10-CM | POA: Diagnosis not present

## 2021-11-07 DIAGNOSIS — J3081 Allergic rhinitis due to animal (cat) (dog) hair and dander: Secondary | ICD-10-CM | POA: Diagnosis not present

## 2021-11-07 DIAGNOSIS — J301 Allergic rhinitis due to pollen: Secondary | ICD-10-CM | POA: Diagnosis not present

## 2021-11-09 DIAGNOSIS — J3081 Allergic rhinitis due to animal (cat) (dog) hair and dander: Secondary | ICD-10-CM | POA: Diagnosis not present

## 2021-11-09 DIAGNOSIS — J301 Allergic rhinitis due to pollen: Secondary | ICD-10-CM | POA: Diagnosis not present

## 2021-11-09 DIAGNOSIS — J3089 Other allergic rhinitis: Secondary | ICD-10-CM | POA: Diagnosis not present

## 2021-11-14 DIAGNOSIS — J3089 Other allergic rhinitis: Secondary | ICD-10-CM | POA: Diagnosis not present

## 2021-11-14 DIAGNOSIS — J3081 Allergic rhinitis due to animal (cat) (dog) hair and dander: Secondary | ICD-10-CM | POA: Diagnosis not present

## 2021-11-14 DIAGNOSIS — H2511 Age-related nuclear cataract, right eye: Secondary | ICD-10-CM | POA: Diagnosis not present

## 2021-11-14 DIAGNOSIS — J301 Allergic rhinitis due to pollen: Secondary | ICD-10-CM | POA: Diagnosis not present

## 2021-11-16 ENCOUNTER — Encounter: Payer: Self-pay | Admitting: Internal Medicine

## 2021-11-16 ENCOUNTER — Other Ambulatory Visit: Payer: Self-pay | Admitting: Internal Medicine

## 2021-11-16 DIAGNOSIS — J3089 Other allergic rhinitis: Secondary | ICD-10-CM | POA: Diagnosis not present

## 2021-11-16 DIAGNOSIS — J301 Allergic rhinitis due to pollen: Secondary | ICD-10-CM | POA: Diagnosis not present

## 2021-11-16 DIAGNOSIS — J3081 Allergic rhinitis due to animal (cat) (dog) hair and dander: Secondary | ICD-10-CM | POA: Diagnosis not present

## 2021-11-20 ENCOUNTER — Other Ambulatory Visit: Payer: Self-pay | Admitting: Internal Medicine

## 2021-11-20 MED ORDER — SILDENAFIL CITRATE 20 MG PO TABS: ORAL_TABLET | ORAL | 3 refills | Status: DC

## 2021-11-21 DIAGNOSIS — J301 Allergic rhinitis due to pollen: Secondary | ICD-10-CM | POA: Diagnosis not present

## 2021-11-21 DIAGNOSIS — J3081 Allergic rhinitis due to animal (cat) (dog) hair and dander: Secondary | ICD-10-CM | POA: Diagnosis not present

## 2021-11-21 DIAGNOSIS — H26492 Other secondary cataract, left eye: Secondary | ICD-10-CM | POA: Diagnosis not present

## 2021-11-21 DIAGNOSIS — J3089 Other allergic rhinitis: Secondary | ICD-10-CM | POA: Diagnosis not present

## 2021-11-25 ENCOUNTER — Telehealth: Payer: Self-pay | Admitting: *Deleted

## 2021-11-25 NOTE — Telephone Encounter (Signed)
Rec'd PA for Sildenafil Citrate 20 mg submitted via cover-my-meds w/ (Key: [id number]). Rec'd msg stating PA sent to [facility] Part D for determination.Marland KitchenJohny Chess

## 2021-11-28 DIAGNOSIS — J3089 Other allergic rhinitis: Secondary | ICD-10-CM | POA: Diagnosis not present

## 2021-11-28 DIAGNOSIS — J3081 Allergic rhinitis due to animal (cat) (dog) hair and dander: Secondary | ICD-10-CM | POA: Diagnosis not present

## 2021-11-28 DIAGNOSIS — J301 Allergic rhinitis due to pollen: Secondary | ICD-10-CM | POA: Diagnosis not present

## 2021-11-30 NOTE — Telephone Encounter (Signed)
Check status on PA med received an Unfavorable outcome. I t states We denied this request under Medicare Part D because: The information provided by your prescriber did not meet the requirements for covering this medication.. Letter has been sent to pt,,,/lmb

## 2021-12-02 ENCOUNTER — Ambulatory Visit: Payer: Medicare Other | Admitting: Podiatry

## 2021-12-05 ENCOUNTER — Encounter: Payer: Self-pay | Admitting: Podiatry

## 2021-12-05 ENCOUNTER — Other Ambulatory Visit: Payer: Self-pay

## 2021-12-05 ENCOUNTER — Ambulatory Visit (INDEPENDENT_AMBULATORY_CARE_PROVIDER_SITE_OTHER): Payer: Medicare Other | Admitting: Podiatry

## 2021-12-05 DIAGNOSIS — J3081 Allergic rhinitis due to animal (cat) (dog) hair and dander: Secondary | ICD-10-CM | POA: Diagnosis not present

## 2021-12-05 DIAGNOSIS — E119 Type 2 diabetes mellitus without complications: Secondary | ICD-10-CM | POA: Diagnosis not present

## 2021-12-05 DIAGNOSIS — J3089 Other allergic rhinitis: Secondary | ICD-10-CM | POA: Diagnosis not present

## 2021-12-05 DIAGNOSIS — J301 Allergic rhinitis due to pollen: Secondary | ICD-10-CM | POA: Diagnosis not present

## 2021-12-05 NOTE — Progress Notes (Signed)
Subjective:   Patient ID: Brian Ayers, male   DOB: 70 y.o.   MRN: [id number]   HPI Patient presents with long-term diabetes just wanted to have a check status doing very good job of taking care of himself now with his A1c running around 6.2 and here for diabetic exam with keratotic lesions underneath both his feet.  Not having any tingling burning     ROS      Objective:  Physical Exam  Neurovascular status was found to be intact with good DTR reflexes noted and sharp dull vibratory.  Patient has keratotic lesion distal aspect metatarsals bilateral that are localized with probable friction no breakdown of skin no erythema edema noted     Assessment:  Patient is doing very well and stable currently with his diabetes who is now in complete control     Plan:  H&P diabetic foot exam rendered and diabetic education explained.  I do not think he needs to do yearly visits if he is staying at such an excellent range as I do not see any pathology and the odds of anything occurring are very slim even though he is encouraged still to do daily exams.  Patient will be seen back on an as-needed basis

## 2021-12-13 DIAGNOSIS — J3089 Other allergic rhinitis: Secondary | ICD-10-CM | POA: Diagnosis not present

## 2021-12-13 DIAGNOSIS — J3081 Allergic rhinitis due to animal (cat) (dog) hair and dander: Secondary | ICD-10-CM | POA: Diagnosis not present

## 2021-12-13 DIAGNOSIS — J301 Allergic rhinitis due to pollen: Secondary | ICD-10-CM | POA: Diagnosis not present

## 2021-12-18 ENCOUNTER — Other Ambulatory Visit: Payer: Self-pay | Admitting: Internal Medicine

## 2021-12-20 DIAGNOSIS — J3081 Allergic rhinitis due to animal (cat) (dog) hair and dander: Secondary | ICD-10-CM | POA: Diagnosis not present

## 2021-12-20 DIAGNOSIS — J3089 Other allergic rhinitis: Secondary | ICD-10-CM | POA: Diagnosis not present

## 2021-12-20 DIAGNOSIS — J301 Allergic rhinitis due to pollen: Secondary | ICD-10-CM | POA: Diagnosis not present

## 2021-12-25 ENCOUNTER — Other Ambulatory Visit: Payer: Self-pay | Admitting: Internal Medicine

## 2021-12-27 DIAGNOSIS — J3081 Allergic rhinitis due to animal (cat) (dog) hair and dander: Secondary | ICD-10-CM | POA: Diagnosis not present

## 2021-12-27 DIAGNOSIS — J301 Allergic rhinitis due to pollen: Secondary | ICD-10-CM | POA: Diagnosis not present

## 2021-12-27 DIAGNOSIS — J3089 Other allergic rhinitis: Secondary | ICD-10-CM | POA: Diagnosis not present

## 2021-12-27 DIAGNOSIS — J4520 Mild intermittent asthma, uncomplicated: Secondary | ICD-10-CM | POA: Diagnosis not present

## 2021-12-27 NOTE — Progress Notes (Signed)
Name: Brian Ayers  Age/ Sex: 71 y.o., male   MRN/ DOB: [id number], [birth year]     PCP: Brian Anger, MD   Reason for Endocrinology Evaluation: Type 2 Diabetes Mellitus  Initial Endocrine Consultative Visit: 02/07/2021    PATIENT IDENTIFIER: Brian Ayers is a 71 y.o. male with a past medical history of  T2DM, Asthma and Dyslipidemia, with Hx of alcohol abuse. The patient has followed with Endocrinology clinic since 02/07/2021 for consultative assistance with management of his diabetes.  DIABETIC HISTORY:  [name] was diagnosed with DM in 2015. He has been on Metformin, repaglinide started 2/20222  His hemoglobin A1c has ranged from 6.7% in 2019, peaking at 7.6% in 2022.   On his intiial visit he had an A1c of 7.6 %. He was just started on Repaglinide so we continued this until 04/2021 due to recurrent hypoglycemia. We increased Metformin    Restarted Repaglinide in 06/2021 due to hyperglycemia with Bg's in 200's. But this was discontinued in 08/2021 and started on Farxiga  with an A1c 6.3 %   SUBJECTIVE:   During the last visit (08/26/2021): A1c 6.3% Continued metformin       Today (12/28/2021): [name] is here for a follow up on diabetes management.  He checks his blood sugars multiple  times daily, through CGM. The patient has not had hypoglycemic episodes .   Has occasional loose stools  1-2 x a week  Denies nausea or vomiting    HOME DIABETES REGIMEN:  Metformin 500 mg 2 tabs BID  Farxiga 5 mg daily     Statin: yes ACE-I/ARB: yes     CONTINUOUS GLUCOSE MONITORING RECORD INTERPRETATION    Dates of Recording: 12/29-1/10/2021  Sensor description:freestyle libre  Results statistics:   CGM use % of time 73  Average and SD 128/19.4  Time in range   94   %  % Time Above 180 3  % Time above 250 0  % Time Below target 3   Glycemic patterns summary: optimal glycose during the day and night   Hyperglycemic episodes  N/A  Hypoglycemic  episodes occurred  during the day but he has verified with finger sticks which are in the low 100's   Overnight periods: optimal        DIABETIC COMPLICATIONS: Microvascular complications:  Posterior vitreous detachment but no DR Denies: CKD, neuropathy  Last Eye Exam: Completed 06/2021  Macrovascular complications:   Denies: CAD, CVA, PVD   HISTORY:  Past Medical History:  Past Medical History:  Diagnosis Date   Alcohol abuse    hixtory of   Allergy    Asthma childhood   Cancer ([place])    skin cancer on face X1   Cataract    Chronic kidney disease    Diabetes mellitus without complication (HCC)    GERD (gastroesophageal reflux disease)    History of kidney stones    Hx of colonic polyps    Hyperlipidemia    Rhinitis    on allery shots   Past Surgical History:  Past Surgical History:  Procedure Laterality Date   CATARACT EXTRACTION Left    EXTRACORPOREAL SHOCK WAVE LITHOTRIPSY Right 03/14/2018   Procedure: RIGHT EXTRACORPOREAL SHOCK WAVE LITHOTRIPSY (ESWL);  Surgeon: Raynelle Bring, MD;  Location: [facility];  Service: Urology;  Laterality: Right;   hernia with hydrocele  1973   left shoulder pin setting  [place] EXTRACTION  2004  Social History:  reports that he has quit smoking. He quit smokeless tobacco use about 36 years ago.  His smokeless tobacco use included snuff and chew. He reports that he does not drink alcohol and does not use drugs. Family History:  Family History  Problem Relation Age of Onset   Cancer Mother        breast (hx of)   Other Mother        trigeminal neuralgia s/p gamma knife/ CHF   Hypertension Father    Diabetes Neg Hx    Colon cancer Neg Hx    Stomach cancer Neg Hx    Rectal cancer Neg Hx      HOME MEDICATIONS: Allergies as of 12/28/2021       Reactions   Pseudoephedrine    REACTION: tachycardia   Simvastatin    GERD        Medication List        Accurate as of December 28, 2021   1:42 PM. If you have any questions, ask your nurse or doctor.          Advair Diskus 100-50 MCG/ACT Aepb Generic drug: fluticasone-salmeterol INHALE 1 PUFF INTO THE LUNGS TWO TIMES DAILY   aspirin 81 MG chewable tablet Chew 81 mg by mouth daily.   azelastine 0.1 % nasal spray Commonly known as: ASTELIN USE 1 SPRAY INTO EACH NOSTRIL TWICE A DAY   Biotin 2500 MCG Caps Take 2,500 mcg by mouth daily.   clotrimazole-betamethasone cream Commonly known as: LOTRISONE Apply 1 application topically 2 (two) times daily.   dapagliflozin propanediol 5 MG Tabs tablet Commonly known as: Farxiga Take 1 tablet (5 mg total) by mouth daily before breakfast.   EPINEPHrine 0.3 mg/0.3 mL Soaj injection Commonly known as: EPI-PEN as needed.   famotidine 20 MG tablet Commonly known as: PEPCID TAKE 1 TABLET BY MOUTH TWICE A DAY   fexofenadine 180 MG tablet Commonly known as: ALLEGRA Take 180 mg by mouth daily.   fluticasone 50 MCG/ACT nasal spray Commonly known as: FLONASE SPRAY 1 SPRAY INTO EACH NOSTRIL EVERY DAY   FreeStyle Libre 2 Sensor Misc 1 DEVICE BY DOES NOT APPLY ROUTE AS DIRECTED.   irbesartan 150 MG tablet Commonly known as: AVAPRO Take 1 tablet (150 mg total) by mouth daily.   metFORMIN 500 MG 24 hr tablet Commonly known as: GLUCOPHAGE-XR Take 2 tablets (1,000 mg total) by mouth 2 (two) times daily.   OneTouch Delica Lancets 01X Misc USE TO CHECK BLOOD SUGER TWICE A DAY   OneTouch Verio Flex System w/Device Kit USE AS DIRECTED TO CHECK BLOOD SUGARS   OneTouch Verio test strip Generic drug: glucose blood USE AS INSTRUCTED   OVER THE COUNTER MEDICATION Saw Palmetto 450 mg, twice daily.   OVER THE COUNTER MEDICATION Vitamin D 3 One capsule daily.   ProAir HFA 108 (90 Base) MCG/ACT inhaler Generic drug: albuterol as needed.   rosuvastatin 10 MG tablet Commonly known as: CRESTOR Take 1 tablet (10 mg total) by mouth daily.   sildenafil 100 MG  tablet Commonly known as: VIAGRA TAKE 1 TABLET BY MOUTH EVERY DAY AS NEEDED FOR ERECTILE DYSFUNCTION   UNABLE TO FIND Med Name: Takes allergy shots weekly         OBJECTIVE:   Vital Signs: BP (!) 150/82 (BP Location: Left Arm, Patient Position: Sitting, Cuff Size: Small)    Pulse 84    Ht _0  (1.778 m)    Wt 201 lb 6.4 oz (91.4  kg)    SpO2 99%    BMI 28.90 kg/m   Wt Readings from Last 3 Encounters:  12/28/21 201 lb 6.4 oz (91.4 kg)  10/19/21 197 lb (89.4 kg)  08/26/21 202 lb 6.4 oz (91.8 kg)     Exam: General: Pt appears well and is in NAD  Lungs: Clear with good BS bilat with no rales, rhonchi, or wheezes  Heart: RRR with normal S1 and S2 and no gallops; no murmurs; no rub  Abdomen: Normoactive bowel sounds, soft, nontender, without masses or organomegaly palpable  Extremities: No pretibial edema.   Neuro: MS is good with appropriate affect, pt is alert and Ox3    DM foot exam:   04/22/2021  The skin of the feet is intact without sores or ulcerations. The pedal pulses are 2+ on right and 2+ on left. The sensation is intact to a screening 5.07, 10 gram monofilament bilaterally    DATA REVIEWED:  Lab Results  Component Value Date   HGBA1C 6.7 (A) 12/28/2021   HGBA1C 6.6 (H) 10/03/2021   HGBA1C 6.3 (A) 08/26/2021   Lab Results  Component Value Date   MICROALBUR 0.8 03/23/2021   LDLCALC 68 03/23/2021   CREATININE 0.97 10/03/2021   Lab Results  Component Value Date   MICRALBCREAT 6.4 03/23/2021     Lab Results  Component Value Date   CHOL 137 03/23/2021   HDL 52.10 03/23/2021   LDLCALC 68 03/23/2021   TRIG 86.0 03/23/2021   CHOLHDL 3 03/23/2021         Latest Reference Range & Units 10/03/21 07:53  Sodium 135 - 145 mEq/L 138  Potassium 3.5 - 5.1 mEq/L 4.1  Chloride 96 - 112 mEq/L 105  CO2 19 - 32 mEq/L 23  Glucose 70 - 99 mg/dL 133 (H)  BUN 6 - 23 mg/dL 24 (H)  Creatinine 0.40 - 1.50 mg/dL 0.97  Calcium 8.4 - 10.5 mg/dL 9.1    ASSESSMENT /  PLAN / RECOMMENDATIONS:   1) Type 2 Diabetes Mellitus, Optimally controlled, Without complications - Most recent A1c of 6.7 %. Goal A1c < 7.0 %.     - I have praised the pt on continued optimization of glucose control , he is tolerating [place] Gl[place], will increase  - CGM download shows hypoglycemia but he actually verified this with a finger stick and when the reading on CGM was 69 mg/dL , his finger stick was 123 mg/dL     MEDICATIONS: Continue  Metformin 500 mg, Two tablets with Breakfast and two tablets with Supper Increase  Farxiga 10 mg , 1 tablet daily    EDUCATION / INSTRUCTIONS: BG monitoring instructions: Patient is instructed to check his blood sugars 3 times a day, before meals . Call [facility] if: BG persistently < 70  I reviewed the Rule of 15 for the treatment of hypoglycemia in detail with the patient. Literature supplied.    2) Diabetic complications:  Eye: Does not have known diabetic retinopathy.  Neuro/ Feet: Does not have known diabetic peripheral neuropathy .  Renal: Patient does not have known baseline CKD. He   is  on an ACEI/ARB at present.      F/U in 4 months    Signed electronically by: Mack Guise, MD  [facility]  [facility] [address] [place], [place] [address] Phone: [phone] FAX: [phone]   CC: Brian Anger, MD [place] [address] Phone: [phone]  Fax:  [phone]  Return to Endocrinology clinic as below: Future Appointments  Date Time Provider [place]  02/22/2022 10:00 AM Plotnikov, Evie Lacks, MD [facility] None

## 2021-12-28 ENCOUNTER — Ambulatory Visit (INDEPENDENT_AMBULATORY_CARE_PROVIDER_SITE_OTHER): Payer: Medicare Other | Admitting: Internal Medicine

## 2021-12-28 ENCOUNTER — Encounter: Payer: Self-pay | Admitting: Internal Medicine

## 2021-12-28 ENCOUNTER — Other Ambulatory Visit: Payer: Self-pay

## 2021-12-28 VITALS — BP 150/82 | HR 84 | Ht 70.0 in | Wt 201.4 lb

## 2021-12-28 DIAGNOSIS — E119 Type 2 diabetes mellitus without complications: Secondary | ICD-10-CM

## 2021-12-28 LAB — POCT GLYCOSYLATED HEMOGLOBIN (HGB A1C): Hemoglobin A1C: 6.7 % — AB (ref 4.0–5.6)

## 2021-12-28 MED ORDER — DAPAGLIFLOZIN PROPANEDIOL 10 MG PO TABS: 10.0000 mg | ORAL_TABLET | Freq: Every day | ORAL | 3 refills | Status: DC

## 2021-12-28 MED ORDER — METFORMIN HCL ER 500 MG PO TB24: 1000.0000 mg | ORAL_TABLET | Freq: Two times a day (BID) | ORAL | 3 refills | Status: DC

## 2021-12-28 NOTE — Patient Instructions (Addendum)
°-   Continue  Metformin 500 mg, Two tablets with Breakfast and two tablets with Supper - Increase Farxiga 10  mg, 1 tablet daily     HOW TO TREAT LOW BLOOD SUGARS (Blood sugar LESS THAN 70 MG/DL) Please follow the RULE OF 15 for the treatment of hypoglycemia treatment (when your (blood sugars are less than 70 mg/dL)   STEP 1: Take 15 grams of carbohydrates when your blood sugar is low, which includes:  3-4 GLUCOSE TABS  OR 3-4 OZ OF JUICE OR REGULAR SODA OR ONE TUBE OF GLUCOSE GEL    STEP 2: RECHECK blood sugar in 15 MINUTES STEP 3: If your blood sugar is still low at the 15 minute recheck --> then, go back to STEP 1 and treat AGAIN with another 15 grams of carbohydrates.

## 2022-01-02 DIAGNOSIS — J3089 Other allergic rhinitis: Secondary | ICD-10-CM | POA: Diagnosis not present

## 2022-01-02 DIAGNOSIS — J301 Allergic rhinitis due to pollen: Secondary | ICD-10-CM | POA: Diagnosis not present

## 2022-01-02 DIAGNOSIS — J3081 Allergic rhinitis due to animal (cat) (dog) hair and dander: Secondary | ICD-10-CM | POA: Diagnosis not present

## 2022-01-04 DIAGNOSIS — Z961 Presence of intraocular lens: Secondary | ICD-10-CM | POA: Diagnosis not present

## 2022-01-09 DIAGNOSIS — J3081 Allergic rhinitis due to animal (cat) (dog) hair and dander: Secondary | ICD-10-CM | POA: Diagnosis not present

## 2022-01-09 DIAGNOSIS — J3089 Other allergic rhinitis: Secondary | ICD-10-CM | POA: Diagnosis not present

## 2022-01-09 DIAGNOSIS — J301 Allergic rhinitis due to pollen: Secondary | ICD-10-CM | POA: Diagnosis not present

## 2022-01-11 ENCOUNTER — Other Ambulatory Visit: Payer: Self-pay | Admitting: Internal Medicine

## 2022-01-16 DIAGNOSIS — J301 Allergic rhinitis due to pollen: Secondary | ICD-10-CM | POA: Diagnosis not present

## 2022-01-16 DIAGNOSIS — J3081 Allergic rhinitis due to animal (cat) (dog) hair and dander: Secondary | ICD-10-CM | POA: Diagnosis not present

## 2022-01-16 DIAGNOSIS — J3089 Other allergic rhinitis: Secondary | ICD-10-CM | POA: Diagnosis not present

## 2022-01-23 DIAGNOSIS — J301 Allergic rhinitis due to pollen: Secondary | ICD-10-CM | POA: Diagnosis not present

## 2022-01-23 DIAGNOSIS — J3089 Other allergic rhinitis: Secondary | ICD-10-CM | POA: Diagnosis not present

## 2022-01-23 DIAGNOSIS — J3081 Allergic rhinitis due to animal (cat) (dog) hair and dander: Secondary | ICD-10-CM | POA: Diagnosis not present

## 2022-01-30 DIAGNOSIS — J3081 Allergic rhinitis due to animal (cat) (dog) hair and dander: Secondary | ICD-10-CM | POA: Diagnosis not present

## 2022-01-30 DIAGNOSIS — J301 Allergic rhinitis due to pollen: Secondary | ICD-10-CM | POA: Diagnosis not present

## 2022-01-30 DIAGNOSIS — J3089 Other allergic rhinitis: Secondary | ICD-10-CM | POA: Diagnosis not present

## 2022-02-06 DIAGNOSIS — J301 Allergic rhinitis due to pollen: Secondary | ICD-10-CM | POA: Diagnosis not present

## 2022-02-06 DIAGNOSIS — J3081 Allergic rhinitis due to animal (cat) (dog) hair and dander: Secondary | ICD-10-CM | POA: Diagnosis not present

## 2022-02-06 DIAGNOSIS — J3089 Other allergic rhinitis: Secondary | ICD-10-CM | POA: Diagnosis not present

## 2022-02-10 ENCOUNTER — Telehealth: Payer: Self-pay | Admitting: Internal Medicine

## 2022-02-10 NOTE — Telephone Encounter (Signed)
Pts spouse requesting order for labs prior to pts CPE on 03-29-2022  Advised spouse of medicare's guidelines and possible out of pocket cost for labs done prior to appt, pt states if there is an out of pocket expense, pt will have labs done the day of appt

## 2022-02-13 DIAGNOSIS — J301 Allergic rhinitis due to pollen: Secondary | ICD-10-CM | POA: Diagnosis not present

## 2022-02-13 DIAGNOSIS — J3081 Allergic rhinitis due to animal (cat) (dog) hair and dander: Secondary | ICD-10-CM | POA: Diagnosis not present

## 2022-02-13 DIAGNOSIS — J3089 Other allergic rhinitis: Secondary | ICD-10-CM | POA: Diagnosis not present

## 2022-02-14 DIAGNOSIS — D2272 Melanocytic nevi of left lower limb, including hip: Secondary | ICD-10-CM | POA: Diagnosis not present

## 2022-02-14 DIAGNOSIS — D1801 Hemangioma of skin and subcutaneous tissue: Secondary | ICD-10-CM | POA: Diagnosis not present

## 2022-02-14 DIAGNOSIS — L814 Other melanin hyperpigmentation: Secondary | ICD-10-CM | POA: Diagnosis not present

## 2022-02-14 DIAGNOSIS — D225 Melanocytic nevi of trunk: Secondary | ICD-10-CM | POA: Diagnosis not present

## 2022-02-14 DIAGNOSIS — Z85828 Personal history of other malignant neoplasm of skin: Secondary | ICD-10-CM | POA: Diagnosis not present

## 2022-02-14 DIAGNOSIS — L570 Actinic keratosis: Secondary | ICD-10-CM | POA: Diagnosis not present

## 2022-02-14 DIAGNOSIS — L821 Other seborrheic keratosis: Secondary | ICD-10-CM | POA: Diagnosis not present

## 2022-02-14 DIAGNOSIS — L578 Other skin changes due to chronic exposure to nonionizing radiation: Secondary | ICD-10-CM | POA: Diagnosis not present

## 2022-02-20 DIAGNOSIS — J3081 Allergic rhinitis due to animal (cat) (dog) hair and dander: Secondary | ICD-10-CM | POA: Diagnosis not present

## 2022-02-20 DIAGNOSIS — J301 Allergic rhinitis due to pollen: Secondary | ICD-10-CM | POA: Diagnosis not present

## 2022-02-20 DIAGNOSIS — J3089 Other allergic rhinitis: Secondary | ICD-10-CM | POA: Diagnosis not present

## 2022-02-22 ENCOUNTER — Ambulatory Visit: Payer: Medicare Other | Admitting: Internal Medicine

## 2022-02-27 DIAGNOSIS — J301 Allergic rhinitis due to pollen: Secondary | ICD-10-CM | POA: Diagnosis not present

## 2022-02-27 DIAGNOSIS — J3089 Other allergic rhinitis: Secondary | ICD-10-CM | POA: Diagnosis not present

## 2022-02-27 DIAGNOSIS — J3081 Allergic rhinitis due to animal (cat) (dog) hair and dander: Secondary | ICD-10-CM | POA: Diagnosis not present

## 2022-03-06 DIAGNOSIS — J3089 Other allergic rhinitis: Secondary | ICD-10-CM | POA: Diagnosis not present

## 2022-03-06 DIAGNOSIS — J301 Allergic rhinitis due to pollen: Secondary | ICD-10-CM | POA: Diagnosis not present

## 2022-03-06 DIAGNOSIS — J3081 Allergic rhinitis due to animal (cat) (dog) hair and dander: Secondary | ICD-10-CM | POA: Diagnosis not present

## 2022-03-13 DIAGNOSIS — J3081 Allergic rhinitis due to animal (cat) (dog) hair and dander: Secondary | ICD-10-CM | POA: Diagnosis not present

## 2022-03-13 DIAGNOSIS — J301 Allergic rhinitis due to pollen: Secondary | ICD-10-CM | POA: Diagnosis not present

## 2022-03-13 DIAGNOSIS — J3089 Other allergic rhinitis: Secondary | ICD-10-CM | POA: Diagnosis not present

## 2022-03-15 DIAGNOSIS — J3089 Other allergic rhinitis: Secondary | ICD-10-CM | POA: Diagnosis not present

## 2022-03-15 DIAGNOSIS — J3081 Allergic rhinitis due to animal (cat) (dog) hair and dander: Secondary | ICD-10-CM | POA: Diagnosis not present

## 2022-03-15 DIAGNOSIS — J301 Allergic rhinitis due to pollen: Secondary | ICD-10-CM | POA: Diagnosis not present

## 2022-03-18 ENCOUNTER — Other Ambulatory Visit: Payer: Self-pay | Admitting: Internal Medicine

## 2022-03-20 ENCOUNTER — Encounter: Payer: Self-pay | Admitting: Internal Medicine

## 2022-03-20 DIAGNOSIS — J3089 Other allergic rhinitis: Secondary | ICD-10-CM | POA: Diagnosis not present

## 2022-03-20 DIAGNOSIS — J3081 Allergic rhinitis due to animal (cat) (dog) hair and dander: Secondary | ICD-10-CM | POA: Diagnosis not present

## 2022-03-20 DIAGNOSIS — J301 Allergic rhinitis due to pollen: Secondary | ICD-10-CM | POA: Diagnosis not present

## 2022-03-22 ENCOUNTER — Telehealth: Payer: Self-pay | Admitting: Internal Medicine

## 2022-03-22 DIAGNOSIS — Z0000 Encounter for general adult medical examination without abnormal findings: Secondary | ICD-10-CM

## 2022-03-22 DIAGNOSIS — J4520 Mild intermittent asthma, uncomplicated: Secondary | ICD-10-CM

## 2022-03-22 DIAGNOSIS — E119 Type 2 diabetes mellitus without complications: Secondary | ICD-10-CM

## 2022-03-22 NOTE — Telephone Encounter (Signed)
Pts spouse checking response status of mychart mssg that reads  ? ?"Good Morning Dr. Alain Marion: I look forward to seeing you for my physical on the 12th. I had called to see if my order was ok to come in for my normal bloodwork (A1c, Psa, cholesterol etc) for my annual physical prior. When I called your office I was told that Medicare did not pay for bloodwork until after the physical. I thought we had always done it before, and didn?t know how a fasting A1c could be accomplished (since my appointment isn?t until 10:20) , unless I come in early the next morning after my appointment. Brian Ayers" ? ? ?Please advise ?

## 2022-03-25 NOTE — Telephone Encounter (Signed)
Okay.  Thanks.

## 2022-03-27 DIAGNOSIS — J301 Allergic rhinitis due to pollen: Secondary | ICD-10-CM | POA: Diagnosis not present

## 2022-03-27 DIAGNOSIS — J3081 Allergic rhinitis due to animal (cat) (dog) hair and dander: Secondary | ICD-10-CM | POA: Diagnosis not present

## 2022-03-27 DIAGNOSIS — J3089 Other allergic rhinitis: Secondary | ICD-10-CM | POA: Diagnosis not present

## 2022-03-28 ENCOUNTER — Encounter: Payer: Self-pay | Admitting: Internal Medicine

## 2022-03-28 ENCOUNTER — Other Ambulatory Visit (INDEPENDENT_AMBULATORY_CARE_PROVIDER_SITE_OTHER): Payer: Medicare Other

## 2022-03-28 DIAGNOSIS — Z125 Encounter for screening for malignant neoplasm of prostate: Secondary | ICD-10-CM

## 2022-03-28 DIAGNOSIS — J4520 Mild intermittent asthma, uncomplicated: Secondary | ICD-10-CM

## 2022-03-28 DIAGNOSIS — E119 Type 2 diabetes mellitus without complications: Secondary | ICD-10-CM | POA: Diagnosis not present

## 2022-03-28 DIAGNOSIS — Z0000 Encounter for general adult medical examination without abnormal findings: Secondary | ICD-10-CM

## 2022-03-28 LAB — COMPREHENSIVE METABOLIC PANEL
ALT: 21 U/L (ref 0–53)
AST: 18 U/L (ref 0–37)
Albumin: 4.5 g/dL (ref 3.5–5.2)
Alkaline Phosphatase: 59 U/L (ref 39–117)
BUN: 20 mg/dL (ref 6–23)
CO2: 27 mEq/L (ref 19–32)
Calcium: 9.4 mg/dL (ref 8.4–10.5)
Chloride: 102 mEq/L (ref 96–112)
Creatinine, Ser: 1.02 mg/dL (ref 0.40–1.50)
GFR: 75.35 mL/min (ref >60.00)
Glucose, Bld: 133 mg/dL — ABNORMAL HIGH (ref 70–99)
Potassium: 4.4 mEq/L (ref 3.5–5.1)
Sodium: 138 mEq/L (ref 135–145)
Total Bilirubin: 0.9 mg/dL (ref 0.2–1.2)
Total Protein: 6.3 g/dL (ref 6.0–8.3)

## 2022-03-28 LAB — CBC WITH DIFFERENTIAL/PLATELET
Basophils Absolute: 0.1 10*3/uL (ref 0.0–0.1)
Basophils Relative: 1 % (ref 0.0–3.0)
Eosinophils Absolute: 0.7 10*3/uL (ref 0.0–0.7)
Eosinophils Relative: 8.7 % — ABNORMAL HIGH (ref 0.0–5.0)
HCT: 42.4 % (ref 39.0–52.0)
Hemoglobin: 14.4 g/dL (ref 13.0–17.0)
Lymphocytes Relative: 35.4 % (ref 12.0–46.0)
Lymphs Abs: 2.7 10*3/uL (ref 0.7–4.0)
MCHC: 34 g/dL (ref 30.0–36.0)
MCV: 92.1 fl (ref 78.0–100.0)
Monocytes Absolute: 0.9 10*3/uL (ref 0.1–1.0)
Monocytes Relative: 11.7 % (ref 3.0–12.0)
Neutro Abs: 3.3 10*3/uL (ref 1.4–7.7)
Neutrophils Relative %: 43.2 % (ref 43.0–77.0)
Platelets: 222 10*3/uL (ref 150.0–400.0)
RBC: 4.61 Mil/uL (ref 4.22–5.81)
RDW: 12.3 % (ref 11.5–15.5)
WBC: 7.5 10*3/uL (ref 4.0–10.5)

## 2022-03-28 LAB — URINALYSIS
Bilirubin Urine: NEGATIVE
Hgb urine dipstick: NEGATIVE
Ketones, ur: NEGATIVE
Leukocytes,Ua: NEGATIVE
Nitrite: NEGATIVE
Specific Gravity, Urine: <=1.005 — AB (ref 1.000–1.030)
Total Protein, Urine: NEGATIVE
Urine Glucose: >=1000 — AB
Urobilinogen, UA: 0.2 (ref 0.0–1.0)
pH: 6 (ref 5.0–8.0)

## 2022-03-28 LAB — TSH: TSH: 3.44 u[IU]/mL (ref 0.35–5.50)

## 2022-03-28 LAB — LIPID PANEL
Cholesterol: 151 mg/dL (ref 0–200)
HDL: 56.3 mg/dL (ref >39.00)
LDL Cholesterol: 74 mg/dL (ref 0–99)
NonHDL: 94.98
Total CHOL/HDL Ratio: 3
Triglycerides: 104 mg/dL (ref 0.0–149.0)
VLDL: 20.8 mg/dL (ref 0.0–40.0)

## 2022-03-28 LAB — PSA: PSA: 0.7 ng/mL (ref 0.10–4.00)

## 2022-03-28 LAB — MICROALBUMIN / CREATININE URINE RATIO
Creatinine,U: 26.6 mg/dL
Microalb Creat Ratio: 9 mg/g (ref 0.0–30.0)
Microalb, Ur: 2.4 mg/dL — ABNORMAL HIGH (ref 0.0–1.9)

## 2022-03-28 LAB — HEMOGLOBIN A1C: Hgb A1c MFr Bld: 7 % — ABNORMAL HIGH (ref 4.6–6.5)

## 2022-03-29 ENCOUNTER — Ambulatory Visit (INDEPENDENT_AMBULATORY_CARE_PROVIDER_SITE_OTHER): Payer: Medicare Other | Admitting: Internal Medicine

## 2022-03-29 ENCOUNTER — Other Ambulatory Visit: Payer: Self-pay | Admitting: Internal Medicine

## 2022-03-29 ENCOUNTER — Encounter: Payer: Self-pay | Admitting: Internal Medicine

## 2022-03-29 DIAGNOSIS — J301 Allergic rhinitis due to pollen: Secondary | ICD-10-CM | POA: Insufficient documentation

## 2022-03-29 DIAGNOSIS — J4520 Mild intermittent asthma, uncomplicated: Secondary | ICD-10-CM | POA: Diagnosis not present

## 2022-03-29 DIAGNOSIS — Z0000 Encounter for general adult medical examination without abnormal findings: Secondary | ICD-10-CM

## 2022-03-29 DIAGNOSIS — J3081 Allergic rhinitis due to animal (cat) (dog) hair and dander: Secondary | ICD-10-CM | POA: Insufficient documentation

## 2022-03-29 DIAGNOSIS — E119 Type 2 diabetes mellitus without complications: Secondary | ICD-10-CM

## 2022-03-29 DIAGNOSIS — J3089 Other allergic rhinitis: Secondary | ICD-10-CM | POA: Diagnosis not present

## 2022-03-29 MED ORDER — FLUTICASONE-SALMETEROL 250-50 MCG/ACT IN AEPB: 1.0000 | INHALATION_SPRAY | Freq: Two times a day (BID) | RESPIRATORY_TRACT | 3 refills | Status: DC

## 2022-03-29 NOTE — Assessment & Plan Note (Signed)
Sub-optimal control ?Change Advair to 250-100 bid ?

## 2022-03-29 NOTE — Assessment & Plan Note (Signed)
On Metformin, Farxiga 10 mg/d F/u w/Dr Shamleffer 

## 2022-03-29 NOTE — Progress Notes (Signed)
? ?Subjective:  ?Patient ID: Brian Ayers, male    DOB: 1951-08-31  Age: 71 y.o. MRN: [id number] ? ?CC: No chief complaint on file. ? ? ?HPI ?Brian Ayers presents for a well exam. F/u on DM, HTN, dyslipidemia. C/o chest tightness in am - Proventi helps; no CP ? ?Outpatient Medications Prior to Visit  ?Medication Sig Dispense Refill  ? aspirin 81 MG chewable tablet Chew 81 mg by mouth daily.    ? azelastine (ASTELIN) 0.1 % nasal spray USE 1 SPRAY INTO EACH NOSTRIL TWICE A DAY    ? Biotin 2500 MCG CAPS Take 2,500 mcg by mouth daily.    ? Blood Glucose Monitoring Suppl (ONETOUCH VERIO FLEX SYSTEM) w/Device KIT USE AS DIRECTED TO CHECK BLOOD SUGARS 1 kit 0  ? clotrimazole-betamethasone (LOTRISONE) cream Apply 1 application topically 2 (two) times daily. 30 g 0  ? Continuous Blood Gluc Sensor (FREESTYLE LIBRE 2 SENSOR) MISC 1 DEVICE BY DOES NOT APPLY ROUTE AS DIRECTED. 6 each 3  ? dapagliflozin propanediol (FARXIGA) 10 MG TABS tablet Take 1 tablet (10 mg total) by mouth daily before breakfast. 90 tablet 3  ? EPINEPHrine 0.3 mg/0.3 mL IJ SOAJ injection as needed.  0  ? famotidine (PEPCID) 20 MG tablet TAKE 1 TABLET BY MOUTH TWICE A DAY 180 tablet 3  ? fexofenadine (ALLEGRA) 180 MG tablet Take 180 mg by mouth daily.    ? fluticasone (FLONASE) 50 MCG/ACT nasal spray SPRAY 1 SPRAY INTO EACH NOSTRIL EVERY DAY    ? irbesartan (AVAPRO) 150 MG tablet Take 1 tablet (150 mg total) by mouth daily. 90 tablet 3  ? metFORMIN (GLUCOPHAGE-XR) 500 MG 24 hr tablet Take 2 tablets (1,000 mg total) by mouth 2 (two) times daily. 360 tablet 3  ? OneTouch Delica Lancets 40J MISC USE TO CHECK BLOOD SUGER TWICE A DAY 100 each 5  ? ONETOUCH VERIO test strip USE AS INSTRUCTED 100 strip 5  ? OVER THE COUNTER MEDICATION Saw Palmetto 450 mg, twice daily.    ? OVER THE COUNTER MEDICATION Vitamin D 3 One capsule daily.    ? PROAIR HFA 108 (90 BASE) MCG/ACT inhaler as needed.  0  ? rosuvastatin (CRESTOR) 10 MG tablet Take 1 tablet (10 mg total) by  mouth daily. 90 tablet 3  ? sildenafil (VIAGRA) 100 MG tablet TAKE 1 TABLET BY MOUTH EVERY DAY AS NEEDED FOR ERECTILE DYSFUNCTION 12 tablet 11  ? UNABLE TO FIND Med Name: Takes allergy shots weekly    ? ADVAIR DISKUS 100-50 MCG/DOSE AEPB INHALE 1 PUFF INTO THE LUNGS TWO TIMES DAILY 180 each 3  ? ?No facility-administered medications prior to visit.  ? ? ?ROS: ?Review of Systems  ?Constitutional:  Negative for appetite change, fatigue and unexpected weight change.  ?HENT:  Negative for congestion, nosebleeds, sneezing, sore throat and trouble swallowing.   ?Eyes:  Negative for itching and visual disturbance.  ?Respiratory:  Negative for cough.   ?Cardiovascular:  Negative for chest pain, palpitations and leg swelling.  ?Gastrointestinal:  Negative for abdominal distention, blood in stool, diarrhea and nausea.  ?Genitourinary:  Negative for frequency and hematuria.  ?Musculoskeletal:  Negative for back pain, gait problem, joint swelling and neck pain.  ?Skin:  Negative for rash.  ?Neurological:  Negative for dizziness, tremors, speech difficulty and weakness.  ?Psychiatric/Behavioral:  Negative for agitation, dysphoric mood and sleep disturbance. The patient is not nervous/anxious.   ? ?Objective:  ?BP 130/82 (BP Location: Left Arm, Patient Position: Sitting, Cuff Size:  Large)   Pulse 67   Temp 97.8 ?F (36.6 ?C) (Oral)   Ht $R'5\' 10"'Ng$  (1.778 m)   Wt 201 lb (91.2 kg)   SpO2 98%   BMI 28.84 kg/m?  ? ?BP Readings from Last 3 Encounters:  ?03/29/22 130/82  ?12/28/21 (!) 150/82  ?10/19/21 130/72  ? ? ?Wt Readings from Last 3 Encounters:  ?03/29/22 201 lb (91.2 kg)  ?12/28/21 201 lb 6.4 oz (91.4 kg)  ?10/19/21 197 lb (89.4 kg)  ? ? ?Physical Exam ?Constitutional:   ?   General: He is not in acute distress. ?   Appearance: He is well-developed.  ?   Comments: NAD  ?Eyes:  ?   Conjunctiva/sclera: Conjunctivae normal.  ?   Pupils: Pupils are equal, round, and reactive to light.  ?Neck:  ?   Thyroid: No thyromegaly.  ?    Vascular: No JVD.  ?Cardiovascular:  ?   Rate and Rhythm: Normal rate and regular rhythm.  ?   Heart sounds: Normal heart sounds. No murmur heard. ?  No friction rub. No gallop.  ?Pulmonary:  ?   Effort: Pulmonary effort is normal. No respiratory distress.  ?   Breath sounds: Normal breath sounds. No wheezing or rales.  ?Chest:  ?   Chest wall: No tenderness.  ?Abdominal:  ?   General: Bowel sounds are normal. There is no distension.  ?   Palpations: Abdomen is soft. There is no mass.  ?   Tenderness: There is no abdominal tenderness. There is no guarding or rebound.  ?Musculoskeletal:     ?   General: No tenderness. Normal range of motion.  ?   Cervical back: Normal range of motion.  ?Lymphadenopathy:  ?   Cervical: No cervical adenopathy.  ?Skin: ?   General: Skin is warm and dry.  ?   Findings: No rash.  ?Neurological:  ?   Mental Status: He is alert and oriented to person, place, and time.  ?   Cranial Nerves: No cranial nerve deficit.  ?   Motor: No abnormal muscle tone.  ?   Coordination: Coordination normal.  ?   Gait: Gait normal.  ?   Deep Tendon Reflexes: Reflexes are normal and symmetric.  ?Psychiatric:     ?   Behavior: Behavior normal.     ?   Thought Content: Thought content normal.     ?   Judgment: Judgment normal.  ?Rectal - per Dr Henrene Pastor ?No carotid bruit B ? ? ?Lab Results  ?Component Value Date  ? WBC 7.5 03/28/2022  ? HGB 14.4 03/28/2022  ? HCT 42.4 03/28/2022  ? PLT 222.0 03/28/2022  ? GLUCOSE 133 (H) 03/28/2022  ? CHOL 151 03/28/2022  ? TRIG 104.0 03/28/2022  ? HDL 56.30 03/28/2022  ? Dawson 74 03/28/2022  ? ALT 21 03/28/2022  ? AST 18 03/28/2022  ? NA 138 03/28/2022  ? K 4.4 03/28/2022  ? CL 102 03/28/2022  ? CREATININE 1.02 03/28/2022  ? BUN 20 03/28/2022  ? CO2 27 03/28/2022  ? TSH 3.44 03/28/2022  ? PSA 0.70 03/28/2022  ? HGBA1C 7.0 (H) 03/28/2022  ? MICROALBUR 2.4 (H) 03/28/2022  ? ? ?CT CARDIAC SCORING ? ?Addendum Date: 11/07/2018   ?ADDENDUM REPORT: 11/07/2018 17:47 CLINICAL DATA:  Risk  stratification EXAM: Coronary Calcium Score TECHNIQUE: The patient was scanned on a [employer]. Axial non-contrast 3 mm slices were carried out through the heart. The data set was analyzed on a dedicated work station  and scored using the Agatson method. FINDINGS: Non-cardiac: See separate report from [facility]. Ascending Aorta: Normal size, mild diffuse calcifications in the aortic arch and descending aorta. Pericardium: Normal. Coronary arteries: Normal origin. IMPRESSION: Coronary calcium score of 76. This was 23 percentile for age and sex matched control. Electronically Signed   By: Ena Dawley   On: 11/07/2018 17:47  ? ?Result Date: 11/07/2018 ?EXAM: OVER-READ INTERPRETATION  CT CHEST The following report is an over-read performed by radiologist Dr. Rolm Baptise of [facility] on 11/07/2018. This over-read does not include interpretation of cardiac or coronary anatomy or pathology. The coronary calcium score interpretation by the cardiologist is attached. COMPARISON:  None FINDINGS: Vascular: Heart is normal size.  Aorta is normal caliber. Mediastinum/Nodes: No adenopathy in the visualized lower mediastinum or hila. Lungs/Pleura: Visualized lungs clear.  No effusions. Upper Abdomen: Imaging into the upper abdomen shows no acute findings. Musculoskeletal: Chest wall soft tissues are unremarkable. No acute bony abnormality. IMPRESSION: No acute or significant extracardiac abnormality. Electronically Signed: By: Rolm Baptise M.D. On: 11/07/2018 16:51  ? ? ?Assessment & Plan:  ? ?Problem List Items Addressed This Visit   ? ? Well adult exam  ?   ?We discussed age appropriate health related issues, including available/recomended screening tests and vaccinations. Labs were ordered to be later reviewed . All questions were answered. We discussed one or more of the following - seat belt use, use of sunscreen/sun exposure exercise, fall risk reduction, second hand smoke exposure,  firearm use and storage, seat belt use, a need for adhering to healthy diet and exercise. ?Labs were ordered.  All questions were answered. ?Colon due in 2023 - Dr Henrene Pastor (last in 2020  ? ? ?  ?  ? Diabetes type

## 2022-03-29 NOTE — Assessment & Plan Note (Addendum)
?  We discussed age appropriate health related issues, including available/recomended screening tests and vaccinations. Labs were ordered to be later reviewed . All questions were answered. We discussed one or more of the following - seat belt use, use of sunscreen/sun exposure exercise, fall risk reduction, second hand smoke exposure, firearm use and storage, seat belt use, a need for adhering to healthy diet and exercise. ?Labs were ordered.  All questions were answered. ?Colon due in 2023 - Dr Henrene Pastor (last in 2020  ? ? ?

## 2022-04-03 ENCOUNTER — Encounter: Payer: Self-pay | Admitting: Internal Medicine

## 2022-04-03 DIAGNOSIS — J3081 Allergic rhinitis due to animal (cat) (dog) hair and dander: Secondary | ICD-10-CM | POA: Diagnosis not present

## 2022-04-03 DIAGNOSIS — J3089 Other allergic rhinitis: Secondary | ICD-10-CM | POA: Diagnosis not present

## 2022-04-03 DIAGNOSIS — J301 Allergic rhinitis due to pollen: Secondary | ICD-10-CM | POA: Diagnosis not present

## 2022-04-05 DIAGNOSIS — J4520 Mild intermittent asthma, uncomplicated: Secondary | ICD-10-CM | POA: Diagnosis not present

## 2022-04-05 DIAGNOSIS — J3081 Allergic rhinitis due to animal (cat) (dog) hair and dander: Secondary | ICD-10-CM | POA: Diagnosis not present

## 2022-04-05 DIAGNOSIS — J301 Allergic rhinitis due to pollen: Secondary | ICD-10-CM | POA: Diagnosis not present

## 2022-04-05 DIAGNOSIS — J3089 Other allergic rhinitis: Secondary | ICD-10-CM | POA: Diagnosis not present

## 2022-04-10 DIAGNOSIS — J3081 Allergic rhinitis due to animal (cat) (dog) hair and dander: Secondary | ICD-10-CM | POA: Diagnosis not present

## 2022-04-10 DIAGNOSIS — J3089 Other allergic rhinitis: Secondary | ICD-10-CM | POA: Diagnosis not present

## 2022-04-10 DIAGNOSIS — J301 Allergic rhinitis due to pollen: Secondary | ICD-10-CM | POA: Diagnosis not present

## 2022-04-13 DIAGNOSIS — J3089 Other allergic rhinitis: Secondary | ICD-10-CM | POA: Diagnosis not present

## 2022-04-13 DIAGNOSIS — J301 Allergic rhinitis due to pollen: Secondary | ICD-10-CM | POA: Diagnosis not present

## 2022-04-13 DIAGNOSIS — J4520 Mild intermittent asthma, uncomplicated: Secondary | ICD-10-CM | POA: Diagnosis not present

## 2022-04-13 DIAGNOSIS — R21 Rash and other nonspecific skin eruption: Secondary | ICD-10-CM | POA: Diagnosis not present

## 2022-04-13 DIAGNOSIS — J3081 Allergic rhinitis due to animal (cat) (dog) hair and dander: Secondary | ICD-10-CM | POA: Diagnosis not present

## 2022-04-14 ENCOUNTER — Emergency Department (HOSPITAL_COMMUNITY)
Admission: EM | Admit: 2022-04-14 | Discharge: 2022-04-14 | Disposition: A | Discharge status: Home / Self Care | Payer: Medicare Other | Attending: Emergency Medicine | Admitting: Emergency Medicine

## 2022-04-14 ENCOUNTER — Other Ambulatory Visit: Payer: Self-pay | Admitting: Internal Medicine

## 2022-04-14 ENCOUNTER — Encounter (HOSPITAL_COMMUNITY): Payer: Self-pay

## 2022-04-14 ENCOUNTER — Other Ambulatory Visit: Payer: Self-pay

## 2022-04-14 DIAGNOSIS — R000 Tachycardia, unspecified: Secondary | ICD-10-CM

## 2022-04-14 DIAGNOSIS — T782XXA Anaphylactic shock, unspecified, initial encounter: Secondary | ICD-10-CM | POA: Insufficient documentation

## 2022-04-14 DIAGNOSIS — Z7982 Long term (current) use of aspirin: Secondary | ICD-10-CM | POA: Diagnosis not present

## 2022-04-14 DIAGNOSIS — T7840XA Allergy, unspecified, initial encounter: Secondary | ICD-10-CM | POA: Diagnosis present

## 2022-04-14 DIAGNOSIS — Z79899 Other long term (current) drug therapy: Secondary | ICD-10-CM | POA: Diagnosis not present

## 2022-04-14 MED ORDER — HYDROXYZINE HCL 10 MG PO TABS
10.0000 mg | ORAL_TABLET | Freq: Once | ORAL | Status: AC
  Administered 2022-04-14: 10 mg via ORAL
  Filled 2022-04-14: qty 1

## 2022-04-14 MED ORDER — FAMOTIDINE IN NACL 20-0.9 MG/50ML-% IV SOLN
20.0000 mg | Freq: Once | INTRAVENOUS | Status: AC
  Administered 2022-04-14: 20 mg via INTRAVENOUS
  Filled 2022-04-14: qty 50

## 2022-04-14 MED ORDER — METOPROLOL TARTRATE 5 MG/5ML IV SOLN
5.0000 mg | Freq: Once | INTRAVENOUS | Status: DC
  Filled 2022-04-14: qty 5

## 2022-04-14 MED ORDER — EPINEPHRINE 0.3 MG/0.3ML IJ SOAJ
0.3000 mg | Freq: Once | INTRAMUSCULAR | Status: AC
  Administered 2022-04-14: 0.3 mg via INTRAMUSCULAR
  Filled 2022-04-14: qty 0.3

## 2022-04-14 MED ORDER — HYDROXYZINE HCL 25 MG PO TABS: 25.0000 mg | ORAL_TABLET | Freq: Four times a day (QID) | ORAL | 0 refills | Status: DC

## 2022-04-14 MED ORDER — SODIUM CHLORIDE 0.9 % IV BOLUS
1000.0000 mL | Freq: Once | INTRAVENOUS | Status: AC
  Administered 2022-04-14: 1000 mL via INTRAVENOUS

## 2022-04-14 MED ORDER — DEXAMETHASONE SODIUM PHOSPHATE 10 MG/ML IJ SOLN
10.0000 mg | Freq: Once | INTRAMUSCULAR | Status: AC
Start: 2022-04-14 — End: 2022-04-14
  Administered 2022-04-14: 10 mg via INTRAVENOUS
  Filled 2022-04-14: qty 1

## 2022-04-14 MED ORDER — DIPHENHYDRAMINE HCL 50 MG/ML IJ SOLN
25.0000 mg | Freq: Once | INTRAMUSCULAR | Status: AC
Start: 2022-04-14 — End: 2022-04-14
  Administered 2022-04-14: 25 mg via INTRAVENOUS
  Filled 2022-04-14: qty 1

## 2022-04-14 MED ORDER — METOPROLOL TARTRATE 5 MG/5ML IV SOLN
5.0000 mg | Freq: Once | INTRAVENOUS | Status: AC
  Administered 2022-04-14: 5 mg via INTRAVENOUS
  Filled 2022-04-14: qty 5

## 2022-04-14 NOTE — ED Provider Notes (Addendum)
?[facility] DEPT ?Provider Note ? ? ?CSN: [id number] ?Arrival date & time: 04/14/22  5597 ? ?  ? ?History ? ?Chief Complaint  ?Patient presents with  ? Allergic Reaction  ? ? ?Brian Ayers is a 71 y.o. male. ? ?Patient is a 71 year old male who presents with allergic reaction.  He said he started having some swelling in the back of his throat around 4/18.  Prior to that, he had switched his inhaler from Advair to Symbicort.  He went to see his allergist and had a steroid injection of methylprednisolone 80 mg on 4/19.  He continued to have a feeling of swelling in the back of his throat.  He says it goes away with Symbicort and Flonase but then comes back again.  He started having a rash 2 days ago.  It is a diffuse pruritic rash.  He went back to see his allergist and got another shot of methylprednisolone yesterday.  This morning he woke up with swelling of his lips.  He still has a little bit of swelling in the back of his throat.  No trouble swallowing.  No shortness of breath currently.  He also has a diffuse rash that seems to be spreading.  It is pruritic.  He has been using this prescription oil from his allergist that does seem to be helping.  He had a similar reaction when he was a teenager to clams but has not had any significant allergy since that time.  He he has not used his EpiPen.  Earlier this month he did get some allergy shots but no other new exposures that he knows of.  Also of note, he did get possibly stung by something prior to all this happening.  He has a small bump on the back of his left thigh that happened after he was working on the yard. ? ? ?  ? ?Home Medications ?Prior to Admission medications   ?Medication Sig Start Date End Date Taking? Authorizing Provider  ?hydrOXYzine (ATARAX) 25 MG tablet Take 1 tablet (25 mg total) by mouth every 6 (six) hours. 04/14/22  Yes Malvin Johns, MD  ?aspirin 81 MG chewable tablet Chew 81 mg by mouth daily.    [provider]  ?azelastine (ASTELIN) 0.1 % nasal spray USE 1 SPRAY INTO EACH NOSTRIL TWICE A DAY 06/02/19   [provider]  ?Biotin 2500 MCG CAPS Take 2,500 mcg by mouth daily.    [provider]  ?Blood Glucose Monitoring Suppl (ONETOUCH VERIO FLEX SYSTEM) w/Device KIT USE AS DIRECTED TO CHECK BLOOD SUGARS 10/21/20   Plotnikov, Evie Lacks, MD  ?clotrimazole-betamethasone (LOTRISONE) cream Apply 1 application topically 2 (two) times daily. 09/29/21   Shamleffer, Spirit Wernli Crazier, MD  ?Continuous Blood Gluc Sensor (FREESTYLE LIBRE 2 SENSOR) MISC 1 DEVICE BY DOES NOT APPLY ROUTE AS DIRECTED. 01/11/22   Shamleffer, Aurel Nguyen Crazier, MD  ?dapagliflozin propanediol (FARXIGA) 10 MG TABS tablet Take 1 tablet (10 mg total) by mouth daily before breakfast. 12/28/21   Shamleffer, Evita Merida Crazier, MD  ?EPINEPHrine 0.3 mg/0.3 mL IJ SOAJ injection as needed. 04/03/16   [provider]  ?famotidine (PEPCID) 20 MG tablet TAKE 1 TABLET BY MOUTH TWICE A DAY 03/29/22   Plotnikov, Evie Lacks, MD  ?fexofenadine (ALLEGRA) 180 MG tablet Take 180 mg by mouth daily. 06/16/20   [provider]  ?fluticasone (FLONASE) 50 MCG/ACT nasal spray SPRAY 1 SPRAY INTO EACH NOSTRIL EVERY DAY 06/02/19   [provider]  ?fluticasone-salmeterol (ADVAIR  DISKUS) 250-50 MCG/ACT AEPB Inhale 1 puff into the lungs in the morning and at bedtime. 03/29/22   Plotnikov, Evie Lacks, MD  ?irbesartan (AVAPRO) 150 MG tablet Take 1 tablet (150 mg total) by mouth daily. 12/26/21   Plotnikov, Evie Lacks, MD  ?metFORMIN (GLUCOPHAGE-XR) 500 MG 24 hr tablet Take 2 tablets (1,000 mg total) by mouth 2 (two) times daily. 12/28/21   Shamleffer, Arshi Duarte Crazier, MD  ?Jonetta Speak Lancets 99J MISC USE TO CHECK BLOOD SUGER TWICE A DAY 11/16/21   Plotnikov, Evie Lacks, MD  ?[facility] VERIO test strip USE AS INSTRUCTED 03/20/22   Shamleffer, Malena Timpone Crazier, MD  ?OVER THE COUNTER MEDICATION Saw Palmetto 450 mg, twice daily.    [provider]   ?OVER THE COUNTER MEDICATION Vitamin D 3 One capsule daily.    [provider]  ?PROAIR HFA 108 (90 BASE) MCG/ACT inhaler as needed. 02/26/15   [provider]  ?rosuvastatin (CRESTOR) 10 MG tablet Take 1 tablet (10 mg total) by mouth daily. 03/28/21   Plotnikov, Evie Lacks, MD  ?sildenafil (VIAGRA) 100 MG tablet TAKE 1 TABLET BY MOUTH EVERY DAY AS NEEDED FOR ERECTILE DYSFUNCTION 10/26/21   Plotnikov, Evie Lacks, MD  ?Karen Kays TO FIND Med Name: Takes allergy shots weekly    [provider]  ?   ? ?Allergies    ?Pseudoephedrine and Simvastatin   ? ?Review of Systems   ?Review of Systems  ?Constitutional:  Negative for chills, diaphoresis, fatigue and fever.  ?HENT:  Positive for facial swelling. Negative for congestion, rhinorrhea and sneezing.   ?Eyes: Negative.   ?Respiratory:  Negative for cough, chest tightness and shortness of breath.   ?Cardiovascular:  Negative for chest pain and leg swelling.  ?Gastrointestinal:  Negative for abdominal pain, blood in stool, diarrhea, nausea and vomiting.  ?Genitourinary:  Negative for difficulty urinating, flank pain, frequency and hematuria.  ?Musculoskeletal:  Negative for arthralgias and back pain.  ?Skin:  Positive for rash.  ?Neurological:  Negative for dizziness, speech difficulty, weakness, numbness and headaches.  ? ?Physical Exam ?Updated Vital Signs ?BP (!) 138/97   Pulse 69   Temp 98.6 ?F (37 ?C) (Oral)   Resp 20   Ht _0  (1.778 m)   Wt 91.2 kg   SpO2 94%   BMI 28.84 kg/m?  ?Physical Exam ?Constitutional:   ?   Appearance: He is well-developed.  ?HENT:  ?   Head: Normocephalic and atraumatic.  ?   Mouth/Throat:  ?   Comments: Mild swelling to his lips, oropharynx is clear, no trismus, uvula is midline ?Eyes:  ?   Pupils: Pupils are equal, round, and reactive to light.  ?Cardiovascular:  ?   Rate and Rhythm: Normal rate and regular rhythm.  ?   Heart sounds: Normal heart sounds.  ?Pulmonary:  ?   Effort: Pulmonary effort is normal. No  respiratory distress.  ?   Breath sounds: Normal breath sounds. No wheezing or rales.  ?Chest:  ?   Chest wall: No tenderness.  ?Abdominal:  ?   General: Bowel sounds are normal.  ?   Palpations: Abdomen is soft.  ?   Tenderness: There is no abdominal tenderness. There is no guarding or rebound.  ?Musculoskeletal:     ?   General: Normal range of motion.  ?   Cervical back: Normal range of motion and neck supple.  ?Lymphadenopathy:  ?   Cervical: No cervical adenopathy.  ?Skin: ?   General: Skin is warm and dry.  ?  Findings: Rash present.  ?   Comments: Diffuse urticarial type rash, it is blanching, no petechiae or purpura, no vesicular lesions.  Small erythematous lesion that plans in with the rest of his rash on the posterior aspect of his left thigh which is felt to be his initial bug bite.  No induration or fluctuance, no signs of infection.  ?Neurological:  ?   Mental Status: He is alert and oriented to person, place, and time.  ? ? ?ED Results / Procedures / Treatments   ?Labs ?(all labs ordered are listed, but only abnormal results are displayed) ?Labs Reviewed - No data to display ? ?EKG ?EKG Interpretation ? ?Date/Time:  Friday April 14 2022 15:59:26 EDT ?Ventricular Rate:  129 ?PR Interval:  129 ?QRS Duration: 94 ?QT Interval:  337 ?QTC Calculation: 494 ?R Axis:   240 ?Text Interpretation: Sinus or ectopic atrial tachycardia RSR' in V1 or V2, right VCD or RVH Inferior infarct, old Confirmed by Malvin Johns [phone]) on 04/14/2022 4:28:47 PM ? ?Radiology ?No results found. ? ?Procedures ?Procedures  ? ? ?Medications Ordered in ED ?Medications  ?metoprolol tartrate (LOPRESSOR) injection 5 mg (has no administration in time range)  ?dexamethasone (DECADRON) injection 10 mg (10 mg Intravenous Given 04/14/22 0951)  ?famotidine (PEPCID) IVPB 20 mg premix (0 mg Intravenous Stopped 04/14/22 1024)  ?diphenhydrAMINE (BENADRYL) injection 25 mg (25 mg Intravenous Given 04/14/22 0952)  ?EPINEPHrine (EPI-PEN) injection 0.3  mg (0.3 mg Intramuscular Given 04/14/22 0954)  ?hydrOXYzine (ATARAX) tablet 10 mg (10 mg Oral Given 04/14/22 1305)  ?sodium chloride 0.9 % bolus 1,000 mL (0 mLs Intravenous Stopped 04/14/22 1513)  ?metoprolol t

## 2022-04-14 NOTE — ED Triage Notes (Addendum)
Patient reports that he has throat tightness, rash that started yesterday. lip swelling since waking this AM. Patient states he was recently started on symbicort, started a new vial of allergy medication on 04/03/22. Methylprednisolone on 04/05/22. ? ?

## 2022-04-14 NOTE — Discharge Instructions (Signed)
Follow-up with your allergist as discussed.  Return to the emergency room if you have any worsening symptoms. ?

## 2022-04-17 ENCOUNTER — Ambulatory Visit (INDEPENDENT_AMBULATORY_CARE_PROVIDER_SITE_OTHER): Payer: Medicare Other | Admitting: Internal Medicine

## 2022-04-17 ENCOUNTER — Encounter: Payer: Self-pay | Admitting: Internal Medicine

## 2022-04-17 VITALS — BP 152/80 | HR 73 | Temp 98.3°F | Ht 70.0 in | Wt 198.0 lb

## 2022-04-17 DIAGNOSIS — I10 Essential (primary) hypertension: Secondary | ICD-10-CM | POA: Diagnosis not present

## 2022-04-17 DIAGNOSIS — T7840XA Allergy, unspecified, initial encounter: Secondary | ICD-10-CM

## 2022-04-17 DIAGNOSIS — E1165 Type 2 diabetes mellitus with hyperglycemia: Secondary | ICD-10-CM

## 2022-04-17 DIAGNOSIS — J4520 Mild intermittent asthma, uncomplicated: Secondary | ICD-10-CM | POA: Diagnosis not present

## 2022-04-17 MED ORDER — HYDROXYZINE HCL 25 MG PO TABS: 25.0000 mg | ORAL_TABLET | Freq: Four times a day (QID) | ORAL | 0 refills | Status: DC

## 2022-04-17 MED ORDER — PREDNISONE 10 MG PO TABS: ORAL_TABLET | ORAL | 0 refills | Status: DC

## 2022-04-17 MED ORDER — METHYLPREDNISOLONE ACETATE 80 MG/ML IJ SUSP
80.0000 mg | Freq: Once | INTRAMUSCULAR | Status: AC
  Administered 2022-04-17: 80 mg via INTRAMUSCULAR

## 2022-04-17 NOTE — Progress Notes (Signed)
Patient ID: Brian Ayers, male   DOB: [birth year], 71 y.o.   MRN: [id number] ? ? ? ?    Chief Complaint: follow up rash all over ? ?     HPI:  Brian Ayers is a 71 y.o. male here with c/o starting symbicort on last Thursday, but Friday had onset mod to severe rash to torso, face and some to extremities with lip swelling and sob on second dose use as well.  Changed to advair per allergy asthma provider, also seen at ED given steroid shot and hydroxyzine which helped somewhat but now rash and itching all over persists, maybe worse and hydroxyzine now run out.  Pt denies chest pain, orthopnea, PND, increased LE swelling, palpitations, dizziness or syncope.   Pt denies polydipsia, polyuria, or new focal neuro s/s.    Pt denies fever, wt loss, night sweats, loss of appetite, or other constitutional symptoms   ?      ?Wt Readings from Last 3 Encounters:  ?04/17/22 198 lb (89.8 kg)  ?04/14/22 201 lb (91.2 kg)  ?03/29/22 201 lb (91.2 kg)  ? ?BP Readings from Last 3 Encounters:  ?04/17/22 (!) 152/80  ?04/14/22 (!) 138/97  ?03/29/22 130/82  ? ?      ?Past Medical History:  ?Diagnosis Date  ? Alcohol abuse   ? hixtory of  ? Allergy   ? Asthma childhood  ? Cancer [facility])   ? skin cancer on face X1  ? Cataract   ? Chronic kidney disease   ? Diabetes mellitus without complication (Brian Ayers)   ? GERD (gastroesophageal reflux disease)   ? History of kidney stones   ? Hx of colonic polyps   ? Hyperlipidemia   ? Rhinitis   ? on allery shots  ? ?Past Surgical History:  ?Procedure Laterality Date  ? CATARACT EXTRACTION Left   ? EXTRACORPOREAL SHOCK WAVE LITHOTRIPSY Right 03/14/2018  ? Procedure: RIGHT EXTRACORPOREAL SHOCK WAVE LITHOTRIPSY (ESWL);  Surgeon: Raynelle Bring, MD;  Location: [facility] ORS;  Service: Urology;  Laterality: Right;  ? hernia with hydrocele  1973  ? left shoulder pin setting  1978  ? TONSILLECTOMY  1967  ? [place] EXTRACTION  2004  ? ? reports that he has quit smoking. He quit smokeless tobacco use about 36 years ago.   His smokeless tobacco use included snuff and chew. He reports that he does not drink alcohol and does not use drugs. ?family history includes Cancer in his mother; Hypertension in his father; Other in his mother. ?Allergies  ?Allergen Reactions  ? Pseudoephedrine   ?  REACTION: tachycardia  ? Simvastatin   ?  GERD  ? ?Current Outpatient Medications on File Prior to Visit  ?Medication Sig Dispense Refill  ? aspirin 81 MG chewable tablet Chew 81 mg by mouth daily.    ? azelastine (ASTELIN) 0.1 % nasal spray USE 1 SPRAY INTO EACH NOSTRIL TWICE A DAY    ? Biotin 2500 MCG CAPS Take 2,500 mcg by mouth daily.    ? Blood Glucose Monitoring Suppl (ONETOUCH VERIO FLEX SYSTEM) w/Device KIT USE AS DIRECTED TO CHECK BLOOD SUGARS 1 kit 0  ? clotrimazole-betamethasone (LOTRISONE) cream Apply 1 application topically 2 (two) times daily. 30 g 0  ? Continuous Blood Gluc Sensor (FREESTYLE LIBRE 2 SENSOR) MISC 1 DEVICE BY DOES NOT APPLY ROUTE AS DIRECTED. 6 each 3  ? dapagliflozin propanediol (FARXIGA) 10 MG TABS tablet Take 1 tablet (10 mg total) by mouth daily before breakfast. 90 tablet  3  ? EPINEPHrine 0.3 mg/0.3 mL IJ SOAJ injection as needed.  0  ? famotidine (PEPCID) 20 MG tablet TAKE 1 TABLET BY MOUTH TWICE A DAY 180 tablet 3  ? fexofenadine (ALLEGRA) 180 MG tablet Take 180 mg by mouth daily.    ? Fluocinolone Acetonide Body 0.01 % OIL SMARTSIG:sparingly Topical 3 Times Daily PRN    ? fluticasone (FLONASE) 50 MCG/ACT nasal spray SPRAY 1 SPRAY INTO EACH NOSTRIL EVERY DAY    ? fluticasone-salmeterol (ADVAIR HFA) 115-21 MCG/ACT inhaler Inhale 2 puffs into the lungs 2 (two) times daily.    ? irbesartan (AVAPRO) 150 MG tablet Take 1 tablet (150 mg total) by mouth daily. 90 tablet 3  ? metFORMIN (GLUCOPHAGE-XR) 500 MG 24 hr tablet Take 2 tablets (1,000 mg total) by mouth 2 (two) times daily. 360 tablet 3  ? OneTouch Delica Lancets 23N MISC USE TO CHECK BLOOD SUGER TWICE A DAY 100 each 5  ? ONETOUCH VERIO test strip USE AS INSTRUCTED  100 strip 5  ? OVER THE COUNTER MEDICATION Saw Palmetto 450 mg, twice daily.    ? OVER THE COUNTER MEDICATION Vitamin D 3 One capsule daily.    ? PROAIR HFA 108 (90 BASE) MCG/ACT inhaler as needed.  0  ? sildenafil (VIAGRA) 100 MG tablet TAKE 1 TABLET BY MOUTH EVERY DAY AS NEEDED FOR ERECTILE DYSFUNCTION 12 tablet 11  ? UNABLE TO FIND Med Name: Takes allergy shots weekly    ? rosuvastatin (CRESTOR) 10 MG tablet TAKE 1 TABLET BY MOUTH EVERY DAY 90 tablet 3  ? ?No current facility-administered medications on file prior to visit.  ? ?     ROS:  All others reviewed and negative. ? ?Objective  ? ?     PE:  BP (!) 152/80 (BP Location: Left Arm, Patient Position: Sitting, Cuff Size: Large)   Pulse 73   Temp 98.3 ?F (36.8 ?C) (Oral)   Ht _0  (1.778 m)   Wt 198 lb (89.8 kg)   SpO2 97%   BMI 28.41 kg/m?  ? ?              Constitutional: Pt appears in NAD ?              HENT: Head: NCAT.  ?              Right Ear: External ear normal.   ?              Left Ear: External ear normal.  ?              Eyes: . Pupils are equal, round, and reactive to light. Conjunctivae and EOM are normal ?              Nose: without d/c or deformity ?              Neck: Neck supple. Gross normal ROM ?              Cardiovascular: Normal rate and regular rhythm.   ?              Pulmonary/Chest: Effort normal and breath sounds without rales or wheezing.  ?              Abd:  Soft, NT, ND, + BS, no organomegaly ?              Neurological: Pt is alert. At baseline orientation, motor grossly intact ?  Skin: Skin is warm. No rashes, no other new lesions, LE edema - none ?              Psychiatric: Pt behavior is normal without agitation  ? ?Micro: none ? ?Cardiac tracings I have personally interpreted today:  none ? ?Pertinent Radiological findings (summarize): none  ? ?Lab Results  ?Component Value Date  ? WBC 7.5 03/28/2022  ? HGB 14.4 03/28/2022  ? HCT 42.4 03/28/2022  ? PLT 222.0 03/28/2022  ? GLUCOSE 133 (H) 03/28/2022  ?  CHOL 151 03/28/2022  ? TRIG 104.0 03/28/2022  ? HDL 56.30 03/28/2022  ? [place] 74 03/28/2022  ? ALT 21 03/28/2022  ? AST 18 03/28/2022  ? NA 138 03/28/2022  ? K 4.4 03/28/2022  ? CL 102 03/28/2022  ? CREATININE 1.02 03/28/2022  ? BUN 20 03/28/2022  ? CO2 27 03/28/2022  ? TSH 3.44 03/28/2022  ? PSA 0.70 03/28/2022  ? HGBA1C 7.0 (H) 03/28/2022  ? MICROALBUR 2.4 (H) 03/28/2022  ? ?Assessment/Plan:  ?WESTLEE DEVITA is a 71 y.o. White or Caucasian [1] male with  has a past medical history of Alcohol abuse, Allergy, Asthma (childhood), Cancer ([place]), Cataract, Chronic kidney disease, Diabetes mellitus without complication ([place]), GERD (gastroesophageal reflux disease), History of kidney stones, colonic polyps, Hyperlipidemia, and Rhinitis. ? ?Allergic reaction ?Likely symbicort related it seems, for prednisone course, refill hydroxyzine, and symbicort listed as allergy ? ?Asthma, chronic ?Pt to continue advair asd,  to f/u any worsening symptoms or concerns ? ?Diabetes type 2, controlled ([place]) ?Lab Results  ?Component Value Date  ? HGBA1C 7.0 (H) 03/28/2022  ? ?Stable, pt to continue current medical treatment metformin, farxiga ? ? ?Hypertension ?BP Readings from Last 3 Encounters:  ?04/17/22 (!) 152/80  ?04/14/22 (!) 138/97  ?03/29/22 130/82  ? ?Mild elevated, likely situational, pt to continue medical treatment avapro, cont to follow BP at home and next visit ? ?Followup: Return if symptoms worsen or fail to improve. ? ?Cathlean Cower, MD 04/18/2022 8:50 PM ?[place] ?Internal Medicine ?

## 2022-04-17 NOTE — Patient Instructions (Signed)
You had the steroid shot today ? ?Please take all new medication as prescribed - the prednisone ? ?Please continue all other medications as before, and refills have been done if requested - the hydroxyzine for itching ? ?Please have the pharmacy call with any other refills you may need ? ?Please keep your appointments with your specialists as you may have planned ? ? ? ? ?

## 2022-04-17 NOTE — Telephone Encounter (Signed)
Spoke with pt's spouse and let her know that we would cancel appt. Told her to return call to get appt rescheduled when able. Nothing further needed. ?

## 2022-04-18 ENCOUNTER — Encounter: Payer: Self-pay | Admitting: Internal Medicine

## 2022-04-18 ENCOUNTER — Institutional Professional Consult (permissible substitution): Payer: Medicare Other | Admitting: Internal Medicine

## 2022-04-18 DIAGNOSIS — T7840XA Allergy, unspecified, initial encounter: Secondary | ICD-10-CM | POA: Insufficient documentation

## 2022-04-18 NOTE — Assessment & Plan Note (Signed)
Pt to continue advair asd,  to f/u any worsening symptoms or concerns ?

## 2022-04-18 NOTE — Assessment & Plan Note (Signed)
Lab Results  ?Component Value Date  ? HGBA1C 7.0 (H) 03/28/2022  ? ?Stable, pt to continue current medical treatment metformin, farxiga ? ?

## 2022-04-18 NOTE — Assessment & Plan Note (Signed)
Likely symbicort related it seems, for prednisone course, refill hydroxyzine, and symbicort listed as allergy ?

## 2022-04-18 NOTE — Assessment & Plan Note (Addendum)
BP Readings from Last 3 Encounters:  ?04/17/22 (!) 152/80  ?04/14/22 (!) 138/97  ?03/29/22 130/82  ? ?Mild elevated, likely situational, pt to continue medical treatment avapro, cont to follow BP at home and next visit ? ?

## 2022-04-19 ENCOUNTER — Encounter: Payer: Self-pay | Admitting: Internal Medicine

## 2022-04-19 ENCOUNTER — Other Ambulatory Visit: Payer: Self-pay | Admitting: *Deleted

## 2022-04-19 ENCOUNTER — Ambulatory Visit: Payer: Medicare Other | Admitting: Internal Medicine

## 2022-04-21 ENCOUNTER — Encounter: Payer: Self-pay | Admitting: Internal Medicine

## 2022-04-24 ENCOUNTER — Other Ambulatory Visit: Payer: Self-pay | Admitting: Internal Medicine

## 2022-04-24 NOTE — Telephone Encounter (Signed)
To pcp please 

## 2022-04-25 ENCOUNTER — Ambulatory Visit: Payer: Medicare Other | Admitting: Internal Medicine

## 2022-04-26 DIAGNOSIS — L270 Generalized skin eruption due to drugs and medicaments taken internally: Secondary | ICD-10-CM | POA: Diagnosis not present

## 2022-04-26 DIAGNOSIS — R21 Rash and other nonspecific skin eruption: Secondary | ICD-10-CM | POA: Diagnosis not present

## 2022-04-27 ENCOUNTER — Ambulatory Visit: Payer: Medicare Other | Admitting: Internal Medicine

## 2022-04-28 DIAGNOSIS — J3089 Other allergic rhinitis: Secondary | ICD-10-CM | POA: Diagnosis not present

## 2022-04-28 DIAGNOSIS — J301 Allergic rhinitis due to pollen: Secondary | ICD-10-CM | POA: Diagnosis not present

## 2022-04-28 DIAGNOSIS — J3081 Allergic rhinitis due to animal (cat) (dog) hair and dander: Secondary | ICD-10-CM | POA: Diagnosis not present

## 2022-05-01 DIAGNOSIS — J3089 Other allergic rhinitis: Secondary | ICD-10-CM | POA: Diagnosis not present

## 2022-05-01 DIAGNOSIS — J3081 Allergic rhinitis due to animal (cat) (dog) hair and dander: Secondary | ICD-10-CM | POA: Diagnosis not present

## 2022-05-01 DIAGNOSIS — J301 Allergic rhinitis due to pollen: Secondary | ICD-10-CM | POA: Diagnosis not present

## 2022-05-04 ENCOUNTER — Ambulatory Visit (AMBULATORY_SURGERY_CENTER): Payer: Medicare Other | Admitting: *Deleted

## 2022-05-04 VITALS — Ht 70.0 in | Wt 198.0 lb

## 2022-05-04 DIAGNOSIS — Z86010 Personal history of colonic polyps: Secondary | ICD-10-CM

## 2022-05-04 MED ORDER — NA SULFATE-K SULFATE-MG SULF 17.5-3.13-1.6 GM/177ML PO SOLN: 1.0000 | ORAL | 0 refills | Status: DC

## 2022-05-04 NOTE — Progress Notes (Signed)
Patient's pre-visit was done today over the phone with the patient. Name,DOB and address verified. Patient denies any allergies to Eggs and Soy. Patient denies any problems with anesthesia/sedation. Patient is not taking any diet pills or blood thinners. No home Oxygen. Insurance confirmed with patient.  Prep instructions sent to pt's MyChart (if available) & mailed to pt-pt is aware. Patient understands to call us back with any questions or concerns. Patient is aware of our care-partner policy.   EMMI education assigned to the patient for the procedure, sent to [place].   The patient is COVID-19 vaccinated.

## 2022-05-08 DIAGNOSIS — J301 Allergic rhinitis due to pollen: Secondary | ICD-10-CM | POA: Diagnosis not present

## 2022-05-08 DIAGNOSIS — J3089 Other allergic rhinitis: Secondary | ICD-10-CM | POA: Diagnosis not present

## 2022-05-08 DIAGNOSIS — J3081 Allergic rhinitis due to animal (cat) (dog) hair and dander: Secondary | ICD-10-CM | POA: Diagnosis not present

## 2022-05-16 DIAGNOSIS — J3081 Allergic rhinitis due to animal (cat) (dog) hair and dander: Secondary | ICD-10-CM | POA: Diagnosis not present

## 2022-05-16 DIAGNOSIS — J3089 Other allergic rhinitis: Secondary | ICD-10-CM | POA: Diagnosis not present

## 2022-05-16 DIAGNOSIS — J301 Allergic rhinitis due to pollen: Secondary | ICD-10-CM | POA: Diagnosis not present

## 2022-05-19 ENCOUNTER — Telehealth: Payer: Self-pay | Admitting: *Deleted

## 2022-05-22 DIAGNOSIS — J3081 Allergic rhinitis due to animal (cat) (dog) hair and dander: Secondary | ICD-10-CM | POA: Diagnosis not present

## 2022-05-22 DIAGNOSIS — J3089 Other allergic rhinitis: Secondary | ICD-10-CM | POA: Diagnosis not present

## 2022-05-22 DIAGNOSIS — J301 Allergic rhinitis due to pollen: Secondary | ICD-10-CM | POA: Diagnosis not present

## 2022-05-25 ENCOUNTER — Ambulatory Visit (AMBULATORY_SURGERY_CENTER): Payer: Medicare Other | Admitting: Internal Medicine

## 2022-05-25 ENCOUNTER — Encounter: Payer: Self-pay | Admitting: Internal Medicine

## 2022-05-25 ENCOUNTER — Other Ambulatory Visit: Payer: Self-pay | Admitting: Internal Medicine

## 2022-05-25 VITALS — BP 114/75 | HR 72 | Temp 97.5°F | Resp 11 | Ht 70.0 in | Wt 198.0 lb

## 2022-05-25 DIAGNOSIS — Z86010 Personal history of colonic polyps: Secondary | ICD-10-CM

## 2022-05-25 DIAGNOSIS — D129 Benign neoplasm of anus and anal canal: Secondary | ICD-10-CM

## 2022-05-25 DIAGNOSIS — K635 Polyp of colon: Secondary | ICD-10-CM | POA: Diagnosis not present

## 2022-05-25 DIAGNOSIS — D128 Benign neoplasm of rectum: Secondary | ICD-10-CM

## 2022-05-25 DIAGNOSIS — D123 Benign neoplasm of transverse colon: Secondary | ICD-10-CM

## 2022-05-25 DIAGNOSIS — D122 Benign neoplasm of ascending colon: Secondary | ICD-10-CM | POA: Diagnosis not present

## 2022-05-25 DIAGNOSIS — J45909 Unspecified asthma, uncomplicated: Secondary | ICD-10-CM | POA: Diagnosis not present

## 2022-05-25 DIAGNOSIS — E119 Type 2 diabetes mellitus without complications: Secondary | ICD-10-CM | POA: Diagnosis not present

## 2022-05-25 MED ORDER — SODIUM CHLORIDE 0.9 % IV SOLN: 500.0000 mL | Freq: Once | INTRAVENOUS | Status: DC

## 2022-05-25 NOTE — Op Note (Signed)
[facility] Patient Name: Brian Ayers Procedure Date: 05/25/2022 8:59 AM MRN: [id number] Endoscopist: Docia Chuck. Henrene Pastor , MD Age: 71 Referring MD:  Date of Birth: 12-23-1950 Gender: Male Account #: [id number] Procedure:                Colonoscopy with cold snare polypectomy x 3 Indications:              High risk colon cancer surveillance: Personal                            history of adenoma (10 mm or greater in size), High                            risk colon cancer surveillance: Personal history of                            adenoma with villous component, High risk colon                            cancer surveillance: Personal history of multiple                            (3 or more) adenomas. Previous examinations 2005,                            2009, 2015, 2020 Medicines:                Monitored Anesthesia Care Procedure:                Pre-Anesthesia Assessment:                           - Prior to the procedure, a History and Physical                            was performed, and patient medications and                            allergies were reviewed. The patient's tolerance of                            previous anesthesia was also reviewed. The risks                            and benefits of the procedure and the sedation                            options and risks were discussed with the patient.                            All questions were answered, and informed consent                            was obtained. Prior Anticoagulants: The patient has  taken no previous anticoagulant or antiplatelet                            agents. ASA Grade Assessment: II - A patient with                            mild systemic disease. After reviewing the risks                            and benefits, the patient was deemed in                            satisfactory condition to undergo the procedure.                           After obtaining  informed consent, the colonoscope                            was passed under direct vision. Throughout the                            procedure, the patient's blood pressure, pulse, and                            oxygen saturations were monitored continuously. The                            CF [id number] #[id number] was introduced through the anus                            and advanced to the the cecum, identified by                            appendiceal orifice and ileocecal valve. The                            ileocecal valve, appendiceal orifice, and rectum                            were photographed. The quality of the bowel                            preparation was excellent. The colonoscopy was                            performed without difficulty. The patient tolerated                            the procedure well. The bowel preparation used was                            SUPREP via split dose instruction. Scope In: 9:23:03 AM Scope Out: 9:44:35 AM Scope Withdrawal Time: 0 hours 18 minutes 36 seconds  Total Procedure  Duration: 0 hours 21 minutes 32 seconds  Findings:                 Three polyps were found in the rectum, transverse                            colon and ascending colon. The polyps were 3 to 5                            mm in size. These polyps were removed with a cold                            snare. Resection and retrieval were complete.                           Multiple diverticula were found in the left colon                            and right colon.                           The exam was otherwise without abnormality on                            direct and retroflexion views. Complications:            No immediate complications. Estimated blood loss:                            None. Estimated Blood Loss:     Estimated blood loss: none. Impression:               - Three 3 to 5 mm polyps in the rectum, in the                            transverse colon and  in the ascending colon,                            removed with a cold snare. Resected and retrieved.                           - Diverticulosis in the left colon and in the right                            colon.                           - The examination was otherwise normal on direct                            and retroflexion views. Recommendation:           - Repeat colonoscopy in 5 years for surveillance.                           - Patient has a contact  number available for                            emergencies. The signs and symptoms of potential                            delayed complications were discussed with the                            patient. Return to normal activities tomorrow.                            Written discharge instructions were provided to the                            patient.                           - Resume previous diet.                           - Continue present medications.                           - Await pathology results. Docia Chuck. Henrene Pastor, MD 05/25/2022 9:57:00 AM This report has been signed electronically.

## 2022-05-25 NOTE — Progress Notes (Signed)
Pt's states no medical or surgical changes since previsit or office visit. 

## 2022-05-25 NOTE — Progress Notes (Signed)
PT taken to PACU. Monitors in place. VSS. Report given to RN. 

## 2022-05-25 NOTE — Progress Notes (Signed)
HISTORY OF PRESENT ILLNESS:  Brian Ayers is a 71 y.o. male who presents today for surveillance colonoscopy.  He has a history of multiple adenomatous colon polyps as well as advanced adenomatous polyps including those with villous component and larger than 10 mm.  He presents today for surveillance colonoscopy.  No active complaints.  Tolerated prep well  REVIEW OF SYSTEMS:  All non-GI ROS negative. Past Medical History:  Diagnosis Date   Alcohol abuse    hixtory of   Allergy    Asthma childhood   Cancer ([place])    skin cancer on face X1   Cataract    Chronic kidney disease    Diabetes mellitus without complication (HCC)    GERD (gastroesophageal reflux disease)    History of kidney stones    Hx of colonic polyps    Hyperlipidemia    Rhinitis    on allery shots    Past Surgical History:  Procedure Laterality Date   CATARACT EXTRACTION Left 2020   COLONOSCOPY  03/04/2019   Dr.Markeya Mincy   EXTRACORPOREAL SHOCK WAVE LITHOTRIPSY Right 03/14/2018   Procedure: RIGHT EXTRACORPOREAL SHOCK WAVE LITHOTRIPSY (ESWL);  Surgeon: Raynelle Bring, MD;  Location: [facility];  Service: Urology;  Laterality: Right;   hernia with hydrocele  1973   left shoulder pin setting  [place] EXTRACTION  2004    Social History Brian Ayers  reports that he has quit smoking. He quit smokeless tobacco use about 36 years ago.  His smokeless tobacco use included snuff and chew. He reports that he does not drink alcohol and does not use drugs.  family history includes Cancer in his mother; Hypertension in his father; Other in his mother.  Allergies  Allergen Reactions   Pseudoephedrine     REACTION: tachycardia   Simvastatin     GERD       PHYSICAL EXAMINATION:  Vital signs: BP (!) 155/88   Pulse 74   Temp (!) 97.5 F (36.4 C)   Resp (!) 8   Ht '5\' 10"'$  (1.778 m)   Wt 198 lb (89.8 kg)   SpO2 99%   BMI 28.41 kg/m  General: Well-developed, well-nourished, no  acute distress HEENT: Sclerae are anicteric, conjunctiva pink. Oral mucosa intact Lungs: Clear Heart: Regular Abdomen: soft, nontender, nondistended, no obvious ascites, no peritoneal signs, normal bowel sounds. No organomegaly. Extremities: No edema Psychiatric: alert and oriented x3. Cooperative     ASSESSMENT:  History of multiple advanced adenomatous colon polyps for surveillance   PLAN:  Surveillance colonoscopy

## 2022-05-25 NOTE — Patient Instructions (Signed)
HANDOUTS ON POLYPS & DIVERTICULOSIS GIVEN TO YOU TODAY  AWAIT PATHOLOGY RESULTS ON POLYPS REMOVED          YOU HAD AN ENDOSCOPIC PROCEDURE TODAY AT [facility]:   Refer to the procedure report that was given to you for any specific questions about what was found during the examination.  If the procedure report does not answer your questions, please call your gastroenterologist to clarify.  If you requested that your care partner not be given the details of your procedure findings, then the procedure report has been included in a sealed envelope for you to review at your convenience later.  YOU SHOULD EXPECT: Some feelings of bloating in the abdomen. Passage of more gas than usual.  Walking can help get rid of the air that was put into your GI tract during the procedure and reduce the bloating. If you had a lower endoscopy (such as a colonoscopy or flexible sigmoidoscopy) you may notice spotting of blood in your stool or on the toilet paper. If you underwent a bowel prep for your procedure, you may not have a normal bowel movement for a few days.  Please Note:  You might notice some irritation and congestion in your nose or some drainage.  This is from the oxygen used during your procedure.  There is no need for concern and it should clear up in a day or so.  SYMPTOMS TO REPORT IMMEDIATELY:  Following lower endoscopy (colonoscopy or flexible sigmoidoscopy):  Excessive amounts of blood in the stool  Significant tenderness or worsening of abdominal pains  Swelling of the abdomen that is new, acute  Fever of 100F or higher   For urgent or emergent issues, a gastroenterologist can be reached at any hour by calling [phone]. Do not use MyChart messaging for urgent concerns.    DIET:  We do recommend a small meal at first, but then you may proceed to your regular diet.  Drink plenty of fluids but you should avoid alcoholic beverages for 24 hours.  ACTIVITY:  You  should plan to take it easy for the rest of today and you should NOT DRIVE or use heavy machinery until tomorrow (because of the sedation medicines used during the test).    FOLLOW UP: Our staff will call the number listed on your records 24-72 hours following your procedure to check on you and address any questions or concerns that you may have regarding the information given to you following your procedure. If we do not reach you, we will leave a message.  We will attempt to reach you two times.  During this call, we will ask if you have developed any symptoms of COVID 19. If you develop any symptoms (ie: fever, flu-like symptoms, shortness of breath, cough etc.) before then, please call [phone].  If you test positive for Covid 19 in the 2 weeks post procedure, please call and report this information to [place].    If any biopsies were taken you will be contacted by phone or by letter within the next 1-3 weeks.  Please call us at [phone] if you have not heard about the biopsies in 3 weeks.    SIGNATURES/CONFIDENTIALITY: You and/or your care partner have signed paperwork which will be entered into your electronic medical record.  These signatures attest to the fact that that the information above on your After Visit Summary has been reviewed and is understood.  Full responsibility of the confidentiality of this discharge  information lies with you and/or your care-partner.

## 2022-05-25 NOTE — Progress Notes (Signed)
Called to room to assist during endoscopic procedure.  Patient ID and intended procedure confirmed with present staff. Received instructions for my participation in the procedure from the performing physician.  

## 2022-05-26 ENCOUNTER — Telehealth: Payer: Self-pay

## 2022-05-26 DIAGNOSIS — J301 Allergic rhinitis due to pollen: Secondary | ICD-10-CM | POA: Diagnosis not present

## 2022-05-26 DIAGNOSIS — J3081 Allergic rhinitis due to animal (cat) (dog) hair and dander: Secondary | ICD-10-CM | POA: Diagnosis not present

## 2022-05-26 DIAGNOSIS — J3089 Other allergic rhinitis: Secondary | ICD-10-CM | POA: Diagnosis not present

## 2022-05-26 NOTE — Telephone Encounter (Signed)
  Follow up Call-     05/25/2022    8:18 AM  Call back number  Post procedure Call Back phone  # [phone]  Permission to leave phone message Yes     Patient questions:  Do you have a fever, pain , or abdominal swelling? No. Pain Score  0 *  Have you tolerated food without any problems? Yes.    Have you been able to return to your normal activities? Yes.    Do you have any questions about your discharge instructions: Diet   No. Medications  No. Follow up visit  No.  Do you have questions or concerns about your Care? No.  Actions: * If pain score is 4 or above: No action needed, pain <4.

## 2022-05-29 ENCOUNTER — Encounter: Payer: Self-pay | Admitting: Internal Medicine

## 2022-05-29 DIAGNOSIS — J301 Allergic rhinitis due to pollen: Secondary | ICD-10-CM | POA: Diagnosis not present

## 2022-05-29 DIAGNOSIS — J3081 Allergic rhinitis due to animal (cat) (dog) hair and dander: Secondary | ICD-10-CM | POA: Diagnosis not present

## 2022-05-29 DIAGNOSIS — J3089 Other allergic rhinitis: Secondary | ICD-10-CM | POA: Diagnosis not present

## 2022-06-02 DIAGNOSIS — L570 Actinic keratosis: Secondary | ICD-10-CM | POA: Diagnosis not present

## 2022-06-06 DIAGNOSIS — J301 Allergic rhinitis due to pollen: Secondary | ICD-10-CM | POA: Diagnosis not present

## 2022-06-06 DIAGNOSIS — J3081 Allergic rhinitis due to animal (cat) (dog) hair and dander: Secondary | ICD-10-CM | POA: Diagnosis not present

## 2022-06-06 DIAGNOSIS — J3089 Other allergic rhinitis: Secondary | ICD-10-CM | POA: Diagnosis not present

## 2022-06-12 ENCOUNTER — Ambulatory Visit (INDEPENDENT_AMBULATORY_CARE_PROVIDER_SITE_OTHER): Payer: Medicare Other

## 2022-06-12 DIAGNOSIS — J3081 Allergic rhinitis due to animal (cat) (dog) hair and dander: Secondary | ICD-10-CM | POA: Diagnosis not present

## 2022-06-12 DIAGNOSIS — Z0000 Encounter for general adult medical examination without abnormal findings: Secondary | ICD-10-CM | POA: Diagnosis not present

## 2022-06-12 DIAGNOSIS — J301 Allergic rhinitis due to pollen: Secondary | ICD-10-CM | POA: Diagnosis not present

## 2022-06-12 DIAGNOSIS — J3089 Other allergic rhinitis: Secondary | ICD-10-CM | POA: Diagnosis not present

## 2022-06-12 NOTE — Progress Notes (Addendum)
Subjective:   Brian Ayers is a 71 y.o. male who presents for an Subsequent Medicare Annual Wellness Visit.   I connected with [name] today by telephone and verified that I am speaking with the correct person using two identifiers. Location patient: home Location provider: work Persons participating in the virtual visit: patient, provider.   I discussed the limitations, risks, security and privacy concerns of performing an evaluation and management service by telephone and the availability of in person appointments. I also discussed with the patient that there may be a patient responsible charge related to this service. The patient expressed understanding and verbally consented to this telephonic visit.    Interactive audio and video telecommunications were attempted between this provider and patient, however failed, due to patient having technical difficulties OR patient did not have access to video capability.  We continued and completed visit with audio only.    Review of Systems     Cardiac Risk Factors include: advanced age (>55men, >36 women);male gender;diabetes mellitus     Objective:    Today's Vitals   There is no height or weight on file to calculate BMI.     06/12/2022    3:23 PM 04/14/2022    9:01 AM 05/31/2021    4:21 PM 03/17/2021    2:24 PM 07/15/2018    2:49 PM 03/14/2018    9:57 AM 12/02/2017   11:46 AM  Advanced Directives  Does Patient Have a Medical Advance Directive? Yes No Yes Yes Yes Yes Yes  Type of Paramedic of [place];Living will  Living will;Healthcare Power of Attorney  Living will [place];Living will Living will;Healthcare Power of Attorney  Does patient want to make changes to medical advance directive?   No - Patient declined  No - Patient declined No - Patient declined   Copy of [place] in Chart? No - copy requested  No - copy requested   No - copy requested   Would  patient like information on creating a medical advance directive?  No - Patient declined         Current Medications (verified) Outpatient Encounter Medications as of 06/12/2022  Medication Sig   albuterol (VENTOLIN HFA) 108 (90 Base) MCG/ACT inhaler Inhale into the lungs every 6 (six) hours as needed for wheezing or shortness of breath.   aspirin 81 MG chewable tablet Chew 81 mg by mouth daily.   azelastine (ASTELIN) 0.1 % nasal spray USE 1 SPRAY INTO EACH NOSTRIL TWICE A DAY   Biotin 2500 MCG CAPS Take 2,500 mcg by mouth daily.   Blood Glucose Monitoring Suppl (ONETOUCH VERIO FLEX SYSTEM) w/Device KIT USE AS DIRECTED TO CHECK BLOOD SUGARS   clotrimazole-betamethasone (LOTRISONE) cream Apply 1 application topically 2 (two) times daily.   Continuous Blood Gluc Sensor (FREESTYLE LIBRE 2 SENSOR) MISC 1 DEVICE BY DOES NOT APPLY ROUTE AS DIRECTED.   dapagliflozin propanediol (FARXIGA) 10 MG TABS tablet Take 1 tablet (10 mg total) by mouth daily before breakfast.   EPINEPHrine 0.3 mg/0.3 mL IJ SOAJ injection as needed.   famotidine (PEPCID) 20 MG tablet TAKE 1 TABLET BY MOUTH TWICE A DAY   fexofenadine (ALLEGRA) 180 MG tablet Take 180 mg by mouth daily.   Fluocinolone Acetonide Body 0.01 % OIL SMARTSIG:sparingly Topical 3 Times Daily PRN   fluticasone (FLONASE) 50 MCG/ACT nasal spray SPRAY 1 SPRAY INTO EACH NOSTRIL EVERY DAY   fluticasone-salmeterol (ADVAIR HFA) 115-21 MCG/ACT inhaler Inhale 2 puffs  into the lungs 2 (two) times daily.   irbesartan (AVAPRO) 150 MG tablet Take 1 tablet (150 mg total) by mouth daily.   metFORMIN (GLUCOPHAGE-XR) 500 MG 24 hr tablet Take 2 tablets (1,000 mg total) by mouth 2 (two) times daily.   OneTouch Delica Lancets 43X MISC USE TO CHECK BLOOD SUGER TWICE A DAY   ONETOUCH VERIO test strip USE AS INSTRUCTED   OVER THE COUNTER MEDICATION Saw Palmetto 450 mg, twice daily.   OVER THE COUNTER MEDICATION Vitamin D 3 One capsule daily.   rosuvastatin (CRESTOR) 10 MG  tablet TAKE 1 TABLET BY MOUTH EVERY DAY   sildenafil (VIAGRA) 100 MG tablet TAKE 1 TABLET BY MOUTH EVERY DAY AS NEEDED FOR ERECTILE DYSFUNCTION   UNABLE TO FIND Med Name: Takes allergy shots weekly   No facility-administered encounter medications on file as of 06/12/2022.    Allergies (verified) Pseudoephedrine and Simvastatin   History: Past Medical History:  Diagnosis Date   Alcohol abuse    hixtory of   Allergy    Asthma childhood   Cancer ([place])    skin cancer on face X1   Cataract    Chronic kidney disease    Diabetes mellitus without complication (HCC)    GERD (gastroesophageal reflux disease)    History of kidney stones    Hx of colonic polyps    Hyperlipidemia    Rhinitis    on allery shots   Past Surgical History:  Procedure Laterality Date   CATARACT EXTRACTION Left 2020   COLONOSCOPY  03/04/2019   Dr.Perry   EXTRACORPOREAL SHOCK WAVE LITHOTRIPSY Right 03/14/2018   Procedure: RIGHT EXTRACORPOREAL SHOCK WAVE LITHOTRIPSY (ESWL);  Surgeon: Raynelle Bring, MD;  Location: [facility];  Service: Urology;  Laterality: Right;   hernia with hydrocele  1973   left shoulder pin setting  [place] EXTRACTION  2004   Family History  Problem Relation Age of Onset   Cancer Mother        breast (hx of)   Other Mother        trigeminal neuralgia s/p gamma knife/ CHF   Hypertension Father    Diabetes Neg Hx    Colon cancer Neg Hx    Stomach cancer Neg Hx    Rectal cancer Neg Hx    Social History   Socioeconomic History   Marital status: Married    Spouse name: Not on file   Number of children: 0   Years of education: Not on file   Highest education level: Not on file  Occupational History   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 Marriage is in good health. 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      Regular exercise: daily/walk at gym   Caffeine use: 2 cups of coffee daily  Social Determinants of Health   Financial Resource Strain: Low Risk  (06/12/2022)   Overall Financial Resource Strain (CARDIA)    Difficulty of Paying Living Expenses: Not hard at all  Food Insecurity: No Food Insecurity (06/12/2022)   Hunger Vital Sign    Worried About Running Out of Food in the Last Year: Never true    Ran Out of Food in the Last Year: Never true  Transportation Needs: No Transportation Needs (06/12/2022)   PRAPARE - Hydrologist (Medical): No    Lack of Transportation (Non-Medical): No  Physical Activity: Sufficiently Active (06/12/2022)   Exercise Vital Sign    Days of Exercise per Week: 6 days    Minutes of Exercise per Session: 60 min  Stress: No Stress Concern Present (06/12/2022)   [place]    Feeling of Stress : Not at all  Social Connections: [place] (06/12/2022)   Social Connection and Isolation Panel [NHANES]    Frequency of Communication with Friends and Family: Three times a week    Frequency of Social Gatherings with Friends and Family: Three times a week    Attends Religious Services: More than 4 times per year    Active Member of Clubs or Organizations: Yes    Attends Music therapist: More than 4 times per year    Marital Status: Married    Tobacco Counseling Counseling given: Not Answered Tobacco comments: only for a short period in the 1980's   Clinical  Intake:  Pre-visit preparation completed: Yes  Pain : No/denies pain     Nutritional Risks: None Diabetes: No  How often do you need to have someone help you when you read instructions, pamphlets, or other written materials from your doctor or pharmacy?: 1 - Never What is the last grade level you completed in school?: college  Diabetic?yes Nutrition Risk Assessment:  Has the patient had any N/V/D within the last 2 months?  No  Does the patient have any non-healing wounds?  No  Has the patient had any unintentional weight loss or weight gain?  No   Diabetes:  Is the patient diabetic?  Yes  If diabetic, was a CBG obtained today?  No  Did the patient bring in their glucometer from home?  No  How often do you monitor your CBG's? 3 x day .   Financial Strains and Diabetes Management:  Are you having any financial strains with the device, your supplies or your medication? No .  Does the patient want to be seen by Chronic Care Management for management of their diabetes?  No  Would the patient like to be referred to a Nutritionist or for Diabetic Management?  No   Diabetic Exams:  Diabetic Eye Exam: Completed 06/2021 Diabetic Foot Exam: Overdue, Pt has been advised about the importance in completing this exam. Pt is scheduled for diabetic foot exam on next office visit .   Interpreter Needed?: No  Information entered by :: l.Issaac Shipper,LPN   Activities of Daily Living    06/12/2022    3:26 PM  In your present state of health, do you have any difficulty performing the following activities:  Hearing? 0  Vision? 0  Difficulty concentrating or making decisions? 0  Walking or climbing stairs? 0  Dressing or bathing? 0  Doing errands, shopping? 0  Preparing Food and eating ? N  Using the Toilet? N  In the past six months, have  you accidently leaked urine? N  Do you have problems with loss of bowel control? N  Managing your Medications? N  Managing your Finances? N   Housekeeping or managing your Housekeeping? N    Patient Care Team: Plotnikov, Evie Lacks, MD as PCP - General (Internal Medicine) Raynelle Bring, MD as Consulting Physician (Urology) Shamleffer, Melanie Crazier, MD as Consulting Physician (Endocrinology) Macarthur Critchley, [place] as Referring Physician (Optometry) Katy Fitch, Darlina Guys, MD as Consulting Physician (Ophthalmology) Izora Gala, MD as Consulting Physician (Otolaryngology)  Indicate any recent Medical Services you may have received from other than [facility] providers in the past year (date may be approximate).     Assessment:   This is a routine wellness examination for Brian Ayers.  Hearing/Vision screen Vision Screening - Comments:: Annual eye exams wears glasses   Dietary issues and exercise activities discussed: Current Exercise Habits: Home exercise routine, Type of exercise: walking;strength training/weights, Time (Minutes): 60, Frequency (Times/Week): 5, Weekly Exercise (Minutes/Week): 300, Intensity: Mild, Exercise limited by: None identified   Goals Addressed   None    Depression Screen    06/12/2022    3:24 PM 04/17/2022    4:00 PM 03/29/2022    9:58 AM 10/19/2021    9:43 AM 05/31/2021    4:20 PM 03/28/2021   10:20 AM 03/17/2021    2:24 PM  PHQ 2/9 Scores  PHQ - 2 Score 0 0 0 0 0 0 0  PHQ- 9 Score  0 0 0  0     Fall Risk    06/12/2022    3:24 PM 04/17/2022    4:00 PM 03/29/2022    9:57 AM 05/31/2021    4:23 PM 03/17/2021    2:24 PM  [place] in the past year? 0 0 0 0 0  Number falls in past yr: 0 0 0 0   Injury with Fall? 0 0 0 0   Risk for fall due to :   No Fall Risks No Fall Risks   Follow up Falls evaluation completed;Education provided  Falls evaluation completed Falls evaluation completed     [place]:  Any stairs in or around the home? Yes  If so, are there any without handrails? No  Home free of loose throw rugs in walkways, pet beds, electrical cords, etc? Yes   Adequate lighting in your home to reduce risk of falls? Yes   ASSISTIVE DEVICES UTILIZED TO PREVENT FALLS:  Life alert? No  Use of a cane, walker or w/c? No  Grab bars in the bathroom? No  Shower chair or bench in shower? No  Elevated toilet seat or a handicapped toilet? No     Cognitive Function:    Normal cognitive status assessed by telephone conversation  by this Nurse Health Advisor. No abnormalities found.      Immunizations Immunization History  Administered Date(s) Administered   Fluad Quad(high Dose 65+) 10/01/2019, 10/06/2020, 10/19/2021   Influenza Split 09/28/2011, 10/08/2012   Influenza Whole 10/09/2008, 10/12/2009, 10/10/2010   Influenza, High Dose Seasonal PF 10/14/2018   Influenza,inj,Quad PF,6+ Mos 10/08/2013, 09/09/2014, 10/08/2015, 10/09/2016, 10/08/2017   Moderna Covid-19 Vaccine Bivalent Booster 50yrs & up 09/23/2021   Moderna SARS-COV2 Booster Vaccination 10/31/2020, 04/29/2021   Moderna Sars-Covid-2 Vaccination 01/11/2020, 02/16/2020, 10/31/2020   Pneumococcal Conjugate-13 06/29/2015, 03/28/2021   Pneumococcal Polysaccharide-23 05/26/2014, 07/15/2019   Td 07/19/1998, 06/08/2009   Tdap 07/15/2019   Zoster, Live 06/09/2014    TDAP status: Up to date  Flu Vaccine status: Up to date  Pneumococcal vaccine status: Up to date  Covid-19 vaccine status: Completed vaccines  Qualifies for Shingles Vaccine? Yes   Zostavax completed No   Shingrix Completed?: No.    Education has been provided regarding the importance of this vaccine. Patient has been advised to call insurance company to determine out of pocket expense if they have not yet received this vaccine. Advised may also receive vaccine at local pharmacy or Health Dept. Verbalized acceptance and understanding.  Screening Tests Health Maintenance  Topic Date Due   FOOT EXAM  11/17/2021   COVID-19 Vaccine (5 - Moderna series) 01/24/2022   Zoster Vaccines- Shingrix (2 of 2) 05/24/2022    OPHTHALMOLOGY EXAM  07/01/2022   INFLUENZA VACCINE  07/18/2022   HEMOGLOBIN A1C  09/27/2022   COLONOSCOPY (Pts 45-17yrs Insurance coverage will need to be confirmed)  05/26/2027   TETANUS/TDAP  07/14/2029   Pneumonia Vaccine 61+ Years old  Completed   Hepatitis C Screening  Completed   HPV VACCINES  Aged Out    Health Maintenance  Health Maintenance Due  Topic Date Due   FOOT EXAM  11/17/2021   COVID-19 Vaccine (5 - Moderna series) 01/24/2022   Zoster Vaccines- Shingrix (2 of 2) 05/24/2022    Colorectal cancer screening: Type of screening: Colonoscopy. Completed 05/25/2022. Repeat every 5 years  Lung Cancer Screening: (Low Dose CT Chest recommended if Age 64-80 years, 30 pack-year currently smoking OR have quit w/in 15years.) does not qualify.   Lung Cancer Screening Referral: n/a  Additional Screening:  Hepatitis C Screening: does not qualify;   Vision Screening: Recommended annual ophthalmology exams for early detection of glaucoma and other disorders of the eye. Is the patient up to date with their annual eye exam?  Yes  Who is the provider or what is the name of the office in which the patient attends annual eye exams? Dr.Groat  If pt is not established with a provider, would they like to be referred to a provider to establish care? No .   Dental Screening: Recommended annual dental exams for proper oral hygiene  Community Resource Referral / Chronic Care Management: CRR required this visit?  No   CCM required this visit?  No      Plan:     I have personally reviewed and noted the following in the patient's chart:   Medical and social history Use of alcohol, tobacco or illicit drugs  Current medications and supplements including opioid prescriptions. Patient is not currently taking opioid prescriptions. Functional ability and status Nutritional status Physical activity Advanced directives List of other physicians Hospitalizations, surgeries, and ER visits  in previous 12 months Vitals Screenings to include cognitive, depression, and falls Referrals and appointments  In addition, I have reviewed and discussed with patient certain preventive protocols, quality metrics, and best practice recommendations. A written personalized care plan for preventive services as well as general preventive health recommendations were provided to patient.     Randel Pigg, LPN   [id number]   Nurse Notes: none    Medical screening examination/treatment/procedure(s) were performed by non-physician practitioner and as supervising physician I was immediately available for consultation/collaboration.  I agree with above. Lew Dawes, MD

## 2022-06-21 DIAGNOSIS — J3081 Allergic rhinitis due to animal (cat) (dog) hair and dander: Secondary | ICD-10-CM | POA: Diagnosis not present

## 2022-06-21 DIAGNOSIS — J3089 Other allergic rhinitis: Secondary | ICD-10-CM | POA: Diagnosis not present

## 2022-06-21 DIAGNOSIS — J301 Allergic rhinitis due to pollen: Secondary | ICD-10-CM | POA: Diagnosis not present

## 2022-06-26 DIAGNOSIS — J3089 Other allergic rhinitis: Secondary | ICD-10-CM | POA: Diagnosis not present

## 2022-06-26 DIAGNOSIS — J3081 Allergic rhinitis due to animal (cat) (dog) hair and dander: Secondary | ICD-10-CM | POA: Diagnosis not present

## 2022-06-26 DIAGNOSIS — J301 Allergic rhinitis due to pollen: Secondary | ICD-10-CM | POA: Diagnosis not present

## 2022-06-29 ENCOUNTER — Ambulatory Visit: Payer: Medicare Other | Admitting: Internal Medicine

## 2022-06-29 ENCOUNTER — Other Ambulatory Visit (HOSPITAL_BASED_OUTPATIENT_CLINIC_OR_DEPARTMENT_OTHER): Payer: Self-pay

## 2022-06-29 MED ORDER — ZOSTER VAC RECOMB ADJUVANTED 50 MCG/0.5ML IM SUSR
INTRAMUSCULAR | 0 refills | Status: DC
  Filled 2022-06-29: qty 0.5, 1d supply, fill #0

## 2022-06-30 ENCOUNTER — Other Ambulatory Visit (HOSPITAL_BASED_OUTPATIENT_CLINIC_OR_DEPARTMENT_OTHER): Payer: Self-pay

## 2022-07-03 DIAGNOSIS — J301 Allergic rhinitis due to pollen: Secondary | ICD-10-CM | POA: Diagnosis not present

## 2022-07-03 DIAGNOSIS — J3089 Other allergic rhinitis: Secondary | ICD-10-CM | POA: Diagnosis not present

## 2022-07-03 DIAGNOSIS — J3081 Allergic rhinitis due to animal (cat) (dog) hair and dander: Secondary | ICD-10-CM | POA: Diagnosis not present

## 2022-07-04 DIAGNOSIS — H35412 Lattice degeneration of retina, left eye: Secondary | ICD-10-CM | POA: Diagnosis not present

## 2022-07-04 DIAGNOSIS — H31012 Macula scars of posterior pole (postinflammatory) (post-traumatic), left eye: Secondary | ICD-10-CM | POA: Diagnosis not present

## 2022-07-04 DIAGNOSIS — E119 Type 2 diabetes mellitus without complications: Secondary | ICD-10-CM | POA: Diagnosis not present

## 2022-07-04 DIAGNOSIS — H2511 Age-related nuclear cataract, right eye: Secondary | ICD-10-CM | POA: Diagnosis not present

## 2022-07-04 DIAGNOSIS — Z961 Presence of intraocular lens: Secondary | ICD-10-CM | POA: Diagnosis not present

## 2022-07-04 DIAGNOSIS — H43812 Vitreous degeneration, left eye: Secondary | ICD-10-CM | POA: Diagnosis not present

## 2022-07-04 LAB — HM DIABETES EYE EXAM

## 2022-07-07 ENCOUNTER — Encounter: Payer: Self-pay | Admitting: Internal Medicine

## 2022-07-07 ENCOUNTER — Other Ambulatory Visit: Payer: Self-pay

## 2022-07-07 ENCOUNTER — Ambulatory Visit (INDEPENDENT_AMBULATORY_CARE_PROVIDER_SITE_OTHER): Payer: Medicare Other | Admitting: Internal Medicine

## 2022-07-07 VITALS — BP 122/80 | HR 65 | Ht 70.0 in | Wt 196.4 lb

## 2022-07-07 DIAGNOSIS — E119 Type 2 diabetes mellitus without complications: Secondary | ICD-10-CM

## 2022-07-07 LAB — POCT GLYCOSYLATED HEMOGLOBIN (HGB A1C): Hemoglobin A1C: 6.9 % — AB (ref 4.0–5.6)

## 2022-07-07 MED ORDER — METFORMIN HCL ER 500 MG PO TB24: 1000.0000 mg | ORAL_TABLET | Freq: Two times a day (BID) | ORAL | 3 refills | Status: DC

## 2022-07-07 MED ORDER — DAPAGLIFLOZIN PROPANEDIOL 10 MG PO TABS: 10.0000 mg | ORAL_TABLET | Freq: Every day | ORAL | 3 refills | Status: DC

## 2022-07-07 NOTE — Progress Notes (Signed)
Name: Brian Ayers  Age/ Sex: 71 y.o., male   MRN/ DOB: [id number], April 12, 1951     PCP: Cassandria Anger, MD   Reason for Endocrinology Evaluation: Type 2 Diabetes Mellitus  Initial Endocrine Consultative Visit: 02/07/2021    PATIENT IDENTIFIER: Brian Ayers is a 70 y.o. male with a past medical history of  T2DM, Asthma and Dyslipidemia, with Hx of alcohol abuse. The patient has followed with Endocrinology clinic since 02/07/2021 for consultative assistance with management of his diabetes.  DIABETIC HISTORY:  Brian Ayers was diagnosed with DM in 2015. He has been on Metformin, repaglinide started 2/20222  His hemoglobin A1c has ranged from 6.7% in 2019, peaking at 7.6% in 2022.   On his intiial visit he had an A1c of 7.6 %. He was just started on Repaglinide so we continued this until 04/2021 due to recurrent hypoglycemia. We increased Metformin    Restarted Repaglinide in 06/2021 due to hyperglycemia with Bg's in 200's. But this was discontinued in 08/2021 and started on Farxiga  with an A1c 6.3 %   SUBJECTIVE:   During the last visit (08/26/2021): A1c 7.0% Continued metformin and farxiga       Today (07/07/2022): Brian Ayers is here for a follow up on diabetes management.  He checks his blood sugars multiple  times daily, through CGM. The patient has not had hypoglycemic episodes .  Weight has been stable  He recently returned from the [place] with GI symptoms but this has been improving    HOME DIABETES REGIMEN:  Metformin 500 mg 2 tabs BID  Farxiga 10 mg daily     Statin: yes ACE-I/ARB: yes     CONTINUOUS GLUCOSE MONITORING RECORD INTERPRETATION    Dates of Recording: 7/8-7/21/2023  Sensor description:freestyle libre  Results statistics:   CGM use % of time 77  Average and SD 145/14.8  Time in range   94  %  % Time Above 180 6  % Time above 250 0  % Time Below target 0   Glycemic patterns summary: Optimal BG's through the night and most of the  day, except for hyperglycemia after supper   Hyperglycemic episodes  post supper   Hypoglycemic episodes occurred n/a  Overnight periods: optimal      DIABETIC COMPLICATIONS: Microvascular complications:  Posterior vitreous detachment but no DR Denies: CKD, neuropathy  Last Eye Exam: Completed 06/2022  Macrovascular complications:   Denies: CAD, CVA, PVD   HISTORY:  Past Medical History:  Past Medical History:  Diagnosis Date   Alcohol abuse    hixtory of   Allergy    Asthma childhood   Cancer ([place])    skin cancer on face X1   Cataract    Chronic kidney disease    Diabetes mellitus without complication (HCC)    GERD (gastroesophageal reflux disease)    History of kidney stones    Hx of colonic polyps    Hyperlipidemia    Rhinitis    on allery shots   Past Surgical History:  Past Surgical History:  Procedure Laterality Date   CATARACT EXTRACTION Left 2020   COLONOSCOPY  03/04/2019   Dr.Perry   EXTRACORPOREAL SHOCK WAVE LITHOTRIPSY Right 03/14/2018   Procedure: RIGHT EXTRACORPOREAL SHOCK WAVE LITHOTRIPSY (ESWL);  Surgeon: Raynelle Bring, MD;  Location: [facility];  Service: Urology;  Laterality: Right;   hernia with hydrocele  1973   left shoulder pin setting  [place]  2004   Social History:  reports that he has quit smoking. He quit smokeless tobacco use about 36 years ago.  His smokeless tobacco use included snuff and chew. He reports that he does not drink alcohol and does not use drugs. Family History:  Family History  Problem Relation Age of Onset   Cancer Mother        breast (hx of)   Other Mother        trigeminal neuralgia s/p gamma knife/ CHF   Hypertension Father    Diabetes Neg Hx    Colon cancer Neg Hx    Stomach cancer Neg Hx    Rectal cancer Neg Hx      HOME MEDICATIONS: Allergies as of 07/07/2022       Reactions   Pseudoephedrine    REACTION: tachycardia   Simvastatin    GERD         Medication List        Accurate as of July 07, 2022 10:18 AM. If you have any questions, ask your nurse or doctor.          aspirin 81 MG chewable tablet Chew 81 mg by mouth daily.   azelastine 0.1 % nasal spray Commonly known as: ASTELIN USE 1 SPRAY INTO EACH NOSTRIL TWICE A DAY   Biotin 2500 MCG Caps Take 2,500 mcg by mouth daily.   clotrimazole-betamethasone cream Commonly known as: LOTRISONE Apply 1 application topically 2 (two) times daily.   dapagliflozin propanediol 10 MG Tabs tablet Commonly known as: Farxiga Take 1 tablet (10 mg total) by mouth daily before breakfast.   EPINEPHrine 0.3 mg/0.3 mL Soaj injection Commonly known as: EPI-PEN as needed.   famotidine 20 MG tablet Commonly known as: PEPCID TAKE 1 TABLET BY MOUTH TWICE A DAY   fexofenadine 180 MG tablet Commonly known as: ALLEGRA Take 180 mg by mouth daily.   Fluocinolone Acetonide Body 0.01 % Oil SMARTSIG:sparingly Topical 3 Times Daily PRN   fluticasone 50 MCG/ACT nasal spray Commonly known as: FLONASE SPRAY 1 SPRAY INTO EACH NOSTRIL EVERY DAY   fluticasone-salmeterol 115-21 MCG/ACT inhaler Commonly known as: ADVAIR HFA Inhale 2 puffs into the lungs 2 (two) times daily.   FreeStyle Libre 2 Sensor Misc 1 DEVICE BY DOES NOT APPLY ROUTE AS DIRECTED.   irbesartan 150 MG tablet Commonly known as: AVAPRO Take 1 tablet (150 mg total) by mouth daily.   metFORMIN 500 MG 24 hr tablet Commonly known as: GLUCOPHAGE-XR Take 2 tablets (1,000 mg total) by mouth 2 (two) times daily.   OneTouch Delica Lancets 20U Misc USE TO CHECK BLOOD SUGER TWICE A DAY   OneTouch Verio Flex System w/Device Kit USE AS DIRECTED TO CHECK BLOOD SUGARS   OneTouch Verio test strip Generic drug: glucose blood USE AS INSTRUCTED   OVER THE COUNTER MEDICATION Saw Palmetto 450 mg, twice daily.   OVER THE COUNTER MEDICATION Vitamin D 3 One capsule daily.   rosuvastatin 10 MG tablet Commonly known as:  CRESTOR TAKE 1 TABLET BY MOUTH EVERY DAY   Shingrix injection Generic drug: Zoster Vaccine Adjuvanted Inject into the muscle.   sildenafil 100 MG tablet Commonly known as: VIAGRA TAKE 1 TABLET BY MOUTH EVERY DAY AS NEEDED FOR ERECTILE DYSFUNCTION   UNABLE TO FIND Med Name: Takes allergy shots weekly   Ventolin HFA 108 (90 Base) MCG/ACT inhaler Generic drug: albuterol Inhale into the lungs every 6 (six) hours as needed for wheezing or shortness of breath.  OBJECTIVE:   Vital Signs: BP 122/80 (BP Location: Left Arm, Patient Position: Sitting, Cuff Size: Large)   Pulse 65   Ht $R'5\' 10"'mA$  (1.778 m)   Wt 196 lb 6.4 oz (89.1 kg)   SpO2 99%   BMI 28.18 kg/m   Wt Readings from Last 3 Encounters:  07/07/22 196 lb 6.4 oz (89.1 kg)  05/25/22 198 lb (89.8 kg)  05/04/22 198 lb (89.8 kg)     Exam: General: Pt appears well and is in NAD  Lungs: Clear with good BS bilat with no rales, rhonchi, or wheezes  Heart: RRR with normal S1 and S2 and no gallops; no murmurs; no rub  Abdomen: Normoactive bowel sounds, soft, nontender, without masses or organomegaly palpable  Extremities: No pretibial edema.   Neuro: MS is good with appropriate affect, pt is alert and Ox3    DM foot exam:   07/07/2022  The skin of the feet is without sores or ulcerations, patient with bilateral plantar callus formation The pedal pulses are 2+ on right and 2+ on left. The sensation is intact to a screening 5.07, 10 gram monofilament bilaterally    DATA REVIEWED:  Lab Results  Component Value Date   HGBA1C 7.0 (H) 03/28/2022   HGBA1C 6.7 (A) 12/28/2021   HGBA1C 6.6 (H) 10/03/2021   Lab Results  Component Value Date   MICROALBUR 2.4 (H) 03/28/2022   LDLCALC 74 03/28/2022   CREATININE 1.02 03/28/2022   Lab Results  Component Value Date   MICRALBCREAT 9.0 03/28/2022     Lab Results  Component Value Date   CHOL 151 03/28/2022   HDL 56.30 03/28/2022   LDLCALC 74 03/28/2022   TRIG 104.0  03/28/2022   CHOLHDL 3 03/28/2022         Latest Reference Range & Units 10/03/21 07:53  Sodium 135 - 145 mEq/L 138  Potassium 3.5 - 5.1 mEq/L 4.1  Chloride 96 - 112 mEq/L 105  CO2 19 - 32 mEq/L 23  Glucose 70 - 99 mg/dL 133 (H)  BUN 6 - 23 mg/dL 24 (H)  Creatinine 0.40 - 1.50 mg/dL 0.97  Calcium 8.4 - 10.5 mg/dL 9.1    ASSESSMENT / PLAN / RECOMMENDATIONS:   1) Type 2 Diabetes Mellitus, Optimally controlled, Without complications - Most recent A1c of 6.9 %. Goal A1c < 7.0 %.     - I have praised the pt on continued optimization of glucose control  - No change    MEDICATIONS: Continue  Metformin 500 mg, Two tablets with Breakfast and two tablets with Supper Continue  Farxiga 10 mg , 1 tablet daily    EDUCATION / INSTRUCTIONS: BG monitoring instructions: Patient is instructed to check his blood sugars 3 times a day, before meals . Call [facility] if: BG persistently < 70  I reviewed the Rule of 15 for the treatment of hypoglycemia in detail with the patient. Literature supplied.    2) Diabetic complications:  Eye: Does not have known diabetic retinopathy.  Neuro/ Feet: Does not have known diabetic peripheral neuropathy .  Renal: Patient does not have known baseline CKD. He   is  on an ACEI/ARB at present.      F/U in 6 months    Signed electronically by: Mack Guise, MD  [facility]  [address] [place], [place] [address] Phone: [phone] FAX: [phone]   CC: Cassandria Anger, MD [place] [address] Phone:  [phone]  Fax: [phone]  Return to Endocrinology clinic as below: Future Appointments  Date Time Provider [place]  07/07/2022 10:30 AM Kallyn Demarcus, Melanie Crazier, MD [facility] None  09/28/2022 10:20 AM Plotnikov, Evie Lacks, MD [facility] None

## 2022-07-07 NOTE — Patient Instructions (Signed)
-   Continue  Metformin 500 mg, Two tablets with Breakfast and two tablets with Supper - Continue Farxiga 10  mg, 1 tablet daily     HOW TO TREAT LOW BLOOD SUGARS (Blood sugar LESS THAN 70 MG/DL) Please follow the RULE OF 15 for the treatment of hypoglycemia treatment (when your (blood sugars are less than 70 mg/dL)   STEP 1: Take 15 grams of carbohydrates when your blood sugar is low, which includes:  3-4 GLUCOSE TABS  OR 3-4 OZ OF JUICE OR REGULAR SODA OR ONE TUBE OF GLUCOSE GEL    STEP 2: RECHECK blood sugar in 15 MINUTES STEP 3: If your blood sugar is still low at the 15 minute recheck --> then, go back to STEP 1 and treat AGAIN with another 15 grams of carbohydrates. 

## 2022-07-10 DIAGNOSIS — J3089 Other allergic rhinitis: Secondary | ICD-10-CM | POA: Diagnosis not present

## 2022-07-10 DIAGNOSIS — J3081 Allergic rhinitis due to animal (cat) (dog) hair and dander: Secondary | ICD-10-CM | POA: Diagnosis not present

## 2022-07-10 DIAGNOSIS — J301 Allergic rhinitis due to pollen: Secondary | ICD-10-CM | POA: Diagnosis not present

## 2022-07-17 DIAGNOSIS — J301 Allergic rhinitis due to pollen: Secondary | ICD-10-CM | POA: Diagnosis not present

## 2022-07-17 DIAGNOSIS — J3081 Allergic rhinitis due to animal (cat) (dog) hair and dander: Secondary | ICD-10-CM | POA: Diagnosis not present

## 2022-07-17 DIAGNOSIS — J3089 Other allergic rhinitis: Secondary | ICD-10-CM | POA: Diagnosis not present

## 2022-07-24 DIAGNOSIS — J3081 Allergic rhinitis due to animal (cat) (dog) hair and dander: Secondary | ICD-10-CM | POA: Diagnosis not present

## 2022-07-24 DIAGNOSIS — J3089 Other allergic rhinitis: Secondary | ICD-10-CM | POA: Diagnosis not present

## 2022-07-24 DIAGNOSIS — J301 Allergic rhinitis due to pollen: Secondary | ICD-10-CM | POA: Diagnosis not present

## 2022-07-31 DIAGNOSIS — J3081 Allergic rhinitis due to animal (cat) (dog) hair and dander: Secondary | ICD-10-CM | POA: Diagnosis not present

## 2022-07-31 DIAGNOSIS — J301 Allergic rhinitis due to pollen: Secondary | ICD-10-CM | POA: Diagnosis not present

## 2022-07-31 DIAGNOSIS — J3089 Other allergic rhinitis: Secondary | ICD-10-CM | POA: Diagnosis not present

## 2022-08-03 DIAGNOSIS — J3089 Other allergic rhinitis: Secondary | ICD-10-CM | POA: Diagnosis not present

## 2022-08-03 DIAGNOSIS — L570 Actinic keratosis: Secondary | ICD-10-CM | POA: Diagnosis not present

## 2022-08-03 DIAGNOSIS — J301 Allergic rhinitis due to pollen: Secondary | ICD-10-CM | POA: Diagnosis not present

## 2022-08-03 DIAGNOSIS — J3081 Allergic rhinitis due to animal (cat) (dog) hair and dander: Secondary | ICD-10-CM | POA: Diagnosis not present

## 2022-08-07 DIAGNOSIS — J301 Allergic rhinitis due to pollen: Secondary | ICD-10-CM | POA: Diagnosis not present

## 2022-08-07 DIAGNOSIS — J3081 Allergic rhinitis due to animal (cat) (dog) hair and dander: Secondary | ICD-10-CM | POA: Diagnosis not present

## 2022-08-07 DIAGNOSIS — J3089 Other allergic rhinitis: Secondary | ICD-10-CM | POA: Diagnosis not present

## 2022-08-14 DIAGNOSIS — J301 Allergic rhinitis due to pollen: Secondary | ICD-10-CM | POA: Diagnosis not present

## 2022-08-14 DIAGNOSIS — J3089 Other allergic rhinitis: Secondary | ICD-10-CM | POA: Diagnosis not present

## 2022-08-14 DIAGNOSIS — J3081 Allergic rhinitis due to animal (cat) (dog) hair and dander: Secondary | ICD-10-CM | POA: Diagnosis not present

## 2022-08-22 DIAGNOSIS — J301 Allergic rhinitis due to pollen: Secondary | ICD-10-CM | POA: Diagnosis not present

## 2022-08-22 DIAGNOSIS — J3089 Other allergic rhinitis: Secondary | ICD-10-CM | POA: Diagnosis not present

## 2022-08-22 DIAGNOSIS — J3081 Allergic rhinitis due to animal (cat) (dog) hair and dander: Secondary | ICD-10-CM | POA: Diagnosis not present

## 2022-08-28 DIAGNOSIS — J3081 Allergic rhinitis due to animal (cat) (dog) hair and dander: Secondary | ICD-10-CM | POA: Diagnosis not present

## 2022-08-28 DIAGNOSIS — J301 Allergic rhinitis due to pollen: Secondary | ICD-10-CM | POA: Diagnosis not present

## 2022-08-28 DIAGNOSIS — J3089 Other allergic rhinitis: Secondary | ICD-10-CM | POA: Diagnosis not present

## 2022-09-04 DIAGNOSIS — J3089 Other allergic rhinitis: Secondary | ICD-10-CM | POA: Diagnosis not present

## 2022-09-04 DIAGNOSIS — J301 Allergic rhinitis due to pollen: Secondary | ICD-10-CM | POA: Diagnosis not present

## 2022-09-04 DIAGNOSIS — J3081 Allergic rhinitis due to animal (cat) (dog) hair and dander: Secondary | ICD-10-CM | POA: Diagnosis not present

## 2022-09-11 DIAGNOSIS — J3081 Allergic rhinitis due to animal (cat) (dog) hair and dander: Secondary | ICD-10-CM | POA: Diagnosis not present

## 2022-09-11 DIAGNOSIS — J301 Allergic rhinitis due to pollen: Secondary | ICD-10-CM | POA: Diagnosis not present

## 2022-09-11 DIAGNOSIS — J3089 Other allergic rhinitis: Secondary | ICD-10-CM | POA: Diagnosis not present

## 2022-09-13 DIAGNOSIS — J3089 Other allergic rhinitis: Secondary | ICD-10-CM | POA: Diagnosis not present

## 2022-09-13 DIAGNOSIS — J301 Allergic rhinitis due to pollen: Secondary | ICD-10-CM | POA: Diagnosis not present

## 2022-09-13 DIAGNOSIS — J3081 Allergic rhinitis due to animal (cat) (dog) hair and dander: Secondary | ICD-10-CM | POA: Diagnosis not present

## 2022-09-14 NOTE — Telephone Encounter (Signed)
error 

## 2022-09-18 DIAGNOSIS — J301 Allergic rhinitis due to pollen: Secondary | ICD-10-CM | POA: Diagnosis not present

## 2022-09-18 DIAGNOSIS — J3081 Allergic rhinitis due to animal (cat) (dog) hair and dander: Secondary | ICD-10-CM | POA: Diagnosis not present

## 2022-09-18 DIAGNOSIS — J3089 Other allergic rhinitis: Secondary | ICD-10-CM | POA: Diagnosis not present

## 2022-09-20 DIAGNOSIS — J301 Allergic rhinitis due to pollen: Secondary | ICD-10-CM | POA: Diagnosis not present

## 2022-09-20 DIAGNOSIS — J3081 Allergic rhinitis due to animal (cat) (dog) hair and dander: Secondary | ICD-10-CM | POA: Diagnosis not present

## 2022-09-20 DIAGNOSIS — J3089 Other allergic rhinitis: Secondary | ICD-10-CM | POA: Diagnosis not present

## 2022-09-20 DIAGNOSIS — Z23 Encounter for immunization: Secondary | ICD-10-CM | POA: Diagnosis not present

## 2022-09-25 DIAGNOSIS — J3081 Allergic rhinitis due to animal (cat) (dog) hair and dander: Secondary | ICD-10-CM | POA: Diagnosis not present

## 2022-09-25 DIAGNOSIS — J301 Allergic rhinitis due to pollen: Secondary | ICD-10-CM | POA: Diagnosis not present

## 2022-09-25 DIAGNOSIS — J3089 Other allergic rhinitis: Secondary | ICD-10-CM | POA: Diagnosis not present

## 2022-09-28 ENCOUNTER — Encounter: Payer: Self-pay | Admitting: Internal Medicine

## 2022-09-28 ENCOUNTER — Ambulatory Visit (INDEPENDENT_AMBULATORY_CARE_PROVIDER_SITE_OTHER): Payer: Medicare Other | Admitting: Internal Medicine

## 2022-09-28 VITALS — BP 132/80 | HR 131 | Temp 98.3°F | Ht 70.0 in | Wt 200.6 lb

## 2022-09-28 DIAGNOSIS — R000 Tachycardia, unspecified: Secondary | ICD-10-CM | POA: Diagnosis not present

## 2022-09-28 DIAGNOSIS — E1165 Type 2 diabetes mellitus with hyperglycemia: Secondary | ICD-10-CM | POA: Diagnosis not present

## 2022-09-28 DIAGNOSIS — T7840XA Allergy, unspecified, initial encounter: Secondary | ICD-10-CM

## 2022-09-28 DIAGNOSIS — J4520 Mild intermittent asthma, uncomplicated: Secondary | ICD-10-CM

## 2022-09-28 DIAGNOSIS — E785 Hyperlipidemia, unspecified: Secondary | ICD-10-CM

## 2022-09-28 LAB — CBC WITH DIFFERENTIAL/PLATELET
Basophils Absolute: 0.1 10*3/uL (ref 0.0–0.1)
Basophils Relative: 0.7 % (ref 0.0–3.0)
Eosinophils Absolute: 0.5 10*3/uL (ref 0.0–0.7)
Eosinophils Relative: 6.3 % — ABNORMAL HIGH (ref 0.0–5.0)
HCT: 44 % (ref 39.0–52.0)
Hemoglobin: 14.8 g/dL (ref 13.0–17.0)
Lymphocytes Relative: 35.7 % (ref 12.0–46.0)
Lymphs Abs: 2.8 10*3/uL (ref 0.7–4.0)
MCHC: 33.6 g/dL (ref 30.0–36.0)
MCV: 91.1 fl (ref 78.0–100.0)
Monocytes Absolute: 0.7 10*3/uL (ref 0.1–1.0)
Monocytes Relative: 9.2 % (ref 3.0–12.0)
Neutro Abs: 3.7 10*3/uL (ref 1.4–7.7)
Neutrophils Relative %: 48.1 % (ref 43.0–77.0)
Platelets: 236 10*3/uL (ref 150.0–400.0)
RBC: 4.83 Mil/uL (ref 4.22–5.81)
RDW: 12.5 % (ref 11.5–15.5)
WBC: 7.7 10*3/uL (ref 4.0–10.5)

## 2022-09-28 LAB — COMPREHENSIVE METABOLIC PANEL
ALT: 23 U/L (ref 0–53)
AST: 18 U/L (ref 0–37)
Albumin: 4.6 g/dL (ref 3.5–5.2)
Alkaline Phosphatase: 61 U/L (ref 39–117)
BUN: 19 mg/dL (ref 6–23)
CO2: 26 mEq/L (ref 19–32)
Calcium: 9.7 mg/dL (ref 8.4–10.5)
Chloride: 103 mEq/L (ref 96–112)
Creatinine, Ser: 1.04 mg/dL (ref 0.40–1.50)
GFR: 73.35 mL/min (ref >60.00)
Glucose, Bld: 122 mg/dL — ABNORMAL HIGH (ref 70–99)
Potassium: 3.9 mEq/L (ref 3.5–5.1)
Sodium: 139 mEq/L (ref 135–145)
Total Bilirubin: 0.7 mg/dL (ref 0.2–1.2)
Total Protein: 7.1 g/dL (ref 6.0–8.3)

## 2022-09-28 LAB — T4, FREE: Free T4: 1.32 ng/dL (ref 0.60–1.60)

## 2022-09-28 LAB — TSH: TSH: 2.74 u[IU]/mL (ref 0.35–5.50)

## 2022-09-28 MED ORDER — PSEUDOEPHEDRINE HCL 30 MG PO TABS: 60.0000 mg | ORAL_TABLET | ORAL | 1 refills | Status: AC | PRN

## 2022-09-28 MED ORDER — DIPHENHYDRAMINE HCL 25 MG PO TABS: 50.0000 mg | ORAL_TABLET | Freq: Three times a day (TID) | ORAL | 0 refills | Status: AC | PRN

## 2022-09-28 MED ORDER — PREDNISONE 20 MG PO TABS: ORAL_TABLET | ORAL | 1 refills | Status: DC

## 2022-09-28 NOTE — Assessment & Plan Note (Signed)
On Metformin, Farxiga 10 mg/d F/u w/Dr [facility]

## 2022-09-28 NOTE — Assessment & Plan Note (Addendum)
Transient S tachy EKG HR 72 Labs w/CBC, FT4

## 2022-09-28 NOTE — Patient Instructions (Signed)
[place]

## 2022-09-28 NOTE — Assessment & Plan Note (Signed)
Medication: Crestor 10 mg

## 2022-09-28 NOTE — Assessment & Plan Note (Signed)
Anaphylactic reaction to Symbicort vs other (???) after 10 days of use. Went to ER w/rash/SOB/swollen lips. Back on Advair

## 2022-09-28 NOTE — Assessment & Plan Note (Signed)
Cont on  Advair to 250-100 bid

## 2022-09-28 NOTE — Progress Notes (Signed)
Subjective:  Patient ID: Brian Ayers, male    DOB: 11/23/1951  Age: 71 y.o. MRN: [id number]  CC: Follow-up (6 month f/u)   HPI  CARLOSDANIEL GROB presents for a reaction to Symbicort vs other (???) after 10 days of use. Went to ER w/rash/SOB/swollen lips.    Outpatient Medications Prior to Visit  Medication Sig Dispense Refill   albuterol (VENTOLIN HFA) 108 (90 Base) MCG/ACT inhaler Inhale into the lungs every 6 (six) hours as needed for wheezing or shortness of breath.     aspirin 81 MG chewable tablet Chew 81 mg by mouth daily.     azelastine (ASTELIN) 0.1 % nasal spray USE 1 SPRAY INTO EACH NOSTRIL TWICE A DAY     Biotin 2500 MCG CAPS Take 2,500 mcg by mouth daily.     Blood Glucose Monitoring Suppl (ONETOUCH VERIO FLEX SYSTEM) w/Device KIT USE AS DIRECTED TO CHECK BLOOD SUGARS 1 kit 0   clotrimazole-betamethasone (LOTRISONE) cream Apply 1 application topically 2 (two) times daily. 30 g 0   Continuous Blood Gluc Sensor (FREESTYLE LIBRE 2 SENSOR) MISC 1 DEVICE BY DOES NOT APPLY ROUTE AS DIRECTED. 6 each 3   dapagliflozin propanediol (FARXIGA) 10 MG TABS tablet Take 1 tablet (10 mg total) by mouth daily before breakfast. 90 tablet 3   EPINEPHrine 0.3 mg/0.3 mL IJ SOAJ injection as needed.  0   famotidine (PEPCID) 20 MG tablet TAKE 1 TABLET BY MOUTH TWICE A DAY 180 tablet 3   fexofenadine (ALLEGRA) 180 MG tablet Take 180 mg by mouth daily.     Fluocinolone Acetonide Body 0.01 % OIL SMARTSIG:sparingly Topical 3 Times Daily PRN     fluticasone (FLONASE) 50 MCG/ACT nasal spray SPRAY 1 SPRAY INTO EACH NOSTRIL EVERY DAY     fluticasone-salmeterol (ADVAIR HFA) 115-21 MCG/ACT inhaler Inhale 2 puffs into the lungs 2 (two) times daily.     irbesartan (AVAPRO) 150 MG tablet Take 1 tablet (150 mg total) by mouth daily. 90 tablet 3   metFORMIN (GLUCOPHAGE-XR) 500 MG 24 hr tablet Take 2 tablets (1,000 mg total) by mouth 2 (two) times daily. 360 tablet 3   OneTouch Delica Lancets 83F MISC USE TO  CHECK BLOOD SUGER TWICE A DAY 100 each 5   ONETOUCH VERIO test strip USE AS INSTRUCTED 100 strip 5   OVER THE COUNTER MEDICATION Saw Palmetto 450 mg, twice daily.     OVER THE COUNTER MEDICATION Vitamin D 3 One capsule daily.     rosuvastatin (CRESTOR) 10 MG tablet TAKE 1 TABLET BY MOUTH EVERY DAY 90 tablet 3   sildenafil (VIAGRA) 100 MG tablet TAKE 1 TABLET BY MOUTH EVERY DAY AS NEEDED FOR ERECTILE DYSFUNCTION 12 tablet 11   UNABLE TO FIND Med Name: Takes allergy shots weekly     Zoster Vaccine Adjuvanted [facility]) injection Inject into the muscle. (Patient not taking: Reported on 09/28/2022) 0.5 mL 0   No facility-administered medications prior to visit.    ROS: Review of Systems  Constitutional:  Negative for appetite change, fatigue and unexpected weight change.  HENT:  Negative for congestion, nosebleeds, sneezing, sore throat and trouble swallowing.   Eyes:  Negative for itching and visual disturbance.  Respiratory:  Negative for cough.   Cardiovascular:  Negative for chest pain, palpitations and leg swelling.  Gastrointestinal:  Negative for abdominal distention, blood in stool, diarrhea and nausea.  Genitourinary:  Negative for frequency and hematuria.  Musculoskeletal:  Negative for back pain, gait problem, joint swelling and  neck pain.  Skin:  Negative for rash.  Neurological:  Negative for dizziness, tremors, speech difficulty and weakness.  Psychiatric/Behavioral:  Negative for agitation, dysphoric mood and sleep disturbance. The patient is not nervous/anxious.     Objective:  BP 132/80 (BP Location: Left Arm)   Pulse (!) 131   Temp 98.3 F (36.8 C) (Oral)   Ht _0  (1.778 m)   Wt 200 lb 9.6 oz (91 kg)   SpO2 95%   BMI 28.78 kg/m   BP Readings from Last 3 Encounters:  09/28/22 132/80  07/07/22 122/80  05/25/22 114/75    Wt Readings from Last 3 Encounters:  09/28/22 200 lb 9.6 oz (91 kg)  07/07/22 196 lb 6.4 oz (89.1 kg)  05/25/22 198 lb (89.8 kg)     Physical Exam Constitutional:      General: He is not in acute distress.    Appearance: He is well-developed.     Comments: NAD  Eyes:     Conjunctiva/sclera: Conjunctivae normal.     Pupils: Pupils are equal, round, and reactive to light.  Neck:     Thyroid: No thyromegaly.     Vascular: No JVD.  Cardiovascular:     Rate and Rhythm: Normal rate and regular rhythm.     Heart sounds: Normal heart sounds. No murmur heard.    No friction rub. No gallop.  Pulmonary:     Effort: Pulmonary effort is normal. No respiratory distress.     Breath sounds: Normal breath sounds. No wheezing or rales.  Chest:     Chest wall: No tenderness.  Abdominal:     General: Bowel sounds are normal. There is no distension.     Palpations: Abdomen is soft. There is no mass.     Tenderness: There is no abdominal tenderness. There is no guarding or rebound.  Musculoskeletal:        General: No tenderness. Normal range of motion.     Cervical back: Normal range of motion.  Lymphadenopathy:     Cervical: No cervical adenopathy.  Skin:    General: Skin is warm and dry.     Findings: No rash.  Neurological:     Mental Status: He is alert and oriented to person, place, and time.     Cranial Nerves: No cranial nerve deficit.     Motor: No abnormal muscle tone.     Coordination: Coordination normal.     Gait: Gait normal.     Deep Tendon Reflexes: Reflexes are normal and symmetric.  Psychiatric:        Behavior: Behavior normal.        Thought Content: Thought content normal.        Judgment: Judgment normal.   HR 100-120- 72  Procedure: EKG Indication: Tachy; no chest pain Impression: NSR.  72 bpm.  No acute changes.   Lab Results  Component Value Date   WBC 7.5 03/28/2022   HGB 14.4 03/28/2022   HCT 42.4 03/28/2022   PLT 222.0 03/28/2022   GLUCOSE 133 (H) 03/28/2022   CHOL 151 03/28/2022   TRIG 104.0 03/28/2022   HDL 56.30 03/28/2022   LDLCALC 74 03/28/2022   ALT 21 03/28/2022    AST 18 03/28/2022   NA 138 03/28/2022   K 4.4 03/28/2022   CL 102 03/28/2022   CREATININE 1.02 03/28/2022   BUN 20 03/28/2022   CO2 27 03/28/2022   TSH 3.44 03/28/2022   PSA 0.70 03/28/2022   HGBA1C 6.9 (A)  07/07/2022   MICROALBUR 2.4 (H) 03/28/2022    No results found.  Assessment & Plan:   Problem List Items Addressed This Visit     Allergic reaction    Anaphylactic reaction to Symbicort vs other (???) after 10 days of use. Went to ER w/rash/SOB/swollen lips. Back on Advair      Asthma, chronic    Cont on  Advair to 250-100 bid      Relevant Medications   predniSONE (DELTASONE) 20 MG tablet   Diabetes type 2, controlled ([place]) - Primary    On Metformin, Farxiga 10 mg/d F/u w/Dr [facility]      Relevant Orders   CBC with Differential/Platelet   Comprehensive metabolic panel   T4, free   TSH   Dyslipidemia    Medication: Crestor 10 mg      Tachycardia    Transient S tachy EKG HR 72 Labs w/CBC, FT4      Relevant Orders   CBC with Differential/Platelet   Comprehensive metabolic panel   T4, free   TSH   EKG 12-Lead      Meds ordered this encounter  Medications   diphenhydrAMINE (BENADRYL ALLERGY) 25 MG tablet    Sig: Take 2 tablets (50 mg total) by mouth every 8 (eight) hours as needed for allergies (allergic reaction).    Dispense:  60 tablet    Refill:  0   predniSONE (DELTASONE) 20 MG tablet    Sig: PRN allergic reaction. Take 3 tabs a day x 1 days; then 2 tabs a day x 1 days; then 1 tabs a day x 1 days, then stop. Take pc.    Dispense:  30 tablet    Refill:  1   pseudoephedrine (SUDAFED) 30 MG tablet    Sig: Take 2 tablets (60 mg total) by mouth every 4 (four) hours as needed for congestion (allergic reaction).    Dispense:  60 tablet    Refill:  1      Follow-up: Return in about 6 months (around 03/30/2023) for Wellness Exam.  Walker Kehr, MD

## 2022-10-02 DIAGNOSIS — J3081 Allergic rhinitis due to animal (cat) (dog) hair and dander: Secondary | ICD-10-CM | POA: Diagnosis not present

## 2022-10-02 DIAGNOSIS — J301 Allergic rhinitis due to pollen: Secondary | ICD-10-CM | POA: Diagnosis not present

## 2022-10-02 DIAGNOSIS — J3089 Other allergic rhinitis: Secondary | ICD-10-CM | POA: Diagnosis not present

## 2022-10-09 DIAGNOSIS — J3081 Allergic rhinitis due to animal (cat) (dog) hair and dander: Secondary | ICD-10-CM | POA: Diagnosis not present

## 2022-10-09 DIAGNOSIS — J3089 Other allergic rhinitis: Secondary | ICD-10-CM | POA: Diagnosis not present

## 2022-10-09 DIAGNOSIS — J301 Allergic rhinitis due to pollen: Secondary | ICD-10-CM | POA: Diagnosis not present

## 2022-10-10 DIAGNOSIS — C44329 Squamous cell carcinoma of skin of other parts of face: Secondary | ICD-10-CM | POA: Diagnosis not present

## 2022-10-10 DIAGNOSIS — D485 Neoplasm of uncertain behavior of skin: Secondary | ICD-10-CM | POA: Diagnosis not present

## 2022-10-16 DIAGNOSIS — J301 Allergic rhinitis due to pollen: Secondary | ICD-10-CM | POA: Diagnosis not present

## 2022-10-16 DIAGNOSIS — J3089 Other allergic rhinitis: Secondary | ICD-10-CM | POA: Diagnosis not present

## 2022-10-16 DIAGNOSIS — J3081 Allergic rhinitis due to animal (cat) (dog) hair and dander: Secondary | ICD-10-CM | POA: Diagnosis not present

## 2022-10-18 DIAGNOSIS — C44329 Squamous cell carcinoma of skin of other parts of face: Secondary | ICD-10-CM | POA: Diagnosis not present

## 2022-10-23 DIAGNOSIS — J3089 Other allergic rhinitis: Secondary | ICD-10-CM | POA: Diagnosis not present

## 2022-10-23 DIAGNOSIS — J301 Allergic rhinitis due to pollen: Secondary | ICD-10-CM | POA: Diagnosis not present

## 2022-10-23 DIAGNOSIS — J3081 Allergic rhinitis due to animal (cat) (dog) hair and dander: Secondary | ICD-10-CM | POA: Diagnosis not present

## 2022-10-30 DIAGNOSIS — J3089 Other allergic rhinitis: Secondary | ICD-10-CM | POA: Diagnosis not present

## 2022-10-30 DIAGNOSIS — J301 Allergic rhinitis due to pollen: Secondary | ICD-10-CM | POA: Diagnosis not present

## 2022-10-30 DIAGNOSIS — J3081 Allergic rhinitis due to animal (cat) (dog) hair and dander: Secondary | ICD-10-CM | POA: Diagnosis not present

## 2022-11-06 DIAGNOSIS — J3081 Allergic rhinitis due to animal (cat) (dog) hair and dander: Secondary | ICD-10-CM | POA: Diagnosis not present

## 2022-11-06 DIAGNOSIS — J3089 Other allergic rhinitis: Secondary | ICD-10-CM | POA: Diagnosis not present

## 2022-11-06 DIAGNOSIS — J301 Allergic rhinitis due to pollen: Secondary | ICD-10-CM | POA: Diagnosis not present

## 2022-11-14 DIAGNOSIS — J301 Allergic rhinitis due to pollen: Secondary | ICD-10-CM | POA: Diagnosis not present

## 2022-11-14 DIAGNOSIS — J3081 Allergic rhinitis due to animal (cat) (dog) hair and dander: Secondary | ICD-10-CM | POA: Diagnosis not present

## 2022-11-14 DIAGNOSIS — J3089 Other allergic rhinitis: Secondary | ICD-10-CM | POA: Diagnosis not present

## 2022-11-20 DIAGNOSIS — J3089 Other allergic rhinitis: Secondary | ICD-10-CM | POA: Diagnosis not present

## 2022-11-20 DIAGNOSIS — J3081 Allergic rhinitis due to animal (cat) (dog) hair and dander: Secondary | ICD-10-CM | POA: Diagnosis not present

## 2022-11-20 DIAGNOSIS — J301 Allergic rhinitis due to pollen: Secondary | ICD-10-CM | POA: Diagnosis not present

## 2022-11-25 ENCOUNTER — Other Ambulatory Visit: Payer: Self-pay | Admitting: Internal Medicine

## 2022-11-27 ENCOUNTER — Other Ambulatory Visit: Payer: Self-pay

## 2022-11-27 DIAGNOSIS — J3081 Allergic rhinitis due to animal (cat) (dog) hair and dander: Secondary | ICD-10-CM | POA: Diagnosis not present

## 2022-11-27 DIAGNOSIS — J3089 Other allergic rhinitis: Secondary | ICD-10-CM | POA: Diagnosis not present

## 2022-11-27 DIAGNOSIS — J301 Allergic rhinitis due to pollen: Secondary | ICD-10-CM | POA: Diagnosis not present

## 2022-11-27 MED ORDER — FREESTYLE LIBRE 2 SENSOR MISC: 3 refills | Status: DC

## 2022-11-28 ENCOUNTER — Other Ambulatory Visit: Payer: Self-pay | Admitting: Internal Medicine

## 2022-11-28 ENCOUNTER — Other Ambulatory Visit: Payer: Self-pay

## 2022-11-28 MED ORDER — FREESTYLE LIBRE 2 SENSOR MISC: 3 refills | Status: DC

## 2022-11-30 ENCOUNTER — Other Ambulatory Visit: Payer: Self-pay

## 2022-11-30 ENCOUNTER — Encounter: Payer: Self-pay | Admitting: Internal Medicine

## 2022-11-30 MED ORDER — FREESTYLE LIBRE 2 READER DEVI: 0 refills | Status: DC

## 2022-12-04 DIAGNOSIS — J3081 Allergic rhinitis due to animal (cat) (dog) hair and dander: Secondary | ICD-10-CM | POA: Diagnosis not present

## 2022-12-04 DIAGNOSIS — J301 Allergic rhinitis due to pollen: Secondary | ICD-10-CM | POA: Diagnosis not present

## 2022-12-04 DIAGNOSIS — J3089 Other allergic rhinitis: Secondary | ICD-10-CM | POA: Diagnosis not present

## 2022-12-12 DIAGNOSIS — J301 Allergic rhinitis due to pollen: Secondary | ICD-10-CM | POA: Diagnosis not present

## 2022-12-12 DIAGNOSIS — J3089 Other allergic rhinitis: Secondary | ICD-10-CM | POA: Diagnosis not present

## 2022-12-12 DIAGNOSIS — J3081 Allergic rhinitis due to animal (cat) (dog) hair and dander: Secondary | ICD-10-CM | POA: Diagnosis not present

## 2022-12-17 ENCOUNTER — Other Ambulatory Visit: Payer: Self-pay | Admitting: Internal Medicine

## 2022-12-19 DIAGNOSIS — J301 Allergic rhinitis due to pollen: Secondary | ICD-10-CM | POA: Diagnosis not present

## 2022-12-19 DIAGNOSIS — J3089 Other allergic rhinitis: Secondary | ICD-10-CM | POA: Diagnosis not present

## 2022-12-19 DIAGNOSIS — J3081 Allergic rhinitis due to animal (cat) (dog) hair and dander: Secondary | ICD-10-CM | POA: Diagnosis not present

## 2022-12-20 ENCOUNTER — Other Ambulatory Visit: Payer: Self-pay | Admitting: Internal Medicine

## 2022-12-20 MED ORDER — FREESTYLE LIBRE 2 READER DEVI: 0 refills | Status: DC

## 2022-12-21 ENCOUNTER — Other Ambulatory Visit (HOSPITAL_BASED_OUTPATIENT_CLINIC_OR_DEPARTMENT_OTHER): Payer: Self-pay

## 2022-12-21 MED ORDER — AREXVY 120 MCG/0.5ML IM SUSR
INTRAMUSCULAR | 0 refills | Status: AC
  Filled 2022-12-21: qty 0.5, 1d supply, fill #0

## 2022-12-25 DIAGNOSIS — J301 Allergic rhinitis due to pollen: Secondary | ICD-10-CM | POA: Diagnosis not present

## 2022-12-25 DIAGNOSIS — J3089 Other allergic rhinitis: Secondary | ICD-10-CM | POA: Diagnosis not present

## 2022-12-25 DIAGNOSIS — J3081 Allergic rhinitis due to animal (cat) (dog) hair and dander: Secondary | ICD-10-CM | POA: Diagnosis not present

## 2022-12-27 DIAGNOSIS — J301 Allergic rhinitis due to pollen: Secondary | ICD-10-CM | POA: Diagnosis not present

## 2022-12-27 DIAGNOSIS — J3089 Other allergic rhinitis: Secondary | ICD-10-CM | POA: Diagnosis not present

## 2022-12-27 DIAGNOSIS — J4520 Mild intermittent asthma, uncomplicated: Secondary | ICD-10-CM | POA: Diagnosis not present

## 2022-12-27 DIAGNOSIS — J3081 Allergic rhinitis due to animal (cat) (dog) hair and dander: Secondary | ICD-10-CM | POA: Diagnosis not present

## 2023-01-01 DIAGNOSIS — J301 Allergic rhinitis due to pollen: Secondary | ICD-10-CM | POA: Diagnosis not present

## 2023-01-01 DIAGNOSIS — J3081 Allergic rhinitis due to animal (cat) (dog) hair and dander: Secondary | ICD-10-CM | POA: Diagnosis not present

## 2023-01-01 DIAGNOSIS — J3089 Other allergic rhinitis: Secondary | ICD-10-CM | POA: Diagnosis not present

## 2023-01-05 ENCOUNTER — Encounter: Payer: Self-pay | Admitting: Internal Medicine

## 2023-01-05 NOTE — Progress Notes (Signed)
Abstracted Diabetes Eye Test

## 2023-01-08 ENCOUNTER — Ambulatory Visit (INDEPENDENT_AMBULATORY_CARE_PROVIDER_SITE_OTHER): Payer: Medicare Other | Admitting: Internal Medicine

## 2023-01-08 ENCOUNTER — Encounter: Payer: Self-pay | Admitting: Internal Medicine

## 2023-01-08 VITALS — BP 154/92 | HR 92 | Ht 70.0 in | Wt 201.2 lb

## 2023-01-08 DIAGNOSIS — E119 Type 2 diabetes mellitus without complications: Secondary | ICD-10-CM

## 2023-01-08 DIAGNOSIS — J3081 Allergic rhinitis due to animal (cat) (dog) hair and dander: Secondary | ICD-10-CM | POA: Diagnosis not present

## 2023-01-08 DIAGNOSIS — J3089 Other allergic rhinitis: Secondary | ICD-10-CM | POA: Diagnosis not present

## 2023-01-08 DIAGNOSIS — J301 Allergic rhinitis due to pollen: Secondary | ICD-10-CM | POA: Diagnosis not present

## 2023-01-08 LAB — POCT GLYCOSYLATED HEMOGLOBIN (HGB A1C): Hemoglobin A1C: 7 % — AB (ref 4.0–5.6)

## 2023-01-08 MED ORDER — DAPAGLIFLOZIN PROPANEDIOL 10 MG PO TABS: 10.0000 mg | ORAL_TABLET | Freq: Every day | ORAL | 3 refills | Status: DC

## 2023-01-08 MED ORDER — METFORMIN HCL ER 500 MG PO TB24: 1000.0000 mg | ORAL_TABLET | Freq: Two times a day (BID) | ORAL | 3 refills | Status: DC

## 2023-01-08 NOTE — Progress Notes (Signed)
Name: Brian Ayers  Age/ Sex: 72 y.o., male   MRN/ DOB: [id number], July 25, 1951     PCP: Cassandria Anger, MD   Reason for Endocrinology Evaluation: Type 2 Diabetes Mellitus  Initial Endocrine Consultative Visit: 02/07/2021    PATIENT IDENTIFIER: Brian Ayers is a 72 y.o. male with a past medical history of  T2DM, Asthma and Dyslipidemia, with Hx of alcohol abuse. The patient has followed with Endocrinology clinic since 02/07/2021 for consultative assistance with management of his diabetes.  DIABETIC HISTORY:  Brian Ayers was diagnosed with DM in 2015. He has been on Metformin, repaglinide started 2/20222  His hemoglobin A1c has ranged from 6.7% in 2019, peaking at 7.6% in 2022.   On his intiial visit he had an A1c of 7.6 %. He was just started on Repaglinide so we continued this until 04/2021 due to recurrent hypoglycemia. We increased Metformin    Restarted Repaglinide in 06/2021 due to hyperglycemia with Bg's in 200's. But this was discontinued in 08/2021 and started on Farxiga  with an A1c 6.3 %   SUBJECTIVE:   During the last visit (07/07/2022): A1c 6.9%     Today (01/08/2023): Brian Ayers is here for a follow up on diabetes management.  He checks his blood sugars multiple  times daily, through CGM. The patient has not had hypoglycemic episodes .  Weight has been stable  Denies nausea, vomiting or diarrhea    HOME DIABETES REGIMEN:  Metformin 500 mg 2 tabs BID  Farxiga 10 mg daily     Statin: yes ACE-I/ARB: yes     CONTINUOUS GLUCOSE MONITORING RECORD INTERPRETATION    Dates of Recording: 1/9-1/22/2024  Sensor description:freestyle libre  Results statistics:   CGM use % of time 73  Average and SD 130/19.7  Time in range  95%  % Time Above 180 4  % Time above 250 0  % Time Below target 0   Glycemic patterns summary: Optimal BG's through the night and most of the day  Hyperglycemic episodes at supper time   Hypoglycemic episodes occurred  yes- but unclear of accuracy  Overnight periods: optimal      DIABETIC COMPLICATIONS: Microvascular complications:  Posterior vitreous detachment but no DR Denies: CKD, neuropathy  Last Eye Exam: Completed 06/2022  Macrovascular complications:   Denies: CAD, CVA, PVD   HISTORY:  Past Medical History:  Past Medical History:  Diagnosis Date   Alcohol abuse    hixtory of   Allergy    Asthma childhood   Cancer ([place])    skin cancer on face X1   Cataract    Chronic kidney disease    Diabetes mellitus without complication (HCC)    GERD (gastroesophageal reflux disease)    History of kidney stones    Hx of colonic polyps    Hyperlipidemia    Rhinitis    on allery shots   Past Surgical History:  Past Surgical History:  Procedure Laterality Date   CATARACT EXTRACTION Left 2020   COLONOSCOPY  03/04/2019   Dr.Perry   EXTRACORPOREAL SHOCK WAVE LITHOTRIPSY Right 03/14/2018   Procedure: RIGHT EXTRACORPOREAL SHOCK WAVE LITHOTRIPSY (ESWL);  Surgeon: Raynelle Bring, MD;  Location: [facility];  Service: Urology;  Laterality: Right;   hernia with hydrocele  1973   left shoulder pin setting  [place] EXTRACTION  2004   Social History:  reports that he has quit smoking. He quit smokeless tobacco use about 37  years ago.  His smokeless tobacco use included snuff and chew. He reports that he does not drink alcohol and does not use drugs. Family History:  Family History  Problem Relation Age of Onset   Cancer Mother        breast (hx of)   Other Mother        trigeminal neuralgia s/p gamma knife/ CHF   Hypertension Father    Diabetes Neg Hx    Colon cancer Neg Hx    Stomach cancer Neg Hx    Rectal cancer Neg Hx      HOME MEDICATIONS: Allergies as of 01/08/2023       Reactions   Symbicort [budesonide-formoterol Fumarate] Anaphylaxis   Anaphylactic reaction to Symbicort vs other (???) after 10 days of use. Went to ER w/rash/SOB/swollen lips.Using  Advair ok   Simvastatin    GERD        Medication List        Accurate as of January 08, 2023 10:53 AM. If you have any questions, ask your nurse or doctor.          Arexvy 120 MCG/0.5ML injection Generic drug: RSV vaccine recomb adjuvanted Inject into the muscle.   aspirin 81 MG chewable tablet Chew 81 mg by mouth daily.   azelastine 0.1 % nasal spray Commonly known as: ASTELIN USE 1 SPRAY INTO EACH NOSTRIL TWICE A DAY   Biotin 2500 MCG Caps Take 2,500 mcg by mouth daily.   clotrimazole-betamethasone cream Commonly known as: LOTRISONE Apply 1 application topically 2 (two) times daily.   dapagliflozin propanediol 10 MG Tabs tablet Commonly known as: Farxiga Take 1 tablet (10 mg total) by mouth daily before breakfast.   diphenhydrAMINE 25 MG tablet Commonly known as: Benadryl Allergy Take 2 tablets (50 mg total) by mouth every 8 (eight) hours as needed for allergies (allergic reaction).   EPINEPHrine 0.3 mg/0.3 mL Soaj injection Commonly known as: EPI-PEN as needed.   famotidine 20 MG tablet Commonly known as: PEPCID TAKE 1 TABLET BY MOUTH TWICE A DAY   fexofenadine 180 MG tablet Commonly known as: ALLEGRA Take 180 mg by mouth daily.   Fluocinolone Acetonide Body 0.01 % Oil SMARTSIG:sparingly Topical 3 Times Daily PRN   fluticasone 50 MCG/ACT nasal spray Commonly known as: FLONASE SPRAY 1 SPRAY INTO EACH NOSTRIL EVERY DAY   fluticasone-salmeterol 115-21 MCG/ACT inhaler Commonly known as: ADVAIR HFA Inhale 2 puffs into the lungs 2 (two) times daily.   FreeStyle Libre 2 Reader [employer] Use to check blood sugars DX E11.9   FreeStyle Libre 2 Sensor Misc Change sensors every 14 days DX E11.9   irbesartan 150 MG tablet Commonly known as: AVAPRO TAKE 1 TABLET BY MOUTH EVERY DAY   metFORMIN 500 MG 24 hr tablet Commonly known as: GLUCOPHAGE-XR Take 2 tablets (1,000 mg total) by mouth 2 (two) times daily.   OneTouch Delica Lancets 08M Misc USE TO  CHECK BLOOD SUGER TWICE A DAY   OneTouch Verio Flex System w/Device Kit USE AS DIRECTED TO CHECK BLOOD SUGARS   OneTouch Verio test strip Generic drug: glucose blood USE AS INSTRUCTED   OVER THE COUNTER MEDICATION Saw Palmetto 450 mg, twice daily.   OVER THE COUNTER MEDICATION Vitamin D 3 One capsule daily.   predniSONE 20 MG tablet Commonly known as: DELTASONE PRN allergic reaction. Take 3 tabs a day x 1 days; then 2 tabs a day x 1 days; then 1 tabs a day x 1 days, then stop. Take pc.  pseudoephedrine 30 MG tablet Commonly known as: Sudafed Take 2 tablets (60 mg total) by mouth every 4 (four) hours as needed for congestion (allergic reaction).   rosuvastatin 10 MG tablet Commonly known as: CRESTOR TAKE 1 TABLET BY MOUTH EVERY DAY   sildenafil 100 MG tablet Commonly known as: VIAGRA TAKE 1 TABLET BY MOUTH EVERY DAY AS NEEDED FOR ERECTILE DYSFUNCTION   UNABLE TO FIND Med Name: Takes allergy shots weekly   Ventolin HFA 108 (90 Base) MCG/ACT inhaler Generic drug: albuterol Inhale into the lungs every 6 (six) hours as needed for wheezing or shortness of breath.         OBJECTIVE:   Vital Signs: BP (!) 154/92 (BP Location: Left Arm, Patient Position: Sitting, Cuff Size: Normal)   Pulse 92   Ht '5\' 10"'$  (1.778 m)   Wt 201 lb 3.2 oz (91.3 kg)   SpO2 99%   BMI 28.87 kg/m   Wt Readings from Last 3 Encounters:  01/08/23 201 lb 3.2 oz (91.3 kg)  09/28/22 200 lb 9.6 oz (91 kg)  07/07/22 196 lb 6.4 oz (89.1 kg)     Exam: General: Pt appears well and is in NAD  Lungs: Clear with good BS bilat with no rales, rhonchi, or wheezes  Heart: RRR with normal S1 and S2 and no gallops; no murmurs; no rub  Abdomen: Normoactive bowel sounds, soft, nontender, without masses or organomegaly palpable  Extremities: No pretibial edema.   Neuro: MS is good with appropriate affect, pt is alert and Ox3    DM foot exam:   07/07/2022  The skin of the feet is without sores or  ulcerations, patient with bilateral plantar callus formation The pedal pulses are 2+ on right and 2+ on left. The sensation is intact to a screening 5.07, 10 gram monofilament bilaterally    DATA REVIEWED:  Lab Results  Component Value Date   HGBA1C 6.9 (A) 07/07/2022   HGBA1C 7.0 (H) 03/28/2022   HGBA1C 6.7 (A) 12/28/2021     Latest Reference Range & Units 09/28/22 11:39  Sodium 135 - 145 mEq/L 139  Potassium 3.5 - 5.1 mEq/L 3.9  Chloride 96 - 112 mEq/L 103  CO2 19 - 32 mEq/L 26  Glucose 70 - 99 mg/dL 122 (H)  BUN 6 - 23 mg/dL 19  Creatinine 0.40 - 1.50 mg/dL 1.04  Calcium 8.4 - 10.5 mg/dL 9.7  Alkaline Phosphatase 39 - 117 U/L 61  Albumin 3.5 - 5.2 g/dL 4.6  AST 0 - 37 U/L 18  ALT 0 - 53 U/L 23  Total Protein 6.0 - 8.3 g/dL 7.1  Total Bilirubin 0.2 - 1.2 mg/dL 0.7  GFR >60.00 mL/min 73.35     ASSESSMENT / PLAN / RECOMMENDATIONS:   1) Type 2 Diabetes Mellitus, Optimally controlled, Without complications - Most recent A1c of 7.0%. Goal A1c < 7.0 %.     -A1c at goal  - He has  been noted with false hypoglycemia on freestyle libre, but a confirmed fingerstick showed a BG of 115 mg/dL  - NO changes    MEDICATIONS: Continue  Metformin 500 mg, Two tablets with Breakfast and two tablets with Supper Continue  Farxiga 10 mg , 1 tablet daily    EDUCATION / INSTRUCTIONS: BG monitoring instructions: Patient is instructed to check his blood sugars 3 times a day, before meals . Call [place] [facility] if: BG persistently < 70  I reviewed the Rule of 15 for the treatment of hypoglycemia in detail  with the patient. Literature supplied.    2) Diabetic complications:  Eye: Does not have known diabetic retinopathy.  Neuro/ Feet: Does not have known diabetic peripheral neuropathy .  Renal: Patient does not have known baseline CKD. He   is  on an ACEI/ARB at present.      F/U in 6 months    Signed electronically by: Mack Guise, MD  [facility]  Endocrinology  [facility] [address] [place], [place] [address] Phone: [phone] FAX: [phone]   CC: Cassandria Anger, MD [place] [address] Phone: [phone]  Fax: [phone]  Return to Endocrinology clinic as below: Future Appointments  Date Time Provider [place]  04/04/2023 10:20 AM Plotnikov, Evie Lacks, MD [facility] None

## 2023-01-08 NOTE — Patient Instructions (Signed)
-   Continue  Metformin 500 mg, Two tablets with Breakfast and two tablets with Supper - Continue Farxiga 10  mg, 1 tablet daily     HOW TO TREAT LOW BLOOD SUGARS (Blood sugar LESS THAN 70 MG/DL) Please follow the RULE OF 15 for the treatment of hypoglycemia treatment (when your (blood sugars are less than 70 mg/dL)   STEP 1: Take 15 grams of carbohydrates when your blood sugar is low, which includes:  3-4 GLUCOSE TABS  OR 3-4 OZ OF JUICE OR REGULAR SODA OR ONE TUBE OF GLUCOSE GEL    STEP 2: RECHECK blood sugar in 15 MINUTES STEP 3: If your blood sugar is still low at the 15 minute recheck --> then, go back to STEP 1 and treat AGAIN with another 15 grams of carbohydrates.

## 2023-01-09 ENCOUNTER — Encounter: Payer: Self-pay | Admitting: Internal Medicine

## 2023-01-15 DIAGNOSIS — J3089 Other allergic rhinitis: Secondary | ICD-10-CM | POA: Diagnosis not present

## 2023-01-15 DIAGNOSIS — J301 Allergic rhinitis due to pollen: Secondary | ICD-10-CM | POA: Diagnosis not present

## 2023-01-15 DIAGNOSIS — J3081 Allergic rhinitis due to animal (cat) (dog) hair and dander: Secondary | ICD-10-CM | POA: Diagnosis not present

## 2023-01-22 DIAGNOSIS — J3089 Other allergic rhinitis: Secondary | ICD-10-CM | POA: Diagnosis not present

## 2023-01-22 DIAGNOSIS — J3081 Allergic rhinitis due to animal (cat) (dog) hair and dander: Secondary | ICD-10-CM | POA: Diagnosis not present

## 2023-01-22 DIAGNOSIS — J301 Allergic rhinitis due to pollen: Secondary | ICD-10-CM | POA: Diagnosis not present

## 2023-01-29 DIAGNOSIS — J301 Allergic rhinitis due to pollen: Secondary | ICD-10-CM | POA: Diagnosis not present

## 2023-01-29 DIAGNOSIS — J3089 Other allergic rhinitis: Secondary | ICD-10-CM | POA: Diagnosis not present

## 2023-01-29 DIAGNOSIS — J3081 Allergic rhinitis due to animal (cat) (dog) hair and dander: Secondary | ICD-10-CM | POA: Diagnosis not present

## 2023-01-31 DIAGNOSIS — J3089 Other allergic rhinitis: Secondary | ICD-10-CM | POA: Diagnosis not present

## 2023-01-31 DIAGNOSIS — J3081 Allergic rhinitis due to animal (cat) (dog) hair and dander: Secondary | ICD-10-CM | POA: Diagnosis not present

## 2023-01-31 DIAGNOSIS — J301 Allergic rhinitis due to pollen: Secondary | ICD-10-CM | POA: Diagnosis not present

## 2023-02-05 DIAGNOSIS — J3089 Other allergic rhinitis: Secondary | ICD-10-CM | POA: Diagnosis not present

## 2023-02-05 DIAGNOSIS — J301 Allergic rhinitis due to pollen: Secondary | ICD-10-CM | POA: Diagnosis not present

## 2023-02-05 DIAGNOSIS — J3081 Allergic rhinitis due to animal (cat) (dog) hair and dander: Secondary | ICD-10-CM | POA: Diagnosis not present

## 2023-02-08 DIAGNOSIS — D485 Neoplasm of uncertain behavior of skin: Secondary | ICD-10-CM | POA: Diagnosis not present

## 2023-02-08 DIAGNOSIS — L578 Other skin changes due to chronic exposure to nonionizing radiation: Secondary | ICD-10-CM | POA: Diagnosis not present

## 2023-02-08 DIAGNOSIS — D225 Melanocytic nevi of trunk: Secondary | ICD-10-CM | POA: Diagnosis not present

## 2023-02-08 DIAGNOSIS — L570 Actinic keratosis: Secondary | ICD-10-CM | POA: Diagnosis not present

## 2023-02-08 DIAGNOSIS — L821 Other seborrheic keratosis: Secondary | ICD-10-CM | POA: Diagnosis not present

## 2023-02-08 DIAGNOSIS — C44319 Basal cell carcinoma of skin of other parts of face: Secondary | ICD-10-CM | POA: Diagnosis not present

## 2023-02-08 DIAGNOSIS — Z85828 Personal history of other malignant neoplasm of skin: Secondary | ICD-10-CM | POA: Diagnosis not present

## 2023-02-08 DIAGNOSIS — D2272 Melanocytic nevi of left lower limb, including hip: Secondary | ICD-10-CM | POA: Diagnosis not present

## 2023-02-08 DIAGNOSIS — L814 Other melanin hyperpigmentation: Secondary | ICD-10-CM | POA: Diagnosis not present

## 2023-02-12 DIAGNOSIS — J301 Allergic rhinitis due to pollen: Secondary | ICD-10-CM | POA: Diagnosis not present

## 2023-02-12 DIAGNOSIS — J3081 Allergic rhinitis due to animal (cat) (dog) hair and dander: Secondary | ICD-10-CM | POA: Diagnosis not present

## 2023-02-12 DIAGNOSIS — J3089 Other allergic rhinitis: Secondary | ICD-10-CM | POA: Diagnosis not present

## 2023-02-13 ENCOUNTER — Other Ambulatory Visit: Payer: Self-pay | Admitting: Internal Medicine

## 2023-02-16 ENCOUNTER — Other Ambulatory Visit (HOSPITAL_COMMUNITY): Payer: Self-pay

## 2023-02-16 ENCOUNTER — Other Ambulatory Visit: Payer: Self-pay

## 2023-02-16 MED ORDER — FREESTYLE LIBRE 2 READER DEVI: 0 refills | Status: DC

## 2023-02-16 MED ORDER — FREESTYLE LIBRE 2 SENSOR MISC: 3 refills | Status: DC

## 2023-02-16 NOTE — Telephone Encounter (Signed)
Medication is to be processed under Medicare Part B.

## 2023-02-19 DIAGNOSIS — J301 Allergic rhinitis due to pollen: Secondary | ICD-10-CM | POA: Diagnosis not present

## 2023-02-19 DIAGNOSIS — J3089 Other allergic rhinitis: Secondary | ICD-10-CM | POA: Diagnosis not present

## 2023-02-19 DIAGNOSIS — J3081 Allergic rhinitis due to animal (cat) (dog) hair and dander: Secondary | ICD-10-CM | POA: Diagnosis not present

## 2023-02-21 ENCOUNTER — Other Ambulatory Visit: Payer: Self-pay

## 2023-02-21 ENCOUNTER — Encounter: Payer: Self-pay | Admitting: Internal Medicine

## 2023-02-21 MED ORDER — FREESTYLE LIBRE 3 READER DEVI: 1.0000 | 0 refills | Status: AC

## 2023-02-21 MED ORDER — FREESTYLE LIBRE 3 SENSOR MISC: 3 refills | Status: DC

## 2023-02-26 ENCOUNTER — Telehealth: Payer: Self-pay

## 2023-02-26 DIAGNOSIS — J301 Allergic rhinitis due to pollen: Secondary | ICD-10-CM | POA: Diagnosis not present

## 2023-02-26 DIAGNOSIS — J3081 Allergic rhinitis due to animal (cat) (dog) hair and dander: Secondary | ICD-10-CM | POA: Diagnosis not present

## 2023-02-26 DIAGNOSIS — J3089 Other allergic rhinitis: Secondary | ICD-10-CM | POA: Diagnosis not present

## 2023-02-26 NOTE — Telephone Encounter (Signed)
Patient will stop and pick up a sample of Freestyle 3

## 2023-02-26 NOTE — Telephone Encounter (Signed)
Patient picked up sample of [place] 3 today in office.

## 2023-02-27 ENCOUNTER — Other Ambulatory Visit: Payer: Self-pay

## 2023-02-27 ENCOUNTER — Other Ambulatory Visit: Payer: Self-pay | Admitting: Internal Medicine

## 2023-02-27 MED ORDER — FREESTYLE LIBRE 3 SENSOR MISC: 3 refills | Status: DC

## 2023-02-28 ENCOUNTER — Other Ambulatory Visit: Payer: Self-pay

## 2023-02-28 ENCOUNTER — Telehealth: Payer: Self-pay

## 2023-02-28 NOTE — Telephone Encounter (Signed)
Patient is wanting to process his Elenor Legato at the [facility] and states that it needs a PA. Patient states he was getting the [place] [facility] at [facility] for just a cost of  $32 dollars to him. Can we please look into this.

## 2023-03-01 ENCOUNTER — Other Ambulatory Visit (HOSPITAL_COMMUNITY): Payer: Self-pay

## 2023-03-01 DIAGNOSIS — H35412 Lattice degeneration of retina, left eye: Secondary | ICD-10-CM | POA: Diagnosis not present

## 2023-03-01 LAB — HM DIABETES EYE EXAM

## 2023-03-01 NOTE — Telephone Encounter (Signed)
Addressed in previous message, covered under Medicare B

## 2023-03-04 ENCOUNTER — Encounter: Payer: Self-pay | Admitting: Internal Medicine

## 2023-03-05 ENCOUNTER — Other Ambulatory Visit (HOSPITAL_COMMUNITY): Payer: Self-pay

## 2023-03-05 DIAGNOSIS — J3081 Allergic rhinitis due to animal (cat) (dog) hair and dander: Secondary | ICD-10-CM | POA: Diagnosis not present

## 2023-03-05 DIAGNOSIS — J3089 Other allergic rhinitis: Secondary | ICD-10-CM | POA: Diagnosis not present

## 2023-03-05 DIAGNOSIS — J301 Allergic rhinitis due to pollen: Secondary | ICD-10-CM | POA: Diagnosis not present

## 2023-03-05 NOTE — Telephone Encounter (Signed)
Stared PA through covermymeds, but it says they are unable to process.  Key [id number].  Will call the plan to follow up.

## 2023-03-05 NOTE — Telephone Encounter (Signed)
Called pt's ins to verify that the Elenor Legato has to be billed to part B. Called the pharmacy to get them to process it. She asked if the pt was on insulin. Unfortunately since he is not, his ins will not pay for the CGM.   Did he change ins recently?

## 2023-03-12 DIAGNOSIS — C44319 Basal cell carcinoma of skin of other parts of face: Secondary | ICD-10-CM | POA: Diagnosis not present

## 2023-03-12 DIAGNOSIS — J3089 Other allergic rhinitis: Secondary | ICD-10-CM | POA: Diagnosis not present

## 2023-03-12 DIAGNOSIS — J301 Allergic rhinitis due to pollen: Secondary | ICD-10-CM | POA: Diagnosis not present

## 2023-03-12 DIAGNOSIS — J3081 Allergic rhinitis due to animal (cat) (dog) hair and dander: Secondary | ICD-10-CM | POA: Diagnosis not present

## 2023-03-15 ENCOUNTER — Other Ambulatory Visit: Payer: Self-pay | Admitting: Internal Medicine

## 2023-03-19 DIAGNOSIS — J3081 Allergic rhinitis due to animal (cat) (dog) hair and dander: Secondary | ICD-10-CM | POA: Diagnosis not present

## 2023-03-19 DIAGNOSIS — J3089 Other allergic rhinitis: Secondary | ICD-10-CM | POA: Diagnosis not present

## 2023-03-19 DIAGNOSIS — J301 Allergic rhinitis due to pollen: Secondary | ICD-10-CM | POA: Diagnosis not present

## 2023-03-22 ENCOUNTER — Other Ambulatory Visit: Payer: Self-pay | Admitting: Internal Medicine

## 2023-03-26 DIAGNOSIS — J3089 Other allergic rhinitis: Secondary | ICD-10-CM | POA: Diagnosis not present

## 2023-03-26 DIAGNOSIS — J301 Allergic rhinitis due to pollen: Secondary | ICD-10-CM | POA: Diagnosis not present

## 2023-03-26 DIAGNOSIS — J3081 Allergic rhinitis due to animal (cat) (dog) hair and dander: Secondary | ICD-10-CM | POA: Diagnosis not present

## 2023-04-02 DIAGNOSIS — J301 Allergic rhinitis due to pollen: Secondary | ICD-10-CM | POA: Diagnosis not present

## 2023-04-02 DIAGNOSIS — J3081 Allergic rhinitis due to animal (cat) (dog) hair and dander: Secondary | ICD-10-CM | POA: Diagnosis not present

## 2023-04-02 DIAGNOSIS — J3089 Other allergic rhinitis: Secondary | ICD-10-CM | POA: Diagnosis not present

## 2023-04-04 ENCOUNTER — Ambulatory Visit (INDEPENDENT_AMBULATORY_CARE_PROVIDER_SITE_OTHER): Payer: Medicare Other | Admitting: Internal Medicine

## 2023-04-04 ENCOUNTER — Encounter: Payer: Self-pay | Admitting: Internal Medicine

## 2023-04-04 VITALS — BP 134/80 | HR 68 | Temp 98.3°F | Ht 70.0 in | Wt 204.0 lb

## 2023-04-04 DIAGNOSIS — E785 Hyperlipidemia, unspecified: Secondary | ICD-10-CM

## 2023-04-04 DIAGNOSIS — R030 Elevated blood-pressure reading, without diagnosis of hypertension: Secondary | ICD-10-CM

## 2023-04-04 DIAGNOSIS — K219 Gastro-esophageal reflux disease without esophagitis: Secondary | ICD-10-CM

## 2023-04-04 DIAGNOSIS — E1165 Type 2 diabetes mellitus with hyperglycemia: Secondary | ICD-10-CM | POA: Diagnosis not present

## 2023-04-04 MED ORDER — ROSUVASTATIN CALCIUM 10 MG PO TABS: 10.0000 mg | ORAL_TABLET | Freq: Every day | ORAL | 3 refills | Status: DC

## 2023-04-04 NOTE — Progress Notes (Signed)
Subjective:  Patient ID: Brian Ayers, male    DOB: 1951/11/13  Age: 72 y.o. MRN: [id number]  CC: No chief complaint on file.   HPI Brian Ayers presents for asthma, DM, allergies  Outpatient Medications Prior to Visit  Medication Sig Dispense Refill   albuterol (VENTOLIN HFA) 108 (90 Base) MCG/ACT inhaler Inhale into the lungs every 6 (six) hours as needed for wheezing or shortness of breath.     aspirin 81 MG chewable tablet Chew 81 mg by mouth daily.     azelastine (ASTELIN) 0.1 % nasal spray USE 1 SPRAY INTO EACH NOSTRIL TWICE A DAY     Biotin 2500 MCG CAPS Take 2,500 mcg by mouth daily.     Blood Glucose Monitoring Suppl (ONETOUCH VERIO FLEX SYSTEM) w/Device KIT USE AS DIRECTED TO CHECK BLOOD SUGARS 1 kit 0   clotrimazole-betamethasone (LOTRISONE) cream Apply 1 application topically 2 (two) times daily. 30 g 0   Continuous Blood Gluc Receiver (FREESTYLE LIBRE 3 READER) DEVI 1 Device by Does not apply route continuous. Use to monitor blood sugars 1 each 0   Continuous Blood Gluc Sensor (FREESTYLE LIBRE 3 SENSOR) MISC PLACE 1 SENSOR ON THE SKIN EVERY 14 DAYS. USE TO CHECK GLUCOSE CONTINUOUSLY 6 each 3   dapagliflozin propanediol (FARXIGA) 10 MG TABS tablet Take 1 tablet (10 mg total) by mouth daily before breakfast. 90 tablet 3   EPINEPHrine 0.3 mg/0.3 mL IJ SOAJ injection as needed.  0   famotidine (PEPCID) 20 MG tablet TAKE 1 TABLET BY MOUTH TWICE A DAY 180 tablet 3   fexofenadine (ALLEGRA) 180 MG tablet Take 180 mg by mouth daily.     Fluocinolone Acetonide Body 0.01 % OIL SMARTSIG:sparingly Topical 3 Times Daily PRN     fluticasone (FLONASE) 50 MCG/ACT nasal spray SPRAY 1 SPRAY INTO EACH NOSTRIL EVERY DAY     fluticasone-salmeterol (ADVAIR HFA) 115-21 MCG/ACT inhaler Inhale 2 puffs into the lungs 2 (two) times daily.     irbesartan (AVAPRO) 150 MG tablet TAKE 1 TABLET BY MOUTH EVERY DAY 90 tablet 3   metFORMIN (GLUCOPHAGE-XR) 500 MG 24 hr tablet Take 2 tablets (1,000 mg  total) by mouth 2 (two) times daily. 360 tablet 3   OneTouch Delica Lancets 30G MISC USE TO CHECK BLOOD SUGER TWICE A DAY 100 each 5   ONETOUCH VERIO test strip USE AS INSTRUCTED 100 strip 5   OVER THE COUNTER MEDICATION Saw Palmetto 450 mg, twice daily.     OVER THE COUNTER MEDICATION Vitamin D 3 One capsule daily.     predniSONE (DELTASONE) 20 MG tablet PRN allergic reaction. Take 3 tabs a day x 1 days; then 2 tabs a day x 1 days; then 1 tabs a day x 1 days, then stop. Take pc. 30 tablet 1   RSV vaccine recomb adjuvanted (AREXVY) 120 MCG/0.5ML injection Inject into the muscle. 0.5 mL 0   Saw Palmetto 450 MG CAPS Take 450 mg by mouth in the morning and at bedtime.     sildenafil (VIAGRA) 100 MG tablet TAKE 1 TABLET BY MOUTH EVERY DAY AS NEEDED FOR ERECTILE DYSFUNCTION 12 tablet 11   UNABLE TO FIND Med Name: Takes allergy shots weekly     rosuvastatin (CRESTOR) 10 MG tablet TAKE 1 TABLET BY MOUTH EVERY DAY 90 tablet 2   diphenhydrAMINE (BENADRYL ALLERGY) 25 MG tablet Take 2 tablets (50 mg total) by mouth every 8 (eight) hours as needed for allergies (allergic reaction). (Patient not taking:  Reported on 04/04/2023) 60 tablet 0   pseudoephedrine (SUDAFED) 30 MG tablet Take 2 tablets (60 mg total) by mouth every 4 (four) hours as needed for congestion (allergic reaction). (Patient not taking: Reported on 04/04/2023) 60 tablet 1   No facility-administered medications prior to visit.    ROS: Review of Systems  Constitutional:  Negative for appetite change, fatigue and unexpected weight change.  HENT:  Negative for congestion, nosebleeds, sneezing, sore throat and trouble swallowing.   Eyes:  Negative for itching and visual disturbance.  Respiratory:  Negative for cough.   Cardiovascular:  Negative for chest pain, palpitations and leg swelling.  Gastrointestinal:  Negative for abdominal distention, blood in stool, diarrhea and nausea.  Genitourinary:  Negative for frequency and hematuria.   Musculoskeletal:  Negative for back pain, gait problem, joint swelling and neck pain.  Skin:  Negative for rash.  Neurological:  Negative for dizziness, tremors, speech difficulty and weakness.  Psychiatric/Behavioral:  Negative for agitation, dysphoric mood and sleep disturbance. The patient is not nervous/anxious.     Objective:  BP 134/80 (BP Location: Right Arm, Patient Position: Sitting, Cuff Size: Normal)   Pulse 68   Temp 98.3 F (36.8 C) (Oral)   Ht  (1.778 m)   Wt 204 lb (92.5 kg)   SpO2 96%   BMI 29.27 kg/m   BP Readings from Last 3 Encounters:  04/04/23 134/80  01/08/23 (!) 154/92  09/28/22 132/80    Wt Readings from Last 3 Encounters:  04/04/23 204 lb (92.5 kg)  01/08/23 201 lb 3.2 oz (91.3 kg)  09/28/22 200 lb 9.6 oz (91 kg)    Physical Exam Constitutional:      General: He is not in acute distress.    Appearance: He is well-developed.     Comments: NAD  Eyes:     Conjunctiva/sclera: Conjunctivae normal.     Pupils: Pupils are equal, round, and reactive to light.  Neck:     Thyroid: No thyromegaly.     Vascular: No JVD.  Cardiovascular:     Rate and Rhythm: Normal rate and regular rhythm.     Heart sounds: Normal heart sounds. No murmur heard.    No friction rub. No gallop.  Pulmonary:     Effort: Pulmonary effort is normal. No respiratory distress.     Breath sounds: Normal breath sounds. No wheezing or rales.  Chest:     Chest wall: No tenderness.  Abdominal:     General: Bowel sounds are normal. There is no distension.     Palpations: Abdomen is soft. There is no mass.     Tenderness: There is no abdominal tenderness. There is no guarding or rebound.  Musculoskeletal:        General: No tenderness. Normal range of motion.     Cervical back: Normal range of motion.  Lymphadenopathy:     Cervical: No cervical adenopathy.  Skin:    General: Skin is warm and dry.     Findings: No rash.  Neurological:     Mental Status: He is alert and  oriented to person, place, and time.     Cranial Nerves: No cranial nerve deficit.     Motor: No abnormal muscle tone.     Coordination: Coordination normal.     Gait: Gait normal.     Deep Tendon Reflexes: Reflexes are normal and symmetric.  Psychiatric:        Behavior: Behavior normal.        Thought  Content: Thought content normal.        Judgment: Judgment normal.     Lab Results  Component Value Date   WBC 7.7 09/28/2022   HGB 14.8 09/28/2022   HCT 44.0 09/28/2022   PLT 236.0 09/28/2022   GLUCOSE 122 (H) 09/28/2022   CHOL 151 03/28/2022   TRIG 104.0 03/28/2022   HDL 56.30 03/28/2022   LDLCALC 74 03/28/2022   ALT 23 09/28/2022   AST 18 09/28/2022   NA 139 09/28/2022   K 3.9 09/28/2022   CL 103 09/28/2022   CREATININE 1.04 09/28/2022   BUN 19 09/28/2022   CO2 26 09/28/2022   TSH 2.74 09/28/2022   PSA 0.70 03/28/2022   HGBA1C 7.0 (A) 01/08/2023   MICROALBUR 2.4 (H) 03/28/2022    No results found.  Assessment & Plan:   Problem List Items Addressed This Visit       Digestive   GERD (gastroesophageal reflux disease)   Relevant Orders   Comprehensive metabolic panel   Lipid panel   Urinalysis   TSH   CBC with Differential/Platelet     Endocrine   Diabetes type 2, controlled - Primary   Relevant Medications   rosuvastatin (CRESTOR) 10 MG tablet   Other Relevant Orders   Comprehensive metabolic panel   Lipid panel   Urinalysis   TSH   CBC with Differential/Platelet     Other   Dyslipidemia   Relevant Medications   rosuvastatin (CRESTOR) 10 MG tablet   Other Relevant Orders   Comprehensive metabolic panel   Lipid panel   Urinalysis   TSH   CBC with Differential/Platelet   Elevated blood pressure reading without diagnosis of hypertension      Meds ordered this encounter  Medications   rosuvastatin (CRESTOR) 10 MG tablet    Sig: Take 1 tablet (10 mg total) by mouth daily.    Dispense:  90 tablet    Refill:  3      Follow-up: Return in  about 3 months (around 07/04/2023) for a follow-up visit.  Sonda Primes, MD

## 2023-04-05 ENCOUNTER — Other Ambulatory Visit (INDEPENDENT_AMBULATORY_CARE_PROVIDER_SITE_OTHER): Payer: Medicare Other

## 2023-04-05 DIAGNOSIS — E785 Hyperlipidemia, unspecified: Secondary | ICD-10-CM

## 2023-04-05 DIAGNOSIS — E1165 Type 2 diabetes mellitus with hyperglycemia: Secondary | ICD-10-CM | POA: Diagnosis not present

## 2023-04-05 DIAGNOSIS — K219 Gastro-esophageal reflux disease without esophagitis: Secondary | ICD-10-CM | POA: Diagnosis not present

## 2023-04-05 LAB — CBC WITH DIFFERENTIAL/PLATELET
Basophils Absolute: 0.1 10*3/uL (ref 0.0–0.1)
Basophils Relative: 0.8 % (ref 0.0–3.0)
Eosinophils Absolute: 0.6 10*3/uL (ref 0.0–0.7)
Eosinophils Relative: 8.1 % — ABNORMAL HIGH (ref 0.0–5.0)
HCT: 42.7 % (ref 39.0–52.0)
Hemoglobin: 14.6 g/dL (ref 13.0–17.0)
Lymphocytes Relative: 38 % (ref 12.0–46.0)
Lymphs Abs: 2.7 10*3/uL (ref 0.7–4.0)
MCHC: 34.2 g/dL (ref 30.0–36.0)
MCV: 92.4 fl (ref 78.0–100.0)
Monocytes Absolute: 0.7 10*3/uL (ref 0.1–1.0)
Monocytes Relative: 9.7 % (ref 3.0–12.0)
Neutro Abs: 3 10*3/uL (ref 1.4–7.7)
Neutrophils Relative %: 43.4 % (ref 43.0–77.0)
Platelets: 233 10*3/uL (ref 150.0–400.0)
RBC: 4.62 Mil/uL (ref 4.22–5.81)
RDW: 12.4 % (ref 11.5–15.5)
WBC: 7 10*3/uL (ref 4.0–10.5)

## 2023-04-05 LAB — LIPID PANEL
Cholesterol: 149 mg/dL (ref 0–200)
HDL: 61 mg/dL (ref >39.00)
LDL Cholesterol: 73 mg/dL (ref 0–99)
NonHDL: 87.93
Total CHOL/HDL Ratio: 2
Triglycerides: 75 mg/dL (ref 0.0–149.0)
VLDL: 15 mg/dL (ref 0.0–40.0)

## 2023-04-05 LAB — COMPREHENSIVE METABOLIC PANEL
ALT: 25 U/L (ref 0–53)
AST: 21 U/L (ref 0–37)
Albumin: 4.5 g/dL (ref 3.5–5.2)
Alkaline Phosphatase: 61 U/L (ref 39–117)
BUN: 23 mg/dL (ref 6–23)
CO2: 24 mEq/L (ref 19–32)
Calcium: 9.2 mg/dL (ref 8.4–10.5)
Chloride: 105 mEq/L (ref 96–112)
Creatinine, Ser: 0.98 mg/dL (ref 0.40–1.50)
GFR: 78.49 mL/min (ref >60.00)
Glucose, Bld: 146 mg/dL — ABNORMAL HIGH (ref 70–99)
Potassium: 4.8 mEq/L (ref 3.5–5.1)
Sodium: 137 mEq/L (ref 135–145)
Total Bilirubin: 0.8 mg/dL (ref 0.2–1.2)
Total Protein: 6.7 g/dL (ref 6.0–8.3)

## 2023-04-05 LAB — URINALYSIS
Bilirubin Urine: NEGATIVE
Ketones, ur: NEGATIVE
Leukocytes,Ua: NEGATIVE
Nitrite: NEGATIVE
Specific Gravity, Urine: >=1.03 — AB (ref 1.000–1.030)
Urine Glucose: >=1000 — AB
Urobilinogen, UA: 0.2 (ref 0.0–1.0)
pH: 6 (ref 5.0–8.0)

## 2023-04-05 LAB — TSH: TSH: 3.4 u[IU]/mL (ref 0.35–5.50)

## 2023-04-09 ENCOUNTER — Encounter: Payer: Self-pay | Admitting: Internal Medicine

## 2023-04-09 DIAGNOSIS — J3089 Other allergic rhinitis: Secondary | ICD-10-CM | POA: Diagnosis not present

## 2023-04-09 DIAGNOSIS — J3081 Allergic rhinitis due to animal (cat) (dog) hair and dander: Secondary | ICD-10-CM | POA: Diagnosis not present

## 2023-04-09 DIAGNOSIS — J301 Allergic rhinitis due to pollen: Secondary | ICD-10-CM | POA: Diagnosis not present

## 2023-04-17 DIAGNOSIS — J3081 Allergic rhinitis due to animal (cat) (dog) hair and dander: Secondary | ICD-10-CM | POA: Diagnosis not present

## 2023-04-17 DIAGNOSIS — J301 Allergic rhinitis due to pollen: Secondary | ICD-10-CM | POA: Diagnosis not present

## 2023-04-17 DIAGNOSIS — J3089 Other allergic rhinitis: Secondary | ICD-10-CM | POA: Diagnosis not present

## 2023-04-24 DIAGNOSIS — J3081 Allergic rhinitis due to animal (cat) (dog) hair and dander: Secondary | ICD-10-CM | POA: Diagnosis not present

## 2023-04-24 DIAGNOSIS — J301 Allergic rhinitis due to pollen: Secondary | ICD-10-CM | POA: Diagnosis not present

## 2023-04-24 DIAGNOSIS — J3089 Other allergic rhinitis: Secondary | ICD-10-CM | POA: Diagnosis not present

## 2023-05-02 DIAGNOSIS — J301 Allergic rhinitis due to pollen: Secondary | ICD-10-CM | POA: Diagnosis not present

## 2023-05-02 DIAGNOSIS — J3089 Other allergic rhinitis: Secondary | ICD-10-CM | POA: Diagnosis not present

## 2023-05-02 DIAGNOSIS — J3081 Allergic rhinitis due to animal (cat) (dog) hair and dander: Secondary | ICD-10-CM | POA: Diagnosis not present

## 2023-05-04 DIAGNOSIS — L570 Actinic keratosis: Secondary | ICD-10-CM | POA: Diagnosis not present

## 2023-05-07 DIAGNOSIS — J301 Allergic rhinitis due to pollen: Secondary | ICD-10-CM | POA: Diagnosis not present

## 2023-05-07 DIAGNOSIS — J3081 Allergic rhinitis due to animal (cat) (dog) hair and dander: Secondary | ICD-10-CM | POA: Diagnosis not present

## 2023-05-07 DIAGNOSIS — J3089 Other allergic rhinitis: Secondary | ICD-10-CM | POA: Diagnosis not present

## 2023-05-12 ENCOUNTER — Encounter: Payer: Self-pay | Admitting: Internal Medicine

## 2023-05-13 ENCOUNTER — Other Ambulatory Visit: Payer: Self-pay

## 2023-05-13 MED ORDER — FREESTYLE LIBRE 3 SENSOR MISC: 3 refills | Status: DC

## 2023-05-15 DIAGNOSIS — J3089 Other allergic rhinitis: Secondary | ICD-10-CM | POA: Diagnosis not present

## 2023-05-15 DIAGNOSIS — J301 Allergic rhinitis due to pollen: Secondary | ICD-10-CM | POA: Diagnosis not present

## 2023-05-15 DIAGNOSIS — J3081 Allergic rhinitis due to animal (cat) (dog) hair and dander: Secondary | ICD-10-CM | POA: Diagnosis not present

## 2023-05-21 DIAGNOSIS — J301 Allergic rhinitis due to pollen: Secondary | ICD-10-CM | POA: Diagnosis not present

## 2023-05-21 DIAGNOSIS — J3081 Allergic rhinitis due to animal (cat) (dog) hair and dander: Secondary | ICD-10-CM | POA: Diagnosis not present

## 2023-05-21 DIAGNOSIS — J3089 Other allergic rhinitis: Secondary | ICD-10-CM | POA: Diagnosis not present

## 2023-05-30 DIAGNOSIS — J3081 Allergic rhinitis due to animal (cat) (dog) hair and dander: Secondary | ICD-10-CM | POA: Diagnosis not present

## 2023-05-30 DIAGNOSIS — J301 Allergic rhinitis due to pollen: Secondary | ICD-10-CM | POA: Diagnosis not present

## 2023-05-30 DIAGNOSIS — J3089 Other allergic rhinitis: Secondary | ICD-10-CM | POA: Diagnosis not present

## 2023-06-04 DIAGNOSIS — J3089 Other allergic rhinitis: Secondary | ICD-10-CM | POA: Diagnosis not present

## 2023-06-04 DIAGNOSIS — J301 Allergic rhinitis due to pollen: Secondary | ICD-10-CM | POA: Diagnosis not present

## 2023-06-04 DIAGNOSIS — J3081 Allergic rhinitis due to animal (cat) (dog) hair and dander: Secondary | ICD-10-CM | POA: Diagnosis not present

## 2023-06-11 ENCOUNTER — Encounter: Payer: Self-pay | Admitting: Internal Medicine

## 2023-06-12 DIAGNOSIS — J3089 Other allergic rhinitis: Secondary | ICD-10-CM | POA: Diagnosis not present

## 2023-06-12 DIAGNOSIS — J301 Allergic rhinitis due to pollen: Secondary | ICD-10-CM | POA: Diagnosis not present

## 2023-06-12 DIAGNOSIS — J3081 Allergic rhinitis due to animal (cat) (dog) hair and dander: Secondary | ICD-10-CM | POA: Diagnosis not present

## 2023-06-13 NOTE — Telephone Encounter (Signed)
Pt has picked up forms

## 2023-06-15 ENCOUNTER — Ambulatory Visit (INDEPENDENT_AMBULATORY_CARE_PROVIDER_SITE_OTHER): Payer: Medicare Other | Admitting: *Deleted

## 2023-06-15 VITALS — Ht 70.0 in | Wt 199.0 lb

## 2023-06-15 DIAGNOSIS — Z0000 Encounter for general adult medical examination without abnormal findings: Secondary | ICD-10-CM | POA: Diagnosis not present

## 2023-06-15 NOTE — Patient Instructions (Addendum)
Brian Ayers , Thank you for taking time to come for your Medicare Wellness Visit. I appreciate your ongoing commitment to your health goals. Please review the following plan we discussed and let me know if I can assist you in the future.   These are the goals we discussed:  Goals       Client will verbalize knowledge of diabetes self-management as evidenced by Hgb A1C <7 or as defined by provider. (pt-stated)      My goal is to get rid of diabetes by watching my diet, sugar & carbohydrate intake.      Patient Stated (pt-stated)      Patient states his goals are to continue to exercise, managing his diabetes, eating right         This is a list of the screening recommended for you and due dates:  Health Maintenance  Topic Date Due   COVID-19 Vaccine (8 - 2023-24 season) 11/15/2022   Yearly kidney health urinalysis for diabetes  03/29/2023   Complete foot exam   07/08/2023   Hemoglobin A1C  07/09/2023   Flu Shot  07/19/2023   Eye exam for diabetics  02/29/2024   Yearly kidney function blood test for diabetes  04/04/2024   Medicare Annual Wellness Visit  06/14/2024   Colon Cancer Screening  05/26/2027   DTaP/Tdap/Td vaccine (4 - Td or Tdap) 07/14/2029   Pneumonia Vaccine  Completed   Hepatitis C Screening  Completed   Zoster (Shingles) Vaccine  Completed   HPV Vaccine  Aged Out       Health Maintenance, Male Adopting a healthy lifestyle and getting preventive care are important in promoting health and wellness. Ask your health care provider about: The right schedule for you to have regular tests and exams. Things you can do on your own to prevent diseases and keep yourself healthy. What should I know about diet, weight, and exercise? Eat a healthy diet  Eat a diet that includes plenty of vegetables, fruits, low-fat dairy products, and lean protein. Do not eat a lot of foods that are high in solid fats, added sugars, or sodium. Maintain a healthy weight Body mass index  (BMI) is a measurement that can be used to identify possible weight problems. It estimates body fat based on height and weight. Your health care provider can help determine your BMI and help you achieve or maintain a healthy weight. Get regular exercise Get regular exercise. This is one of the most important things you can do for your health. Most adults should: Exercise for at least 150 minutes each week. The exercise should increase your heart rate and make you sweat (moderate-intensity exercise). Do strengthening exercises at least twice a week. This is in addition to the moderate-intensity exercise. Spend less time sitting. Even light physical activity can be beneficial. Watch cholesterol and blood lipids Have your blood tested for lipids and cholesterol at 70 years of age, then have this test every 5 years. You may need to have your cholesterol levels checked more often if: Your lipid or cholesterol levels are high. You are older than 70 years of age. You are at high risk for heart disease. What should I know about cancer screening? Many types of cancers can be detected early and may often be prevented. Depending on your health history and family history, you may need to have cancer screening at various ages. This may include screening for: Colorectal cancer. Prostate cancer. Skin cancer. Lung cancer. What should I know about  heart disease, diabetes, and high blood pressure? Blood pressure and heart disease High blood pressure causes heart disease and increases the risk of stroke. This is more likely to develop in people who have high blood pressure readings or are overweight. Talk with your health care provider about your target blood pressure readings. Have your blood pressure checked: Every 3-5 years if you are 35-78 years of age. Every year if you are 8 years old or older. If you are between the ages of 17 and 65 and are a current or former smoker, ask your health care provider if  you should have a one-time screening for abdominal aortic aneurysm (AAA). Diabetes Have regular diabetes screenings. This checks your fasting blood sugar level. Have the screening done: Once every three years after age 78 if you are at a normal weight and have a low risk for diabetes. More often and at a younger age if you are overweight or have a high risk for diabetes. What should I know about preventing infection? Hepatitis B If you have a higher risk for hepatitis B, you should be screened for this virus. Talk with your health care provider to find out if you are at risk for hepatitis B infection. Hepatitis C Blood testing is recommended for: Everyone born from [birth year] through 1963. Anyone with known risk factors for hepatitis C. Sexually transmitted infections (STIs) You should be screened each year for STIs, including gonorrhea and chlamydia, if: You are sexually active and are younger than 70 years of age. You are older than 70 years of age and your health care provider tells you that you are at risk for this type of infection. Your sexual activity has changed since you were last screened, and you are at increased risk for chlamydia or gonorrhea. Ask your health care provider if you are at risk. Ask your health care provider about whether you are at high risk for HIV. Your health care provider may recommend a prescription medicine to help prevent HIV infection. If you choose to take medicine to prevent HIV, you should first get tested for HIV. You should then be tested every 3 months for as long as you are taking the medicine. Follow these instructions at home: Alcohol use Do not drink alcohol if your health care provider tells you not to drink. If you drink alcohol: Limit how much you have to 0-2 drinks a day. Know how much alcohol is in your drink. In the [place] one drink equals one 12 oz bottle of beer (355 mL), one 5 oz glass of wine (148 mL), or one 1 oz glass of hard liquor (44  mL). Lifestyle Do not use any products that contain nicotine or tobacco. These products include cigarettes, chewing tobacco, and vaping devices, such as e-cigarettes. If you need help quitting, ask your health care provider. Do not use street drugs. Do not share needles. Ask your health care provider for help if you need support or information about quitting drugs. General instructions Schedule regular health, dental, and eye exams. Stay current with your vaccines. Tell your health care provider if: You often feel depressed. You have ever been abused or do not feel safe at home. Summary Adopting a healthy lifestyle and getting preventive care are important in promoting health and wellness. Follow your health care provider's instructions about healthy diet, exercising, and getting tested or screened for diseases. Follow your health care provider's instructions on monitoring your cholesterol and blood pressure. This information is not intended to replace  advice given to you by your health care provider. Make sure you discuss any questions you have with your health care provider. Document Revised: 04/25/2021 Document Reviewed: 04/25/2021 [employer]vier Patient Education  2024 [employer].

## 2023-06-15 NOTE — Progress Notes (Addendum)
Subjective:   Brian Ayers is a 72 y.o. male who presents for Medicare Annual/Subsequent preventive examination.  Visit Complete: Virtual  I connected with  Brian Ayers on 06/15/23 by a audio enabled telemedicine application and verified that I am speaking with the correct person using two identifiers.  Patient Location: Home  Provider Location: Office/Clinic  I discussed the limitations of evaluation and management by telemedicine. The patient expressed understanding and agreed to proceed.  Patient Medicare AWV questionnaire was completed by the patient on 06/11/2023; I have confirmed that all information answered by patient is correct and no changes since this date.  Review of Systems    Defer to PCP  Cardiac Risk Factors include: advanced age (>16men, >72 women);diabetes mellitus;male gender     Objective:    Today's Vitals   06/15/23 1124  Weight: 199 lb (90.3 kg)  Height: 5\' 10"  (1.778 m)   Body mass index is 28.55 kg/m.     06/15/2023   11:26 AM 06/12/2022    3:23 PM 04/14/2022    9:01 AM 05/31/2021    4:21 PM 03/17/2021    2:24 PM 07/15/2018    2:49 PM 03/14/2018    9:57 AM  Advanced Directives  Does Patient Have a Medical Advance Directive? Yes Yes No Yes Yes Yes Yes  Type of Estate agent of Brian Ayers;Living will Healthcare Power of Brian Ayers;Living will  Living will;Healthcare Power of Attorney  Living will Healthcare Power of Brian Ayers;Living will  Does patient want to make changes to medical advance directive?    No - Patient declined  No - Patient declined No - Patient declined  Copy of Healthcare Power of Attorney in Chart?  No - copy requested  No - copy requested   No - copy requested  Would patient like information on creating a medical advance directive?   No - Patient declined        Current Medications (verified) Outpatient Encounter Medications as of 06/15/2023  Medication Sig   albuterol (VENTOLIN HFA) 108 (90 Base) MCG/ACT  inhaler Inhale into the lungs every 6 (six) hours as needed for wheezing or shortness of breath.   aspirin 81 MG chewable tablet Chew 81 mg by mouth daily.   azelastine (ASTELIN) 0.1 % nasal spray USE 1 SPRAY INTO EACH NOSTRIL TWICE A DAY   Biotin 2500 MCG CAPS Take 2,500 mcg by mouth daily.   Blood Glucose Monitoring Suppl (ONETOUCH VERIO FLEX SYSTEM) w/Device KIT USE AS DIRECTED TO CHECK BLOOD SUGARS   clotrimazole-betamethasone (LOTRISONE) cream Apply 1 application topically 2 (two) times daily.   Continuous Blood Gluc Receiver (FREESTYLE LIBRE 3 READER) DEVI 1 Device by Does not apply route continuous. Use to monitor blood sugars   Continuous Glucose Sensor (FREESTYLE LIBRE 3 SENSOR) MISC PLACE 1 SENSOR ON THE SKIN EVERY 14 DAYS. USE TO CHECK GLUCOSE CONTINUOUSLY   dapagliflozin propanediol (FARXIGA) 10 MG TABS tablet Take 1 tablet (10 mg total) by mouth daily before breakfast.   EPINEPHrine 0.3 mg/0.3 mL IJ SOAJ injection as needed.   famotidine (PEPCID) 20 MG tablet TAKE 1 TABLET BY MOUTH TWICE A DAY   fexofenadine (ALLEGRA) 180 MG tablet Take 180 mg by mouth daily.   Fluocinolone Acetonide Body 0.01 % OIL SMARTSIG:sparingly Topical 3 Times Daily PRN   fluticasone-salmeterol (ADVAIR HFA) 115-21 MCG/ACT inhaler Inhale 2 puffs into the lungs 2 (two) times daily.   irbesartan (AVAPRO) 150 MG tablet TAKE 1 TABLET BY MOUTH EVERY DAY  metFORMIN (GLUCOPHAGE-XR) 500 MG 24 hr tablet Take 2 tablets (1,000 mg total) by mouth 2 (two) times daily.   OneTouch Delica Lancets 30G MISC USE TO CHECK BLOOD SUGER TWICE A DAY   ONETOUCH VERIO test strip USE AS INSTRUCTED   OVER THE COUNTER MEDICATION Saw Palmetto 450 mg, twice daily.   rosuvastatin (CRESTOR) 10 MG tablet Take 1 tablet (10 mg total) by mouth daily.   Saw Palmetto 450 MG CAPS Take 450 mg by mouth in the morning and at bedtime.   sildenafil (VIAGRA) 100 MG tablet TAKE 1 TABLET BY MOUTH EVERY DAY AS NEEDED FOR ERECTILE DYSFUNCTION    diphenhydrAMINE (BENADRYL ALLERGY) 25 MG tablet Take 2 tablets (50 mg total) by mouth every 8 (eight) hours as needed for allergies (allergic reaction). (Patient not taking: Reported on 04/04/2023)   fluticasone (FLONASE) 50 MCG/ACT nasal spray SPRAY 1 SPRAY INTO EACH NOSTRIL EVERY DAY (Patient not taking: Reported on 06/15/2023)   OVER THE COUNTER MEDICATION Vitamin D 3 One capsule daily. (Patient not taking: Reported on 06/15/2023)   predniSONE (DELTASONE) 20 MG tablet PRN allergic reaction. Take 3 tabs a day x 1 days; then 2 tabs a day x 1 days; then 1 tabs a day x 1 days, then stop. Take pc. (Patient not taking: Reported on 06/15/2023)   pseudoephedrine (SUDAFED) 30 MG tablet Take 2 tablets (60 mg total) by mouth every 4 (four) hours as needed for congestion (allergic reaction). (Patient not taking: Reported on 04/04/2023)   RSV vaccine recomb adjuvanted (AREXVY) 120 MCG/0.5ML injection Inject into the muscle. (Patient not taking: Reported on 06/15/2023)   UNABLE TO FIND Med Name: Takes allergy shots weekly   No facility-administered encounter medications on file as of 06/15/2023.    Allergies (verified) Symbicort [budesonide-formoterol fumarate] and Simvastatin   History: Past Medical History:  Diagnosis Date   Alcohol abuse    hixtory of   Allergy    Asthma childhood   Cancer (HCC)    skin cancer on face X1   Cataract    Chronic kidney disease    Diabetes mellitus without complication (HCC)    GERD (gastroesophageal reflux disease)    History of kidney stones    Hx of colonic polyps    Hyperlipidemia    Rhinitis    on allery shots   Past Surgical History:  Procedure Laterality Date   CATARACT EXTRACTION Left 2020   COLONOSCOPY  03/04/2019   Dr.Perry   EXTRACORPOREAL SHOCK WAVE LITHOTRIPSY Right 03/14/2018   Procedure: RIGHT EXTRACORPOREAL SHOCK WAVE LITHOTRIPSY (ESWL);  Surgeon: Brian Purpura, MD;  Location: [facility];  Service: Urology;  Laterality: Right;   hernia with  hydrocele  1973   left shoulder pin setting  1978   TONSILLECTOMY  1967   WISDOM TOOTH EXTRACTION  2004   Family History  Problem Relation Age of Onset   Cancer Mother        breast (hx of)   Other Mother        trigeminal neuralgia s/p gamma knife/ CHF   Hypertension Father    Diabetes Neg Hx    Colon cancer Neg Hx    Stomach cancer Neg Hx    Rectal cancer Neg Hx    Social History   Socioeconomic History   Marital status: Married    Spouse name: Not on file   Number of children: 0   Years of education: Not on file   Highest education level: Not on file  Occupational History  Occupation: Theme park manager  Tobacco Use   Smoking status: Former   Smokeless tobacco: Former    Types: Snuff, Chew    Quit date: 12/18/1985   Tobacco comments:    only for a short period in the 1980's  Vaping Use   Vaping Use: Never used  Substance and Sexual Activity   Alcohol use: No    Comment: history of ETOH abuse   Drug use: No   Sexual activity: Never    Partners: Female    Comment: abstinent since 2006  Other Topics Concern   Not on file  Social History Narrative   Engineer, maintenance (IT). Married 1991- No children. Marriage is in good health. 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 Life is good.      Regular exercise: daily/walk at gym   Caffeine use: 2 cups of coffee daily         Social Determinants of Health   Financial Resource Strain: Low Risk  (06/15/2023)   Overall Financial Resource Strain (CARDIA)    Difficulty of Paying Living Expenses: Not hard at all  Food Insecurity: No Food Insecurity (06/15/2023)   Hunger Vital Sign    Worried About Running Out of Food in the Last Year: Never true    Ran Out of Food in the Last Year: Never true  Transportation Needs: No Transportation Needs (06/15/2023)   PRAPARE - Administrator, Civil Service (Medical): No    Lack of Transportation (Non-Medical): No  Physical Activity: Sufficiently Active (06/15/2023)    Exercise Vital Sign    Days of Exercise per Week: 6 days    Minutes of Exercise per Session: 60 min  Stress: No Stress Concern Present (06/15/2023)   [employer] of Occupational Health - Occupational Stress Questionnaire    Feeling of Stress : Not at all  Social Connections: Moderately Integrated (06/15/2023)   Social Connection and Isolation Panel [NHANES]    Frequency of Communication with Friends and Family: More than three times a week    Frequency of Social Gatherings with Friends and Family: More than three times a week    Attends Religious Services: More than 4 times per year    Active Member of [employer] or Organizations: No    Attends Banker Meetings: Never    Marital Status: Married    Tobacco Counseling Counseling given: Not Answered Tobacco comments: only for a short period in the 1980's   Clinical Intake:  Pre-visit preparation completed: Yes  Pain : No/denies pain     Nutritional Status: BMI 25 -29 Overweight Nutritional Risks: None Diabetes: Yes CBG done?: No Did pt. bring in CBG monitor from home?: No  How often do you need to have someone help you when you read instructions, pamphlets, or other written materials from your doctor or pharmacy?: 1 - Never What is the last grade level you completed in school?: 4 year college  Interpreter Needed?: No      Activities of Daily Living    06/15/2023   11:32 AM 06/15/2023   11:31 AM  In your present state of health, do you have any difficulty performing the following activities:  Hearing?  0  Vision?  0  Difficulty concentrating or making decisions?  0  Walking or climbing stairs?  0  Dressing or bathing?  0  Doing errands, shopping?  0  Preparing Food and eating ? N   Using the Toilet? N   In the past six months, have you  accidently leaked urine? N   Do you have problems with loss of bowel control? N   Managing your Medications? N   Managing your Finances? N   Housekeeping or managing  your Housekeeping? N     Patient Care Team: Plotnikov, Georgina Quint, MD as PCP - General (Internal Medicine) Brian Purpura, MD as Consulting Physician (Urology) Shamleffer, Konrad Dolores, MD as Consulting Physician (Endocrinology) Fredrich Birks, OD as Referring Physician (Optometry) Dione Booze, Bertram Millard, MD as Consulting Physician (Ophthalmology) Serena Colonel, MD as Consulting Physician (Otolaryngology) Eileen Stanford, MD as Referring Physician (Allergy and Immunology)  Indicate any recent Medical Services you may have received from other than [facility] providers in the past year (date may be approximate).     Assessment:   This is a routine wellness examination for Alby.  Hearing/Vision screen Vision Screening - Comments:: Annual Eye Exam, patient sees Dr. Dione Booze   Dietary issues and exercise activities discussed: Current Exercise Habits: Structured exercise class, Type of exercise: walking;treadmill;stretching, Time (Minutes): 60, Frequency (Times/Week): 6, Weekly Exercise (Minutes/Week): 360, Intensity: Moderate   Goals Addressed               This Visit's Progress     Patient Stated (pt-stated)        Patient states his goals are to continue to exercise, managing his diabetes, eating right       Depression Screen    06/15/2023   11:30 AM 06/15/2023   11:25 AM 04/04/2023   10:33 AM 06/12/2022    3:24 PM 04/17/2022    4:00 PM 03/29/2022    9:58 AM 10/19/2021    9:43 AM  PHQ 2/9 Scores  PHQ - 2 Score 0 0 0 0 0 0 0  PHQ- 9 Score     0 0 0    Fall Risk    06/15/2023   11:25 AM 06/11/2023   10:58 AM 04/04/2023   10:33 AM 06/12/2022    3:24 PM 04/17/2022    4:00 PM  Fall Risk   Falls in the past year? 0 0 0 0 0  Number falls in past yr: 0 0 0 0 0  Injury with Fall? 0 0 0 0 0  Risk for fall due to : No Fall Risks  No Fall Risks    Follow up Falls evaluation completed  Falls evaluation completed Falls evaluation completed;Education provided     MEDICARE RISK AT HOME:  Medicare  Risk at Home - 06/15/23 1133     Any stairs in or around the home? Yes    If so, are there any without handrails? Yes    Home free of loose throw rugs in walkways, pet beds, electrical cords, etc? Yes    Adequate lighting in your home to reduce risk of falls? Yes    Life alert? Yes    Use of a cane, walker or w/c? No    Grab bars in the bathroom? No    Shower chair or bench in shower? No    Elevated toilet seat or a handicapped toilet? No             TIMED UP AND GO:  Was the test performed?  No    Cognitive Function:        06/15/2023   11:34 AM  6CIT Screen  What Year? 0 points  What month? 0 points  What time? 0 points  Count back from 20 0 points  Months in reverse 0 points  Repeat phrase  0 points  Total Score 0 points    Immunizations Immunization History  Administered Date(s) Administered   Fluad Quad(high Dose 65+) 10/01/2019, 10/06/2020, 10/19/2021, 09/20/2022   Influenza Split 09/28/2011, 10/08/2012   Influenza Whole 10/09/2008, 10/12/2009, 10/10/2010   Influenza, High Dose Seasonal PF 10/14/2018   Influenza,inj,Quad PF,6+ Mos 10/08/2013, 09/09/2014, 10/08/2015, 10/09/2016, 10/08/2017   Moderna Covid-19 Vaccine Bivalent Booster 20yrs & up 09/23/2021   Moderna SARS-COV2 Booster Vaccination 10/31/2020, 04/29/2021   Moderna Sars-Covid-2 Vaccination 01/11/2020, 02/16/2020, 10/31/2020   Pneumococcal Conjugate-13 06/29/2015, 03/28/2021   Pneumococcal Polysaccharide-23 05/26/2014, 07/15/2019   Respiratory Syncytial Virus Vaccine,Recomb Aduvanted(Arexvy) 12/21/2022   Td 07/19/1998, 06/08/2009   Tdap 07/15/2019   Unspecified SARS-COV-2 Vaccination 09/20/2022   Zoster Recombinat (Shingrix) 03/29/2022, 06/29/2022   Zoster, Live 06/09/2014    TDAP status: Up to date  Flu Vaccine status: Up to date  Pneumococcal vaccine status: Up to date  Covid-19 vaccine status: Completed vaccines  Qualifies for Shingles Vaccine? Yes   Zostavax completed Yes    Shingrix Completed?: Yes  Screening Tests Health Maintenance  Topic Date Due   COVID-19 Vaccine (8 - 2023-24 season) 11/15/2022   Diabetic kidney evaluation - Urine ACR  03/29/2023   FOOT EXAM  07/08/2023   HEMOGLOBIN A1C  07/09/2023   INFLUENZA VACCINE  07/19/2023   OPHTHALMOLOGY EXAM  02/29/2024   Diabetic kidney evaluation - eGFR measurement  04/04/2024   Medicare Annual Wellness (AWV)  06/14/2024   Colonoscopy  05/26/2027   DTaP/Tdap/Td (4 - Td or Tdap) 07/14/2029   Pneumonia Vaccine 86+ Years old  Completed   Hepatitis C Screening  Completed   Zoster Vaccines- Shingrix  Completed   HPV VACCINES  Aged Out    Health Maintenance  Health Maintenance Due  Topic Date Due   COVID-19 Vaccine (8 - 2023-24 season) 11/15/2022   Diabetic kidney evaluation - Urine ACR  03/29/2023    Colorectal cancer screening: Type of screening: Colonoscopy. Completed 05/25/2022. Repeat every 5 years  Lung Cancer Screening: (Low Dose CT Chest recommended if Age 74-80 years, 20 pack-year currently smoking OR have quit w/in 15years.) does not qualify.   Lung Cancer Screening Referral: n/a  Additional Screening:  Hepatitis C Screening: does qualify; Completed 10/25/2016  Vision Screening: Recommended annual ophthalmology exams for early detection of glaucoma and other disorders of the eye. Is the patient up to date with their annual eye exam?  Yes  Who is the provider or what is the name of the office in which the patient attends annual eye exams? Dr. Dione Booze If pt is not established with a provider, would they like to be referred to a provider to establish care? No .   Dental Screening: Recommended annual dental exams for proper oral hygiene  Diabetic Foot Exam: Diabetic Foot Exam: Completed 07/07/2022  Community Resource Referral / Chronic Care Management: CRR required this visit?  No   CCM required this visit?  No     Plan:     I have personally reviewed and noted the following in  the patient's chart:   Medical and social history Use of alcohol, tobacco or illicit drugs  Current medications and supplements including opioid prescriptions. Patient is not currently taking opioid prescriptions. Functional ability and status Nutritional status Physical activity Advanced directives List of other physicians Hospitalizations, surgeries, and ER visits in previous 12 months Vitals Screenings to include cognitive, depression, and falls Referrals and appointments  In addition, I have reviewed and discussed with patient certain preventive protocols, quality metrics,  and best practice recommendations. A written personalized care plan for preventive services as well as general preventive health recommendations were provided to patient.     Tamela Oddi, CMA   06/15/2023   After Visit Summary: (MyChart) Due to this being a telephonic visit, the after visit summary with patients personalized plan was offered to patient via MyChart   Nurse Notes:  Mr. Bredehoft , Thank you for taking time to come for your Medicare Wellness Visit. I appreciate your ongoing commitment to your health goals. Please review the following plan we discussed and let me know if I can assist you in the future.   These are the goals we discussed:  Goals       Client will verbalize knowledge of diabetes self-management as evidenced by Hgb A1C <7 or as defined by provider. (pt-stated)      My goal is to get rid of diabetes by watching my diet, sugar & carbohydrate intake.      Patient Stated (pt-stated)      Patient states his goals are to continue to exercise, managing his diabetes, eating right         This is a list of the screening recommended for you and due dates:  Health Maintenance  Topic Date Due   COVID-19 Vaccine (8 - 2023-24 season) 11/15/2022   Yearly kidney health urinalysis for diabetes  03/29/2023   Complete foot exam   07/08/2023   Hemoglobin A1C  07/09/2023   Flu Shot   07/19/2023   Eye exam for diabetics  02/29/2024   Yearly kidney function blood test for diabetes  04/04/2024   Medicare Annual Wellness Visit  06/14/2024   Colon Cancer Screening  05/26/2027   DTaP/Tdap/Td vaccine (4 - Td or Tdap) 07/14/2029   Pneumonia Vaccine  Completed   Hepatitis C Screening  Completed   Zoster (Shingles) Vaccine  Completed   HPV Vaccine  Aged Out

## 2023-06-18 DIAGNOSIS — J301 Allergic rhinitis due to pollen: Secondary | ICD-10-CM | POA: Diagnosis not present

## 2023-06-18 DIAGNOSIS — J3081 Allergic rhinitis due to animal (cat) (dog) hair and dander: Secondary | ICD-10-CM | POA: Diagnosis not present

## 2023-06-18 DIAGNOSIS — J3089 Other allergic rhinitis: Secondary | ICD-10-CM | POA: Diagnosis not present

## 2023-06-25 DIAGNOSIS — J3081 Allergic rhinitis due to animal (cat) (dog) hair and dander: Secondary | ICD-10-CM | POA: Diagnosis not present

## 2023-06-25 DIAGNOSIS — J3089 Other allergic rhinitis: Secondary | ICD-10-CM | POA: Diagnosis not present

## 2023-06-25 DIAGNOSIS — J301 Allergic rhinitis due to pollen: Secondary | ICD-10-CM | POA: Diagnosis not present

## 2023-07-02 DIAGNOSIS — J3089 Other allergic rhinitis: Secondary | ICD-10-CM | POA: Diagnosis not present

## 2023-07-02 DIAGNOSIS — J301 Allergic rhinitis due to pollen: Secondary | ICD-10-CM | POA: Diagnosis not present

## 2023-07-02 DIAGNOSIS — J3081 Allergic rhinitis due to animal (cat) (dog) hair and dander: Secondary | ICD-10-CM | POA: Diagnosis not present

## 2023-07-06 DIAGNOSIS — C44319 Basal cell carcinoma of skin of other parts of face: Secondary | ICD-10-CM | POA: Diagnosis not present

## 2023-07-06 DIAGNOSIS — L570 Actinic keratosis: Secondary | ICD-10-CM | POA: Diagnosis not present

## 2023-07-06 DIAGNOSIS — D485 Neoplasm of uncertain behavior of skin: Secondary | ICD-10-CM | POA: Diagnosis not present

## 2023-07-06 DIAGNOSIS — L578 Other skin changes due to chronic exposure to nonionizing radiation: Secondary | ICD-10-CM | POA: Diagnosis not present

## 2023-07-09 DIAGNOSIS — J3081 Allergic rhinitis due to animal (cat) (dog) hair and dander: Secondary | ICD-10-CM | POA: Diagnosis not present

## 2023-07-09 DIAGNOSIS — J3089 Other allergic rhinitis: Secondary | ICD-10-CM | POA: Diagnosis not present

## 2023-07-09 DIAGNOSIS — J301 Allergic rhinitis due to pollen: Secondary | ICD-10-CM | POA: Diagnosis not present

## 2023-07-11 DIAGNOSIS — H31012 Macula scars of posterior pole (postinflammatory) (post-traumatic), left eye: Secondary | ICD-10-CM | POA: Diagnosis not present

## 2023-07-11 DIAGNOSIS — H35412 Lattice degeneration of retina, left eye: Secondary | ICD-10-CM | POA: Diagnosis not present

## 2023-07-11 DIAGNOSIS — H2511 Age-related nuclear cataract, right eye: Secondary | ICD-10-CM | POA: Diagnosis not present

## 2023-07-11 DIAGNOSIS — E119 Type 2 diabetes mellitus without complications: Secondary | ICD-10-CM | POA: Diagnosis not present

## 2023-07-11 DIAGNOSIS — H43812 Vitreous degeneration, left eye: Secondary | ICD-10-CM | POA: Diagnosis not present

## 2023-07-11 DIAGNOSIS — Z961 Presence of intraocular lens: Secondary | ICD-10-CM | POA: Diagnosis not present

## 2023-07-11 LAB — HM DIABETES EYE EXAM

## 2023-07-12 ENCOUNTER — Encounter: Payer: Self-pay | Admitting: Internal Medicine

## 2023-07-12 ENCOUNTER — Ambulatory Visit (INDEPENDENT_AMBULATORY_CARE_PROVIDER_SITE_OTHER): Payer: Medicare Other | Admitting: Internal Medicine

## 2023-07-12 VITALS — BP 126/82 | HR 85 | Ht 70.0 in | Wt 201.0 lb

## 2023-07-12 DIAGNOSIS — E119 Type 2 diabetes mellitus without complications: Secondary | ICD-10-CM | POA: Diagnosis not present

## 2023-07-12 DIAGNOSIS — Z7984 Long term (current) use of oral hypoglycemic drugs: Secondary | ICD-10-CM

## 2023-07-12 LAB — POCT GLYCOSYLATED HEMOGLOBIN (HGB A1C): Hemoglobin A1C: 6.8 % — AB (ref 4.0–5.6)

## 2023-07-12 NOTE — Progress Notes (Signed)
Name: Brian Ayers  Age/ Sex: 72 y.o., male   MRN/ DOB: [id number], Aug 19, 1951     PCP: Tresa Garter, MD   Reason for Endocrinology Evaluation: Type 2 Diabetes Mellitus  Initial Endocrine Consultative Visit: 02/07/2021    PATIENT IDENTIFIER: [name] is a 72 y.o. male with a past medical history of  T2DM, Asthma and Dyslipidemia, with Hx of alcohol abuse. The patient has followed with Endocrinology clinic since 02/07/2021 for consultative assistance with management of his diabetes.  DIABETIC HISTORY:  Brian Ayers was diagnosed with DM in 2015. He has been on Metformin, repaglinide started 2/20222  His hemoglobin A1c has ranged from 6.7% in 2019, peaking at 7.6% in 2022.   On his intial visit he had an A1c of 7.6 %. He was just started on Repaglinide so we continued this until 04/2021 due to recurrent hypoglycemia. We increased Metformin    Restarted Repaglinide in 06/2021 due to hyperglycemia with Bg's in 200's. But this was discontinued in 08/2021 and started on Farxiga  with an A1c 6.3 %   SUBJECTIVE:   During the last visit (01/08/2023): A1c 7.0 %     Today (07/12/2023): Brian Ayers is here for a follow up on diabetes management.  He checks his blood sugars multiple  times daily, through CGM. The patient has not had hypoglycemic episodes .  Weight has been stable  Denies nausea, vomiting Denies constipation  or diarrhea    HOME DIABETES REGIMEN:  Metformin 500 mg 2 tabs BID  Farxiga 10 mg daily     Statin: yes ACE-I/ARB: yes     CONTINUOUS GLUCOSE MONITORING RECORD INTERPRETATION    Dates of Recording: 7/12-7/25/2024  Sensor description:freestyle libre  Results statistics:   CGM use % of time 96  Average and SD 134/21  Time in range  93%  % Time Above 180 7  % Time above 250 0  % Time Below target 0   Glycemic patterns summary: Optimal BG's through the night and most of the day  Hyperglycemic episodes at postprandial   Hypoglycemic  episodes occurred n/a  Overnight periods: optimal      DIABETIC COMPLICATIONS: Microvascular complications:  Posterior vitreous detachment but no DR Denies: CKD, neuropathy  Last Eye Exam: Completed 07/11/2023  Macrovascular complications:   Denies: CAD, CVA, PVD   HISTORY:  Past Medical History:  Past Medical History:  Diagnosis Date   Alcohol abuse    hixtory of   Allergy    Asthma childhood   Cancer (HCC)    skin cancer on face X1   Cataract    Chronic kidney disease    Diabetes mellitus without complication (HCC)    GERD (gastroesophageal reflux disease)    History of kidney stones    Hx of colonic polyps    Hyperlipidemia    Rhinitis    on allery shots   Past Surgical History:  Past Surgical History:  Procedure Laterality Date   CATARACT EXTRACTION Left 2020   COLONOSCOPY  03/04/2019   Dr.Perry   EXTRACORPOREAL SHOCK WAVE LITHOTRIPSY Right 03/14/2018   Procedure: RIGHT EXTRACORPOREAL SHOCK WAVE LITHOTRIPSY (ESWL);  Surgeon: Heloise Purpura, MD;  Location: [facility];  Service: Urology;  Laterality: Right;   hernia with hydrocele  1973   left shoulder pin setting  1978   TONSILLECTOMY  1967   WISDOM TOOTH EXTRACTION  2004   Social History:  reports that he has quit smoking. He quit smokeless tobacco use about 37 years  ago.  His smokeless tobacco use included snuff and chew. He reports that he does not drink alcohol and does not use drugs. Family History:  Family History  Problem Relation Age of Onset   Cancer Mother        breast (hx of)   Other Mother        trigeminal neuralgia s/p gamma knife/ CHF   Hypertension Father    Diabetes Neg Hx    Colon cancer Neg Hx    Stomach cancer Neg Hx    Rectal cancer Neg Hx      HOME MEDICATIONS: Allergies as of 07/12/2023       Reactions   Symbicort [budesonide-formoterol Fumarate] Anaphylaxis   Anaphylactic reaction to Symbicort vs other (???) after 10 days of use. Went to ER w/rash/SOB/swollen lips.Using  Advair ok   Simvastatin    GERD        Medication List        Accurate as of July 12, 2023 10:07 AM. If you have any questions, ask your nurse or doctor.          STOP taking these medications    predniSONE 20 MG tablet Commonly known as: DELTASONE Stopped by: Johnney Ou Wojciech Willetts       TAKE these medications    Arexvy 120 MCG/0.5ML injection Generic drug: RSV vaccine recomb adjuvanted Inject into the muscle.   aspirin 81 MG chewable tablet Chew 81 mg by mouth daily.   azelastine 0.1 % nasal spray Commonly known as: ASTELIN USE 1 SPRAY INTO EACH NOSTRIL TWICE A DAY   Biotin 2500 MCG Caps Take 2,500 mcg by mouth daily.   clotrimazole-betamethasone cream Commonly known as: LOTRISONE Apply 1 application topically 2 (two) times daily.   dapagliflozin propanediol 10 MG Tabs tablet Commonly known as: Farxiga Take 1 tablet (10 mg total) by mouth daily before breakfast.   diphenhydrAMINE 25 MG tablet Commonly known as: Benadryl Allergy Take 2 tablets (50 mg total) by mouth every 8 (eight) hours as needed for allergies (allergic reaction).   EPINEPHrine 0.3 mg/0.3 mL Soaj injection Commonly known as: EPI-PEN as needed.   famotidine 20 MG tablet Commonly known as: PEPCID TAKE 1 TABLET BY MOUTH TWICE A DAY   fexofenadine 180 MG tablet Commonly known as: ALLEGRA Take 180 mg by mouth daily.   Fluocinolone Acetonide Body 0.01 % Oil SMARTSIG:sparingly Topical 3 Times Daily PRN   fluticasone 50 MCG/ACT nasal spray Commonly known as: FLONASE   fluticasone-salmeterol 115-21 MCG/ACT inhaler Commonly known as: ADVAIR HFA Inhale 2 puffs into the lungs 2 (two) times daily.   FreeStyle Libre 3 Reader Devi 1 Device by Does not apply route continuous. Use to monitor blood sugars   FreeStyle Libre 3 Sensor Misc PLACE 1 SENSOR ON THE SKIN EVERY 14 DAYS. USE TO CHECK GLUCOSE CONTINUOUSLY   irbesartan 150 MG tablet Commonly known as: AVAPRO TAKE 1 TABLET BY  MOUTH EVERY DAY   metFORMIN 500 MG 24 hr tablet Commonly known as: GLUCOPHAGE-XR Take 2 tablets (1,000 mg total) by mouth 2 (two) times daily.   OneTouch Delica Lancets 30G Misc USE TO CHECK BLOOD SUGER TWICE A DAY   OneTouch Verio Flex System w/Device Kit USE AS DIRECTED TO CHECK BLOOD SUGARS   OneTouch Verio test strip Generic drug: glucose blood USE AS INSTRUCTED   OVER THE COUNTER MEDICATION Saw Palmetto 450 mg, twice daily.   OVER THE COUNTER MEDICATION Vitamin D 3 One capsule daily.   pseudoephedrine 30 MG  tablet Commonly known as: Sudafed Take 2 tablets (60 mg total) by mouth every 4 (four) hours as needed for congestion (allergic reaction).   rosuvastatin 10 MG tablet Commonly known as: CRESTOR Take 1 tablet (10 mg total) by mouth daily.   Saw Palmetto 450 MG Caps Take 450 mg by mouth in the morning and at bedtime.   sildenafil 100 MG tablet Commonly known as: VIAGRA TAKE 1 TABLET BY MOUTH EVERY DAY AS NEEDED FOR ERECTILE DYSFUNCTION   UNABLE TO FIND Med Name: Takes allergy shots weekly   Ventolin HFA 108 (90 Base) MCG/ACT inhaler Generic drug: albuterol Inhale into the lungs every 6 (six) hours as needed for wheezing or shortness of breath.         OBJECTIVE:   Vital Signs: BP 126/82 (BP Location: Left Arm, Patient Position: Sitting, Cuff Size: Small)   Pulse 85   Ht 5\' 10"  (1.778 m)   Wt 201 lb (91.2 kg)   SpO2 95%   BMI 28.84 kg/m   Wt Readings from Last 3 Encounters:  07/12/23 201 lb (91.2 kg)  06/15/23 199 lb (90.3 kg)  04/04/23 204 lb (92.5 kg)     Exam: General: Pt appears well and is in NAD  Lungs: Clear with good BS bilat   Heart: RRR    Abdomen: Soft, nontender  Extremities: No pretibial edema.   Neuro: MS is good with appropriate affect, pt is alert and Ox3    DM foot exam:   07/12/2023  The skin of the feet is without sores or ulcerations, patient with bilateral plantar callus formation The pedal pulses are 2+ on right  and 2+ on left. The sensation is intact to a screening 5.07, 10 gram monofilament bilaterally    DATA REVIEWED:  Lab Results  Component Value Date   HGBA1C 6.8 (A) 07/12/2023   HGBA1C 7.0 (A) 01/08/2023   HGBA1C 6.9 (A) 07/07/2022    Latest Reference Range & Units 04/05/23 07:57  Sodium 135 - 145 mEq/L 137  Potassium 3.5 - 5.1 mEq/L 4.8  Chloride 96 - 112 mEq/L 105  CO2 19 - 32 mEq/L 24  Glucose 70 - 99 mg/dL 956 (H)  BUN 6 - 23 mg/dL 23  Creatinine 2.13 - 0.86 mg/dL 5.78  Calcium 8.4 - 46.9 mg/dL 9.2  Alkaline Phosphatase 39 - 117 U/L 61  Albumin 3.5 - 5.2 g/dL 4.5  AST 0 - 37 U/L 21  ALT 0 - 53 U/L 25  Total Protein 6.0 - 8.3 g/dL 6.7  Total Bilirubin 0.2 - 1.2 mg/dL 0.8  GFR >62.95 mL/min 78.49  Total CHOL/HDL Ratio  2  Cholesterol 0 - 200 mg/dL 284  HDL Cholesterol >13.24 mg/dL 40.10  LDL (calc) 0 - 99 mg/dL 73  NonHDL  27.25  Triglycerides 0.0 - 149.0 mg/dL 36.6  VLDL 0.0 - 44.0 mg/dL 34.7  (H): Data is abnormally high ASSESSMENT / PLAN / RECOMMENDATIONS:   1) Type 2 Diabetes Mellitus, Optimally controlled, Without complications - Most recent A1c of 6.8%. Goal A1c < 7.0 %.     -A1c at goal  -Labs to his PCPs office  from 04/05/2023 were reviewed today  -No changes  MEDICATIONS: Continue  Metformin 500 mg, Two tablets with Breakfast and two tablets with Supper Continue  Farxiga 10 mg , 1 tablet daily    EDUCATION / INSTRUCTIONS: BG monitoring instructions: Patient is instructed to check his blood sugars 3 times a day, before meals . Call [facility] if: BG  persistently < 70  I reviewed the Rule of 15 for the treatment of hypoglycemia in detail with the patient. Literature supplied.    2) Diabetic complications:  Eye: Does not have known diabetic retinopathy.  Neuro/ Feet: Does not have known diabetic peripheral neuropathy .  Renal: Patient does not have known baseline CKD. He   is  on an ACEI/ARB at present.      F/U in 6 months     Signed electronically by: Lyndle Herrlich, MD  [facility]  [facility] [address] [place], [place] [address] Phone: [phone] FAX: [phone]   CC: Tresa Garter, MD [address] [place] [address] Phone: [phone]  Fax: [phone]  Return to Endocrinology clinic as below: Future Appointments  Date Time Provider Department Center  10/04/2023 10:20 AM Plotnikov, Georgina Quint, MD [facility] None

## 2023-07-12 NOTE — Patient Instructions (Signed)
-   Continue  Metformin 500 mg, Two tablets with Breakfast and two tablets with Supper - Continue Farxiga 10  mg, 1 tablet daily     HOW TO TREAT LOW BLOOD SUGARS (Blood sugar LESS THAN 70 MG/DL) Please follow the RULE OF 15 for the treatment of hypoglycemia treatment (when your (blood sugars are less than 70 mg/dL)   STEP 1: Take 15 grams of carbohydrates when your blood sugar is low, which includes:  3-4 GLUCOSE TABS  OR 3-4 OZ OF JUICE OR REGULAR SODA OR ONE TUBE OF GLUCOSE GEL    STEP 2: RECHECK blood sugar in 15 MINUTES STEP 3: If your blood sugar is still low at the 15 minute recheck --> then, go back to STEP 1 and treat AGAIN with another 15 grams of carbohydrates.

## 2023-07-17 DIAGNOSIS — J301 Allergic rhinitis due to pollen: Secondary | ICD-10-CM | POA: Diagnosis not present

## 2023-07-17 DIAGNOSIS — J3081 Allergic rhinitis due to animal (cat) (dog) hair and dander: Secondary | ICD-10-CM | POA: Diagnosis not present

## 2023-07-17 DIAGNOSIS — J3089 Other allergic rhinitis: Secondary | ICD-10-CM | POA: Diagnosis not present

## 2023-07-19 ENCOUNTER — Encounter: Payer: Self-pay | Admitting: Internal Medicine

## 2023-07-23 DIAGNOSIS — J3081 Allergic rhinitis due to animal (cat) (dog) hair and dander: Secondary | ICD-10-CM | POA: Diagnosis not present

## 2023-07-23 DIAGNOSIS — J3089 Other allergic rhinitis: Secondary | ICD-10-CM | POA: Diagnosis not present

## 2023-07-23 DIAGNOSIS — J301 Allergic rhinitis due to pollen: Secondary | ICD-10-CM | POA: Diagnosis not present

## 2023-07-26 DIAGNOSIS — J301 Allergic rhinitis due to pollen: Secondary | ICD-10-CM | POA: Diagnosis not present

## 2023-07-26 DIAGNOSIS — J3081 Allergic rhinitis due to animal (cat) (dog) hair and dander: Secondary | ICD-10-CM | POA: Diagnosis not present

## 2023-07-26 DIAGNOSIS — J3089 Other allergic rhinitis: Secondary | ICD-10-CM | POA: Diagnosis not present

## 2023-07-30 DIAGNOSIS — J3089 Other allergic rhinitis: Secondary | ICD-10-CM | POA: Diagnosis not present

## 2023-07-30 DIAGNOSIS — J301 Allergic rhinitis due to pollen: Secondary | ICD-10-CM | POA: Diagnosis not present

## 2023-07-30 DIAGNOSIS — J3081 Allergic rhinitis due to animal (cat) (dog) hair and dander: Secondary | ICD-10-CM | POA: Diagnosis not present

## 2023-08-06 DIAGNOSIS — J301 Allergic rhinitis due to pollen: Secondary | ICD-10-CM | POA: Diagnosis not present

## 2023-08-06 DIAGNOSIS — J3089 Other allergic rhinitis: Secondary | ICD-10-CM | POA: Diagnosis not present

## 2023-08-06 DIAGNOSIS — J3081 Allergic rhinitis due to animal (cat) (dog) hair and dander: Secondary | ICD-10-CM | POA: Diagnosis not present

## 2023-08-13 DIAGNOSIS — J3081 Allergic rhinitis due to animal (cat) (dog) hair and dander: Secondary | ICD-10-CM | POA: Diagnosis not present

## 2023-08-13 DIAGNOSIS — J301 Allergic rhinitis due to pollen: Secondary | ICD-10-CM | POA: Diagnosis not present

## 2023-08-13 DIAGNOSIS — J3089 Other allergic rhinitis: Secondary | ICD-10-CM | POA: Diagnosis not present

## 2023-08-14 DIAGNOSIS — C44319 Basal cell carcinoma of skin of other parts of face: Secondary | ICD-10-CM | POA: Diagnosis not present

## 2023-08-21 DIAGNOSIS — J3081 Allergic rhinitis due to animal (cat) (dog) hair and dander: Secondary | ICD-10-CM | POA: Diagnosis not present

## 2023-08-21 DIAGNOSIS — J301 Allergic rhinitis due to pollen: Secondary | ICD-10-CM | POA: Diagnosis not present

## 2023-08-21 DIAGNOSIS — J3089 Other allergic rhinitis: Secondary | ICD-10-CM | POA: Diagnosis not present

## 2023-08-27 DIAGNOSIS — J3089 Other allergic rhinitis: Secondary | ICD-10-CM | POA: Diagnosis not present

## 2023-08-27 DIAGNOSIS — J301 Allergic rhinitis due to pollen: Secondary | ICD-10-CM | POA: Diagnosis not present

## 2023-08-27 DIAGNOSIS — J3081 Allergic rhinitis due to animal (cat) (dog) hair and dander: Secondary | ICD-10-CM | POA: Diagnosis not present

## 2023-08-28 ENCOUNTER — Encounter: Payer: Self-pay | Admitting: Internal Medicine

## 2023-08-29 ENCOUNTER — Ambulatory Visit: Payer: Medicare Other | Admitting: Family Medicine

## 2023-08-30 ENCOUNTER — Other Ambulatory Visit: Payer: Self-pay | Admitting: Internal Medicine

## 2023-08-30 MED ORDER — CLOTRIMAZOLE-BETAMETHASONE 1-0.05 % EX CREA: 1.0000 | TOPICAL_CREAM | Freq: Two times a day (BID) | CUTANEOUS | 1 refills | Status: DC

## 2023-09-03 DIAGNOSIS — J3089 Other allergic rhinitis: Secondary | ICD-10-CM | POA: Diagnosis not present

## 2023-09-03 DIAGNOSIS — J301 Allergic rhinitis due to pollen: Secondary | ICD-10-CM | POA: Diagnosis not present

## 2023-09-03 DIAGNOSIS — J3081 Allergic rhinitis due to animal (cat) (dog) hair and dander: Secondary | ICD-10-CM | POA: Diagnosis not present

## 2023-09-08 ENCOUNTER — Other Ambulatory Visit: Payer: Self-pay | Admitting: Internal Medicine

## 2023-09-10 DIAGNOSIS — J3089 Other allergic rhinitis: Secondary | ICD-10-CM | POA: Diagnosis not present

## 2023-09-10 DIAGNOSIS — J3081 Allergic rhinitis due to animal (cat) (dog) hair and dander: Secondary | ICD-10-CM | POA: Diagnosis not present

## 2023-09-10 DIAGNOSIS — J301 Allergic rhinitis due to pollen: Secondary | ICD-10-CM | POA: Diagnosis not present

## 2023-09-13 DIAGNOSIS — Z8589 Personal history of malignant neoplasm of other organs and systems: Secondary | ICD-10-CM | POA: Diagnosis not present

## 2023-09-13 DIAGNOSIS — L814 Other melanin hyperpigmentation: Secondary | ICD-10-CM | POA: Diagnosis not present

## 2023-09-13 DIAGNOSIS — D2272 Melanocytic nevi of left lower limb, including hip: Secondary | ICD-10-CM | POA: Diagnosis not present

## 2023-09-13 DIAGNOSIS — L578 Other skin changes due to chronic exposure to nonionizing radiation: Secondary | ICD-10-CM | POA: Diagnosis not present

## 2023-09-13 DIAGNOSIS — L570 Actinic keratosis: Secondary | ICD-10-CM | POA: Diagnosis not present

## 2023-09-13 DIAGNOSIS — D225 Melanocytic nevi of trunk: Secondary | ICD-10-CM | POA: Diagnosis not present

## 2023-09-17 DIAGNOSIS — J3081 Allergic rhinitis due to animal (cat) (dog) hair and dander: Secondary | ICD-10-CM | POA: Diagnosis not present

## 2023-09-17 DIAGNOSIS — J301 Allergic rhinitis due to pollen: Secondary | ICD-10-CM | POA: Diagnosis not present

## 2023-09-17 DIAGNOSIS — J3089 Other allergic rhinitis: Secondary | ICD-10-CM | POA: Diagnosis not present

## 2023-09-24 DIAGNOSIS — J3089 Other allergic rhinitis: Secondary | ICD-10-CM | POA: Diagnosis not present

## 2023-09-24 DIAGNOSIS — J301 Allergic rhinitis due to pollen: Secondary | ICD-10-CM | POA: Diagnosis not present

## 2023-09-24 DIAGNOSIS — J3081 Allergic rhinitis due to animal (cat) (dog) hair and dander: Secondary | ICD-10-CM | POA: Diagnosis not present

## 2023-09-28 ENCOUNTER — Other Ambulatory Visit: Payer: Medicare Other

## 2023-09-28 ENCOUNTER — Encounter: Payer: Self-pay | Admitting: Internal Medicine

## 2023-10-01 ENCOUNTER — Ambulatory Visit (INDEPENDENT_AMBULATORY_CARE_PROVIDER_SITE_OTHER): Payer: Medicare Other

## 2023-10-01 DIAGNOSIS — Z23 Encounter for immunization: Secondary | ICD-10-CM

## 2023-10-01 NOTE — Progress Notes (Cosign Needed Addendum)
After obtaining consent, and per orders of Dr. Posey Rea, injection of HDF given by Ferdie Ping. Patient instructed to report any adverse reaction to me immediately.  Medical screening examination/treatment/procedure(s) were performed by non-physician practitioner and as supervising physician I was immediately available for consultation/collaboration.  I agree with above. Jacinta Shoe, MD

## 2023-10-02 DIAGNOSIS — J3089 Other allergic rhinitis: Secondary | ICD-10-CM | POA: Diagnosis not present

## 2023-10-02 DIAGNOSIS — J3081 Allergic rhinitis due to animal (cat) (dog) hair and dander: Secondary | ICD-10-CM | POA: Diagnosis not present

## 2023-10-02 DIAGNOSIS — J301 Allergic rhinitis due to pollen: Secondary | ICD-10-CM | POA: Diagnosis not present

## 2023-10-03 ENCOUNTER — Other Ambulatory Visit (INDEPENDENT_AMBULATORY_CARE_PROVIDER_SITE_OTHER): Payer: Medicare Other

## 2023-10-03 ENCOUNTER — Other Ambulatory Visit: Payer: Self-pay | Admitting: Internal Medicine

## 2023-10-03 DIAGNOSIS — N320 Bladder-neck obstruction: Secondary | ICD-10-CM

## 2023-10-03 DIAGNOSIS — E1165 Type 2 diabetes mellitus with hyperglycemia: Secondary | ICD-10-CM

## 2023-10-03 DIAGNOSIS — E785 Hyperlipidemia, unspecified: Secondary | ICD-10-CM

## 2023-10-03 LAB — COMPREHENSIVE METABOLIC PANEL
ALT: 22 U/L (ref 0–53)
AST: 18 U/L (ref 0–37)
Albumin: 4.1 g/dL (ref 3.5–5.2)
Alkaline Phosphatase: 59 U/L (ref 39–117)
BUN: 20 mg/dL (ref 6–23)
CO2: 25 meq/L (ref 19–32)
Calcium: 9.1 mg/dL (ref 8.4–10.5)
Chloride: 105 meq/L (ref 96–112)
Creatinine, Ser: 0.91 mg/dL (ref 0.40–1.50)
GFR: 85.49 mL/min (ref >60.00)
Glucose, Bld: 145 mg/dL — ABNORMAL HIGH (ref 70–99)
Potassium: 4.5 meq/L (ref 3.5–5.1)
Sodium: 137 meq/L (ref 135–145)
Total Bilirubin: 0.9 mg/dL (ref 0.2–1.2)
Total Protein: 6 g/dL (ref 6.0–8.3)

## 2023-10-03 LAB — LIPID PANEL
Cholesterol: 154 mg/dL (ref 0–200)
HDL: 58.3 mg/dL (ref >39.00)
LDL Cholesterol: 80 mg/dL (ref 0–99)
NonHDL: 95.71
Total CHOL/HDL Ratio: 3
Triglycerides: 80 mg/dL (ref 0.0–149.0)
VLDL: 16 mg/dL (ref 0.0–40.0)

## 2023-10-03 LAB — CBC WITH DIFFERENTIAL/PLATELET
Basophils Absolute: 0 10*3/uL (ref 0.0–0.1)
Basophils Relative: 0.8 % (ref 0.0–3.0)
Eosinophils Absolute: 0.4 10*3/uL (ref 0.0–0.7)
Eosinophils Relative: 6.3 % — ABNORMAL HIGH (ref 0.0–5.0)
HCT: 43.4 % (ref 39.0–52.0)
Hemoglobin: 14.3 g/dL (ref 13.0–17.0)
Lymphocytes Relative: 37.2 % (ref 12.0–46.0)
Lymphs Abs: 2.2 10*3/uL (ref 0.7–4.0)
MCHC: 32.9 g/dL (ref 30.0–36.0)
MCV: 94.1 fL (ref 78.0–100.0)
Monocytes Absolute: 0.6 10*3/uL (ref 0.1–1.0)
Monocytes Relative: 10.1 % (ref 3.0–12.0)
Neutro Abs: 2.7 10*3/uL (ref 1.4–7.7)
Neutrophils Relative %: 45.6 % (ref 43.0–77.0)
Platelets: 253 10*3/uL (ref 150.0–400.0)
RBC: 4.62 Mil/uL (ref 4.22–5.81)
RDW: 12.8 % (ref 11.5–15.5)
WBC: 6 10*3/uL (ref 4.0–10.5)

## 2023-10-03 LAB — URINALYSIS
Bilirubin Urine: NEGATIVE
Ketones, ur: NEGATIVE
Leukocytes,Ua: NEGATIVE
Nitrite: NEGATIVE
Specific Gravity, Urine: 1.02 (ref 1.000–1.030)
Total Protein, Urine: NEGATIVE
Urine Glucose: >=1000 — AB
Urobilinogen, UA: 0.2 (ref 0.0–1.0)
pH: 6 (ref 5.0–8.0)

## 2023-10-03 LAB — TSH: TSH: 3.28 u[IU]/mL (ref 0.35–5.50)

## 2023-10-03 LAB — MICROALBUMIN / CREATININE URINE RATIO
Creatinine,U: 73.6 mg/dL
Microalb Creat Ratio: 6.3 mg/g (ref 0.0–30.0)
Microalb, Ur: 4.7 mg/dL — ABNORMAL HIGH (ref 0.0–1.9)

## 2023-10-03 LAB — HEMOGLOBIN A1C: Hgb A1c MFr Bld: 7.4 % — ABNORMAL HIGH (ref 4.6–6.5)

## 2023-10-03 LAB — PSA: PSA: 0.77 ng/mL (ref 0.10–4.00)

## 2023-10-04 ENCOUNTER — Ambulatory Visit: Payer: Medicare Other | Admitting: Internal Medicine

## 2023-10-04 ENCOUNTER — Encounter: Payer: Self-pay | Admitting: Internal Medicine

## 2023-10-04 VITALS — BP 120/78 | HR 80 | Temp 98.3°F | Ht 70.0 in | Wt 200.0 lb

## 2023-10-04 DIAGNOSIS — E1165 Type 2 diabetes mellitus with hyperglycemia: Secondary | ICD-10-CM

## 2023-10-04 DIAGNOSIS — E119 Type 2 diabetes mellitus without complications: Secondary | ICD-10-CM

## 2023-10-04 DIAGNOSIS — I10 Essential (primary) hypertension: Secondary | ICD-10-CM

## 2023-10-04 DIAGNOSIS — E785 Hyperlipidemia, unspecified: Secondary | ICD-10-CM

## 2023-10-04 DIAGNOSIS — Z7984 Long term (current) use of oral hypoglycemic drugs: Secondary | ICD-10-CM | POA: Diagnosis not present

## 2023-10-04 NOTE — Assessment & Plan Note (Signed)
On Libre 3

## 2023-10-04 NOTE — Assessment & Plan Note (Addendum)
Worse - improve diet F/u w/Dr Shamleffer On Metformin, Farxiga 10 mg/d On Libre 3

## 2023-10-04 NOTE — Assessment & Plan Note (Signed)
Continue on Crestor 10 mg

## 2023-10-04 NOTE — Progress Notes (Signed)
Subjective:  Patient ID: Brian Ayers, male    DOB: 10/14/1951  Age: 72 y.o. MRN: [id number]  CC: Follow-up (6 MNTH F/U)   HPI Brian Ayers presents for DM, asthma, allergies On Libre 3  Outpatient Medications Prior to Visit  Medication Sig Dispense Refill   albuterol (VENTOLIN HFA) 108 (90 Base) MCG/ACT inhaler Inhale into the lungs every 6 (six) hours as needed for wheezing or shortness of breath.     aspirin 81 MG chewable tablet Chew 81 mg by mouth daily.     azelastine (ASTELIN) 0.1 % nasal spray USE 1 SPRAY INTO EACH NOSTRIL TWICE A DAY     Biotin 2500 MCG CAPS Take 2,500 mcg by mouth daily.     Blood Glucose Monitoring Suppl (ONETOUCH VERIO FLEX SYSTEM) w/Device KIT USE AS DIRECTED TO CHECK BLOOD SUGARS 1 kit 0   clotrimazole-betamethasone (LOTRISONE) cream Apply 1 Application topically 2 (two) times daily. 45 g 1   Continuous Blood Gluc Receiver (FREESTYLE LIBRE 3 READER) DEVI 1 Device by Does not apply route continuous. Use to monitor blood sugars 1 each 0   Continuous Glucose Sensor (FREESTYLE LIBRE 3 SENSOR) MISC PLACE 1 SENSOR ON THE SKIN EVERY 14 DAYS. USE TO CHECK GLUCOSE CONTINUOUSLY 6 each 3   dapagliflozin propanediol (FARXIGA) 10 MG TABS tablet Take 1 tablet (10 mg total) by mouth daily before breakfast. 90 tablet 3   diphenhydrAMINE (BENADRYL ALLERGY) 25 MG tablet Take 2 tablets (50 mg total) by mouth every 8 (eight) hours as needed for allergies (allergic reaction). 60 tablet 0   EPINEPHrine 0.3 mg/0.3 mL IJ SOAJ injection as needed.  0   famotidine (PEPCID) 20 MG tablet TAKE 1 TABLET BY MOUTH TWICE A DAY 180 tablet 3   fexofenadine (ALLEGRA) 180 MG tablet Take 180 mg by mouth daily.     Fluocinolone Acetonide Body 0.01 % OIL SMARTSIG:sparingly Topical 3 Times Daily PRN     fluticasone (FLONASE) 50 MCG/ACT nasal spray      fluticasone-salmeterol (ADVAIR HFA) 115-21 MCG/ACT inhaler Inhale 2 puffs into the lungs 2 (two) times daily.     irbesartan (AVAPRO) 150 MG  tablet TAKE 1 TABLET BY MOUTH EVERY DAY 90 tablet 3   metFORMIN (GLUCOPHAGE-XR) 500 MG 24 hr tablet TAKE 2 TABLETS BY MOUTH TWICE A DAY 360 tablet 1   OneTouch Delica Lancets 30G MISC USE TO CHECK BLOOD SUGER TWICE A DAY 100 each 5   ONETOUCH VERIO test strip USE AS INSTRUCTED 100 strip 5   OVER THE COUNTER MEDICATION Saw Palmetto 450 mg, twice daily.     OVER THE COUNTER MEDICATION Vitamin D 3 One capsule daily.     rosuvastatin (CRESTOR) 10 MG tablet Take 1 tablet (10 mg total) by mouth daily. 90 tablet 3   RSV vaccine recomb adjuvanted (AREXVY) 120 MCG/0.5ML injection Inject into the muscle. 0.5 mL 0   Saw Palmetto 450 MG CAPS Take 450 mg by mouth in the morning and at bedtime.     sildenafil (VIAGRA) 100 MG tablet TAKE 1 TABLET BY MOUTH EVERY DAY AS NEEDED FOR ERECTILE DYSFUNCTION 12 tablet 11   UNABLE TO FIND Med Name: Takes allergy shots weekly     pseudoephedrine (SUDAFED) 30 MG tablet Take 2 tablets (60 mg total) by mouth every 4 (four) hours as needed for congestion (allergic reaction). (Patient not taking: Reported on 07/12/2023) 60 tablet 1   No facility-administered medications prior to visit.    ROS: Review of Systems  Constitutional:  Negative for appetite change, fatigue and unexpected weight change.  HENT:  Negative for congestion, nosebleeds, sneezing, sore throat and trouble swallowing.   Eyes:  Negative for itching and visual disturbance.  Respiratory:  Negative for cough.   Cardiovascular:  Negative for chest pain, palpitations and leg swelling.  Gastrointestinal:  Negative for abdominal distention, blood in stool, diarrhea and nausea.  Genitourinary:  Negative for frequency and hematuria.  Musculoskeletal:  Negative for back pain, gait problem, joint swelling and neck pain.  Skin:  Negative for rash.  Neurological:  Negative for dizziness, tremors, speech difficulty and weakness.  Psychiatric/Behavioral:  Negative for agitation, dysphoric mood and sleep disturbance.  The patient is not nervous/anxious.     Objective:  BP 120/78 (BP Location: Left Arm, Patient Position: Sitting, Cuff Size: Normal)   Pulse 80   Temp 98.3 F (36.8 C) (Oral)   Ht 5\' 10"  (1.778 m)   Wt 200 lb (90.7 kg)   SpO2 98%   BMI 28.70 kg/m   BP Readings from Last 3 Encounters:  10/04/23 120/78  07/12/23 126/82  04/04/23 134/80    Wt Readings from Last 3 Encounters:  10/04/23 200 lb (90.7 kg)  07/12/23 201 lb (91.2 kg)  06/15/23 199 lb (90.3 kg)    Physical Exam Constitutional:      General: He is not in acute distress.    Appearance: He is well-developed.     Comments: NAD  Eyes:     Conjunctiva/sclera: Conjunctivae normal.     Pupils: Pupils are equal, round, and reactive to light.  Neck:     Thyroid: No thyromegaly.     Vascular: No JVD.  Cardiovascular:     Rate and Rhythm: Normal rate and regular rhythm.     Heart sounds: Normal heart sounds. No murmur heard.    No friction rub. No gallop.  Pulmonary:     Effort: Pulmonary effort is normal. No respiratory distress.     Breath sounds: Normal breath sounds. No wheezing or rales.  Chest:     Chest wall: No tenderness.  Abdominal:     General: Bowel sounds are normal. There is no distension.     Palpations: Abdomen is soft. There is no mass.     Tenderness: There is no abdominal tenderness. There is no guarding or rebound.  Musculoskeletal:        General: No tenderness. Normal range of motion.     Cervical back: Normal range of motion.  Lymphadenopathy:     Cervical: No cervical adenopathy.  Skin:    General: Skin is warm and dry.     Findings: No rash.  Neurological:     Mental Status: He is alert and oriented to person, place, and time.     Cranial Nerves: No cranial nerve deficit.     Motor: No abnormal muscle tone.     Coordination: Coordination normal.     Gait: Gait normal.     Deep Tendon Reflexes: Reflexes are normal and symmetric.  Psychiatric:        Behavior: Behavior normal.         Thought Content: Thought content normal.        Judgment: Judgment normal.     Lab Results  Component Value Date   WBC 6.0 10/03/2023   HGB 14.3 10/03/2023   HCT 43.4 10/03/2023   PLT 253.0 10/03/2023   GLUCOSE 145 (H) 10/03/2023   CHOL 154 10/03/2023   TRIG 80.0 10/03/2023  HDL 58.30 10/03/2023   LDLCALC 80 10/03/2023   ALT 22 10/03/2023   AST 18 10/03/2023   NA 137 10/03/2023   K 4.5 10/03/2023   CL 105 10/03/2023   CREATININE 0.91 10/03/2023   BUN 20 10/03/2023   CO2 25 10/03/2023   TSH 3.28 10/03/2023   PSA 0.77 10/03/2023   HGBA1C 7.4 (H) 10/03/2023   MICROALBUR 4.7 (H) 10/03/2023    No results found.  Assessment & Plan:   Problem List Items Addressed This Visit     Dyslipidemia    Continue on Crestor 10 mg      Diabetes type 2, controlled (HCC) - Primary    Worse - improve diet F/u w/Dr Shamleffer On Metformin, Farxiga 10 mg/d On Libre 3      Hypertension    Continue on Crestor 10 mg      Type 2 diabetes mellitus without complication, without long-term current use of insulin (HCC)    On Libre 3         No orders of the defined types were placed in this encounter.     Follow-up: Return in about 6 months (around 04/03/2024) for a follow-up visit.  Sonda Primes, MD

## 2023-10-08 DIAGNOSIS — J3081 Allergic rhinitis due to animal (cat) (dog) hair and dander: Secondary | ICD-10-CM | POA: Diagnosis not present

## 2023-10-08 DIAGNOSIS — J301 Allergic rhinitis due to pollen: Secondary | ICD-10-CM | POA: Diagnosis not present

## 2023-10-08 DIAGNOSIS — J3089 Other allergic rhinitis: Secondary | ICD-10-CM | POA: Diagnosis not present

## 2023-10-15 DIAGNOSIS — J3089 Other allergic rhinitis: Secondary | ICD-10-CM | POA: Diagnosis not present

## 2023-10-15 DIAGNOSIS — J3081 Allergic rhinitis due to animal (cat) (dog) hair and dander: Secondary | ICD-10-CM | POA: Diagnosis not present

## 2023-10-15 DIAGNOSIS — J301 Allergic rhinitis due to pollen: Secondary | ICD-10-CM | POA: Diagnosis not present

## 2023-10-22 DIAGNOSIS — J301 Allergic rhinitis due to pollen: Secondary | ICD-10-CM | POA: Diagnosis not present

## 2023-10-22 DIAGNOSIS — J3089 Other allergic rhinitis: Secondary | ICD-10-CM | POA: Diagnosis not present

## 2023-10-22 DIAGNOSIS — J3081 Allergic rhinitis due to animal (cat) (dog) hair and dander: Secondary | ICD-10-CM | POA: Diagnosis not present

## 2023-10-29 DIAGNOSIS — J3089 Other allergic rhinitis: Secondary | ICD-10-CM | POA: Diagnosis not present

## 2023-10-29 DIAGNOSIS — J3081 Allergic rhinitis due to animal (cat) (dog) hair and dander: Secondary | ICD-10-CM | POA: Diagnosis not present

## 2023-10-29 DIAGNOSIS — J301 Allergic rhinitis due to pollen: Secondary | ICD-10-CM | POA: Diagnosis not present

## 2023-11-05 DIAGNOSIS — J3081 Allergic rhinitis due to animal (cat) (dog) hair and dander: Secondary | ICD-10-CM | POA: Diagnosis not present

## 2023-11-05 DIAGNOSIS — J301 Allergic rhinitis due to pollen: Secondary | ICD-10-CM | POA: Diagnosis not present

## 2023-11-05 DIAGNOSIS — J3089 Other allergic rhinitis: Secondary | ICD-10-CM | POA: Diagnosis not present

## 2023-11-12 DIAGNOSIS — J301 Allergic rhinitis due to pollen: Secondary | ICD-10-CM | POA: Diagnosis not present

## 2023-11-12 DIAGNOSIS — J3089 Other allergic rhinitis: Secondary | ICD-10-CM | POA: Diagnosis not present

## 2023-11-12 DIAGNOSIS — J3081 Allergic rhinitis due to animal (cat) (dog) hair and dander: Secondary | ICD-10-CM | POA: Diagnosis not present

## 2023-11-19 DIAGNOSIS — J3081 Allergic rhinitis due to animal (cat) (dog) hair and dander: Secondary | ICD-10-CM | POA: Diagnosis not present

## 2023-11-19 DIAGNOSIS — J3089 Other allergic rhinitis: Secondary | ICD-10-CM | POA: Diagnosis not present

## 2023-11-19 DIAGNOSIS — J301 Allergic rhinitis due to pollen: Secondary | ICD-10-CM | POA: Diagnosis not present

## 2023-11-26 DIAGNOSIS — J3081 Allergic rhinitis due to animal (cat) (dog) hair and dander: Secondary | ICD-10-CM | POA: Diagnosis not present

## 2023-11-26 DIAGNOSIS — J3089 Other allergic rhinitis: Secondary | ICD-10-CM | POA: Diagnosis not present

## 2023-11-26 DIAGNOSIS — J301 Allergic rhinitis due to pollen: Secondary | ICD-10-CM | POA: Diagnosis not present

## 2023-12-01 ENCOUNTER — Other Ambulatory Visit: Payer: Self-pay | Admitting: Internal Medicine

## 2023-12-03 DIAGNOSIS — J3081 Allergic rhinitis due to animal (cat) (dog) hair and dander: Secondary | ICD-10-CM | POA: Diagnosis not present

## 2023-12-03 DIAGNOSIS — J301 Allergic rhinitis due to pollen: Secondary | ICD-10-CM | POA: Diagnosis not present

## 2023-12-03 DIAGNOSIS — J3089 Other allergic rhinitis: Secondary | ICD-10-CM | POA: Diagnosis not present

## 2023-12-06 ENCOUNTER — Other Ambulatory Visit: Payer: Self-pay | Admitting: Internal Medicine

## 2023-12-06 ENCOUNTER — Encounter: Payer: Self-pay | Admitting: Internal Medicine

## 2023-12-06 ENCOUNTER — Other Ambulatory Visit: Payer: Self-pay

## 2023-12-06 MED ORDER — ONETOUCH VERIO VI STRP: ORAL_STRIP | 5 refills | Status: DC

## 2023-12-07 ENCOUNTER — Telehealth: Payer: Self-pay

## 2023-12-07 DIAGNOSIS — J3081 Allergic rhinitis due to animal (cat) (dog) hair and dander: Secondary | ICD-10-CM | POA: Diagnosis not present

## 2023-12-07 DIAGNOSIS — J3089 Other allergic rhinitis: Secondary | ICD-10-CM | POA: Diagnosis not present

## 2023-12-07 DIAGNOSIS — J301 Allergic rhinitis due to pollen: Secondary | ICD-10-CM | POA: Diagnosis not present

## 2023-12-07 NOTE — Telephone Encounter (Signed)
One touch test strips need PA

## 2023-12-10 ENCOUNTER — Other Ambulatory Visit (HOSPITAL_COMMUNITY): Payer: Self-pay

## 2023-12-10 ENCOUNTER — Telehealth: Payer: Self-pay | Admitting: Pharmacy Technician

## 2023-12-10 DIAGNOSIS — J3089 Other allergic rhinitis: Secondary | ICD-10-CM | POA: Diagnosis not present

## 2023-12-10 DIAGNOSIS — J3081 Allergic rhinitis due to animal (cat) (dog) hair and dander: Secondary | ICD-10-CM | POA: Diagnosis not present

## 2023-12-10 DIAGNOSIS — J301 Allergic rhinitis due to pollen: Secondary | ICD-10-CM | POA: Diagnosis not present

## 2023-12-10 NOTE — Telephone Encounter (Signed)
Pharmacy Patient Advocate Encounter   Received notification from Pt Calls Messages that prior authorization for One Touch Verio Test Strips is required/requested.   Insurance verification completed.   The patient is insured through [employer] .   Per test claim: Must bill through part B. Called [facility]. They are able to bill through part B, but because the pt isn't insulin dependant they will only allow for once a day testing.  Please send a new script for them to process. Thanks.

## 2023-12-10 NOTE — Telephone Encounter (Signed)
New Encounter created for follow up. For additional info see Pharmacy Prior Auth telephone encounter from 12/10/23.

## 2023-12-17 DIAGNOSIS — J301 Allergic rhinitis due to pollen: Secondary | ICD-10-CM | POA: Diagnosis not present

## 2023-12-17 DIAGNOSIS — J3089 Other allergic rhinitis: Secondary | ICD-10-CM | POA: Diagnosis not present

## 2023-12-17 DIAGNOSIS — J3081 Allergic rhinitis due to animal (cat) (dog) hair and dander: Secondary | ICD-10-CM | POA: Diagnosis not present

## 2023-12-18 MED ORDER — ONETOUCH VERIO VI STRP: ORAL_STRIP | 5 refills | Status: AC

## 2023-12-18 NOTE — Addendum Note (Signed)
 Addended by: Lisabeth Pick on: 12/18/2023 07:47 AM   Modules accepted: Orders

## 2023-12-25 DIAGNOSIS — J301 Allergic rhinitis due to pollen: Secondary | ICD-10-CM | POA: Diagnosis not present

## 2023-12-25 DIAGNOSIS — J3089 Other allergic rhinitis: Secondary | ICD-10-CM | POA: Diagnosis not present

## 2023-12-25 DIAGNOSIS — J3081 Allergic rhinitis due to animal (cat) (dog) hair and dander: Secondary | ICD-10-CM | POA: Diagnosis not present

## 2023-12-31 DIAGNOSIS — J3089 Other allergic rhinitis: Secondary | ICD-10-CM | POA: Diagnosis not present

## 2023-12-31 DIAGNOSIS — J301 Allergic rhinitis due to pollen: Secondary | ICD-10-CM | POA: Diagnosis not present

## 2023-12-31 DIAGNOSIS — J3081 Allergic rhinitis due to animal (cat) (dog) hair and dander: Secondary | ICD-10-CM | POA: Diagnosis not present

## 2024-01-03 DIAGNOSIS — J3089 Other allergic rhinitis: Secondary | ICD-10-CM | POA: Diagnosis not present

## 2024-01-03 DIAGNOSIS — J3081 Allergic rhinitis due to animal (cat) (dog) hair and dander: Secondary | ICD-10-CM | POA: Diagnosis not present

## 2024-01-03 DIAGNOSIS — J301 Allergic rhinitis due to pollen: Secondary | ICD-10-CM | POA: Diagnosis not present

## 2024-01-03 DIAGNOSIS — J4520 Mild intermittent asthma, uncomplicated: Secondary | ICD-10-CM | POA: Diagnosis not present

## 2024-01-07 DIAGNOSIS — J301 Allergic rhinitis due to pollen: Secondary | ICD-10-CM | POA: Diagnosis not present

## 2024-01-07 DIAGNOSIS — J3081 Allergic rhinitis due to animal (cat) (dog) hair and dander: Secondary | ICD-10-CM | POA: Diagnosis not present

## 2024-01-07 DIAGNOSIS — J3089 Other allergic rhinitis: Secondary | ICD-10-CM | POA: Diagnosis not present

## 2024-01-09 ENCOUNTER — Encounter: Payer: Self-pay | Admitting: Internal Medicine

## 2024-01-09 ENCOUNTER — Ambulatory Visit: Payer: Medicare Other | Admitting: Internal Medicine

## 2024-01-09 VITALS — BP 130/80 | HR 88 | Ht 70.0 in | Wt 202.4 lb

## 2024-01-09 DIAGNOSIS — Z7984 Long term (current) use of oral hypoglycemic drugs: Secondary | ICD-10-CM

## 2024-01-09 DIAGNOSIS — E1165 Type 2 diabetes mellitus with hyperglycemia: Secondary | ICD-10-CM

## 2024-01-09 DIAGNOSIS — L84 Corns and callosities: Secondary | ICD-10-CM | POA: Insufficient documentation

## 2024-01-09 LAB — POCT GLYCOSYLATED HEMOGLOBIN (HGB A1C): Hemoglobin A1C: 7.2 % — AB (ref 4.0–5.6)

## 2024-01-09 NOTE — Patient Instructions (Signed)
-   Continue  Metformin 500 mg, Two tablets with Breakfast and two tablets with Supper - Continue Farxiga 10  mg, 1 tablet daily     HOW TO TREAT LOW BLOOD SUGARS (Blood sugar LESS THAN 70 MG/DL) Please follow the RULE OF 15 for the treatment of hypoglycemia treatment (when your (blood sugars are less than 70 mg/dL)   STEP 1: Take 15 grams of carbohydrates when your blood sugar is low, which includes:  3-4 GLUCOSE TABS  OR 3-4 OZ OF JUICE OR REGULAR SODA OR ONE TUBE OF GLUCOSE GEL    STEP 2: RECHECK blood sugar in 15 MINUTES STEP 3: If your blood sugar is still low at the 15 minute recheck --> then, go back to STEP 1 and treat AGAIN with another 15 grams of carbohydrates.

## 2024-01-09 NOTE — Progress Notes (Signed)
Name: Brian Ayers  Age/ Sex: 73 y.o., male   MRN/ DOB: [id number], Jan 22, 1951     PCP: Tresa Garter, MD   Reason for Endocrinology Evaluation: Type 2 Diabetes Mellitus  Initial Endocrine Consultative Visit: 02/07/2021    PATIENT IDENTIFIER: Brian Ayers is a 73 y.o. male with a past medical history of  T2DM, Asthma and Dyslipidemia, with Hx of alcohol abuse. The patient has followed with Endocrinology clinic since 02/07/2021 for consultative assistance with management of his diabetes.  DIABETIC HISTORY:  Brian Ayers was diagnosed with DM in 2015. He has been on Metformin, repaglinide started 2/20222  His hemoglobin A1c has ranged from 6.7% in 2019, peaking at 7.6% in 2022.   On his intial visit he had an A1c of 7.6 %. He was just started on Repaglinide so we continued this until 04/2021 due to recurrent hypoglycemia. We increased Metformin    Restarted Repaglinide in 06/2021 due to hyperglycemia with Bg's in 200's. But this was discontinued in 08/2021 and started on Farxiga  with an A1c 6.3 %   SUBJECTIVE:   During the last visit (07/12/2023): A1c 6.8 %     Today (01/09/2024): Brian Ayers is here for a follow up on diabetes management.  He checks his blood sugars multiple  times daily, through CGM. The patient has had hypoglycemic episodes, unclear the accuracy of freestyle libre.   Patient continues to follow-up with allergy and immunology for allergic rhinitis  Denies nausea, vomiting Denies constipation  or diarrhea  Patient is complaining about callus formation at the right plantar surface, he was seen by Dr. Charlsie Merles before and was advised to use pumice stone   HOME DIABETES REGIMEN:  Metformin 500 mg 2 tabs BID  Farxiga 10 mg daily     Statin: yes ACE-I/ARB: yes     CONTINUOUS GLUCOSE MONITORING RECORD INTERPRETATION    Dates of Recording: 1/9-1/22/2025  Sensor description:freestyle libre  Results statistics:   CGM use % of time 95  Average  and SD 146/21.7  Time in range 87 %  % Time Above 180 13  % Time above 250 0  % Time Below target 0   Glycemic patterns summary: BGs are optimal overnight and most of the day  Hyperglycemic episodes at postprandial   Hypoglycemic episodes occurred hypoglycemia noted overnight  Overnight periods: Trends down occasionally     DIABETIC COMPLICATIONS: Microvascular complications:  Posterior vitreous detachment but no DR Denies: CKD, neuropathy  Last Eye Exam: Completed 07/11/2023  Macrovascular complications:   Denies: CAD, CVA, PVD   HISTORY:  Past Medical History:  Past Medical History:  Diagnosis Date   Alcohol abuse    hixtory of   Allergy    Asthma childhood   Cancer (HCC)    skin cancer on face X1   Cataract    Chronic kidney disease    Diabetes mellitus without complication (HCC)    GERD (gastroesophageal reflux disease)    History of kidney stones    Hx of colonic polyps    Hyperlipidemia    Rhinitis    on allery shots   Past Surgical History:  Past Surgical History:  Procedure Laterality Date   CATARACT EXTRACTION Left 2020   COLONOSCOPY  03/04/2019   Dr.Perry   EXTRACORPOREAL SHOCK WAVE LITHOTRIPSY Right 03/14/2018   Procedure: RIGHT EXTRACORPOREAL SHOCK WAVE LITHOTRIPSY (ESWL);  Surgeon: Heloise Purpura, MD;  Location: [facility];  Service: Urology;  Laterality: Right;   hernia with hydrocele  1973  left shoulder pin setting  1978   TONSILLECTOMY  1967   WISDOM TOOTH EXTRACTION  2004   Social History:  reports that he has quit smoking. He quit smokeless tobacco use about 38 years ago.  His smokeless tobacco use included snuff and chew. He reports that he does not drink alcohol and does not use drugs. Family History:  Family History  Problem Relation Age of Onset   Cancer Mother        breast (hx of)   Other Mother        trigeminal neuralgia s/p gamma knife/ CHF   Hypertension Father    Diabetes Neg Hx    Colon cancer Neg Hx    Stomach cancer  Neg Hx    Rectal cancer Neg Hx      HOME MEDICATIONS: Allergies as of 01/09/2024       Reactions   Symbicort [budesonide-formoterol Fumarate] Anaphylaxis   Anaphylactic reaction to Symbicort vs other (???) after 10 days of use. Went to ER w/rash/SOB/swollen lips.Using Advair ok   Simvastatin    GERD        Medication List        Accurate as of January 09, 2024  9:36 AM. If you have any questions, ask your nurse or doctor.          Arexvy 120 MCG/0.5ML injection Generic drug: RSV vaccine recomb adjuvanted Inject into the muscle.   aspirin 81 MG chewable tablet Chew 81 mg by mouth daily.   azelastine 0.1 % nasal spray Commonly known as: ASTELIN USE 1 SPRAY INTO EACH NOSTRIL TWICE A DAY   Biotin 2500 MCG Caps Take 2,500 mcg by mouth daily.   clotrimazole-betamethasone cream Commonly known as: LOTRISONE Apply 1 Application topically 2 (two) times daily.   dapagliflozin propanediol 10 MG Tabs tablet Commonly known as: Farxiga Take 1 tablet (10 mg total) by mouth daily before breakfast.   diphenhydrAMINE 25 MG tablet Commonly known as: Benadryl Allergy Take 2 tablets (50 mg total) by mouth every 8 (eight) hours as needed for allergies (allergic reaction).   EPINEPHrine 0.3 mg/0.3 mL Soaj injection Commonly known as: EPI-PEN as needed.   famotidine 20 MG tablet Commonly known as: PEPCID TAKE 1 TABLET BY MOUTH TWICE A DAY   fexofenadine 180 MG tablet Commonly known as: ALLEGRA Take 180 mg by mouth daily.   Fluocinolone Acetonide Body 0.01 % Oil SMARTSIG:sparingly Topical 3 Times Daily PRN   fluticasone 50 MCG/ACT nasal spray Commonly known as: FLONASE   fluticasone-salmeterol 115-21 MCG/ACT inhaler Commonly known as: ADVAIR HFA Inhale 2 puffs into the lungs 2 (two) times daily.   FreeStyle Libre 3 Reader Devi 1 Device by Does not apply route continuous. Use to monitor blood sugars   FreeStyle Libre 3 Sensor Misc PLACE 1 SENSOR ON THE SKIN EVERY 14  DAYS. USE TO CHECK GLUCOSE CONTINUOUSLY   irbesartan 150 MG tablet Commonly known as: AVAPRO TAKE 1 TABLET BY MOUTH EVERY DAY   metFORMIN 500 MG 24 hr tablet Commonly known as: GLUCOPHAGE-XR TAKE 2 TABLETS BY MOUTH TWICE A DAY   OneTouch Delica Lancets 30G Misc USE TO CHECK BLOOD SUGER TWICE A DAY   OneTouch Verio Flex System w/Device Kit USE AS DIRECTED TO CHECK BLOOD SUGARS   OneTouch Verio test strip Generic drug: glucose blood Use to check blood sugar 1x daily   OVER THE COUNTER MEDICATION Saw Palmetto 450 mg, twice daily.   OVER THE COUNTER MEDICATION Vitamin D 3 One capsule  daily.   pseudoephedrine 30 MG tablet Commonly known as: Sudafed Take 2 tablets (60 mg total) by mouth every 4 (four) hours as needed for congestion (allergic reaction).   rosuvastatin 10 MG tablet Commonly known as: CRESTOR Take 1 tablet (10 mg total) by mouth daily.   Saw Palmetto 450 MG Caps Take 450 mg by mouth in the morning and at bedtime.   sildenafil 100 MG tablet Commonly known as: VIAGRA TAKE 1 TABLET BY MOUTH EVERY DAY AS NEEDED FOR ERECTILE DYSFUNCTION   UNABLE TO FIND Med Name: Takes allergy shots weekly   Ventolin HFA 108 (90 Base) MCG/ACT inhaler Generic drug: albuterol Inhale into the lungs every 6 (six) hours as needed for wheezing or shortness of breath.         OBJECTIVE:   Vital Signs: Pulse 88   Ht 5\' 10"  (1.778 m)   Wt 202 lb 6.4 oz (91.8 kg)   SpO2 99%   BMI 29.04 kg/m   Wt Readings from Last 3 Encounters:  01/09/24 202 lb 6.4 oz (91.8 kg)  10/04/23 200 lb (90.7 kg)  07/12/23 201 lb (91.2 kg)     Exam: General: Pt appears well and is in NAD  Lungs: Clear with good BS bilat   Heart: RRR    Abdomen: Soft, nontender  Extremities: No pretibial edema.   Neuro: MS is good with appropriate affect, pt is alert and Ox3    DM foot exam:  01/09/2024  The skin of the feet is without sores or ulcerations, patient with bilateral plantar callus  formation The pedal pulses are 2+ on right and 2+ on left. The sensation is intact to a screening 5.07, 10 gram monofilament bilaterally    DATA REVIEWED:  Lab Results  Component Value Date   HGBA1C 7.4 (H) 10/03/2023   HGBA1C 6.8 (A) 07/12/2023   HGBA1C 7.0 (A) 01/08/2023     Latest Reference Range & Units 10/03/23 08:08  Sodium 135 - 145 mEq/L 137  Potassium 3.5 - 5.1 mEq/L 4.5  Chloride 96 - 112 mEq/L 105  CO2 19 - 32 mEq/L 25  Glucose 70 - 99 mg/dL 440 (H)  BUN 6 - 23 mg/dL 20  Creatinine 1.02 - 7.25 mg/dL 3.66  Calcium 8.4 - 44.0 mg/dL 9.1  Alkaline Phosphatase 39 - 117 U/L 59  Albumin 3.5 - 5.2 g/dL 4.1  AST 0 - 37 U/L 18  ALT 0 - 53 U/L 22  Total Protein 6.0 - 8.3 g/dL 6.0  Total Bilirubin 0.2 - 1.2 mg/dL 0.9  GFR >34.74 mL/min 85.49    Latest Reference Range & Units 10/03/23 08:08  Total CHOL/HDL Ratio  3  Cholesterol 0 - 200 mg/dL 259  HDL Cholesterol >56.38 mg/dL 75.64  LDL (calc) 0 - 99 mg/dL 80  MICROALB/CREAT RATIO 0.0 - 30.0 mg/g 6.3  NonHDL  95.71  Triglycerides 0.0 - 149.0 mg/dL 33.2  VLDL 0.0 - 95.1 mg/dL 88.4       ASSESSMENT / PLAN / RECOMMENDATIONS:   1) Type 2 Diabetes Mellitus, Sub-Optimally controlled, Without complications - Most recent A1c of 7.2%. Goal A1c < 7.0 %.     -A1c slightly above goal -In reviewing his CGM data, patient has been noted with postprandial hyperglycemia at supper, I did encourage the patient to reduce the amount of carbohydrates consumed at suppertime, I did explain to the patient that if his A1c remains above goal we have no other option but to add a third agent -Historically repaglinide has  caused hypoglycemia -No changes at this time  MEDICATIONS: Continue  Metformin 500 mg, Two tablets with Breakfast and two tablets with Supper Continue  Farxiga 10 mg , 1 tablet daily    EDUCATION / INSTRUCTIONS: BG monitoring instructions: Patient is instructed to check his blood sugars 3 times a day, before meals  . Call [facility] if: BG persistently < 70  I reviewed the Rule of 15 for the treatment of hypoglycemia in detail with the patient. Literature supplied.    2) Diabetic complications:  Eye: Does not have known diabetic retinopathy.  Neuro/ Feet: Does not have known diabetic peripheral neuropathy .  Renal: Patient does not have known baseline CKD. He   is  on an ACEI/ARB at present.   3) Callus formation:  -Bilateral plantar callus formation noted -Patient follows with Dr. Charlsie Merles with podiatry, he was advised to use pumice stone -I have also asked him to use cushioned Band-Aids over the area to slow down progression    F/U in 3 months   I spent 30 minutes preparing to see the patient by review of recent labs, imaging and procedures, obtaining and reviewing separately obtained history, communicating with the patient, ordering medications, tests or procedures, and documenting clinical information in the EHR including the differential Dx, treatment, and any further evaluation and other management   Signed electronically by: Lyndle Herrlich, MD  [facility]  [facility] [address] [place] [place], [place] [address] Phone: [phone] FAX: [phone]   CC: Tresa Garter, MD [address] [place] [address] Phone: [phone]  Fax: [phone]  Return to Endocrinology clinic as below: Future Appointments  Date Time Provider Department Center  01/09/2024  9:50 AM Marcille Barman, Konrad Dolores, MD [facility] None  04/03/2024 10:20 AM Plotnikov, Georgina Quint, MD [facility] None

## 2024-01-10 ENCOUNTER — Encounter: Payer: Self-pay | Admitting: Internal Medicine

## 2024-01-14 DIAGNOSIS — J301 Allergic rhinitis due to pollen: Secondary | ICD-10-CM | POA: Diagnosis not present

## 2024-01-14 DIAGNOSIS — J3089 Other allergic rhinitis: Secondary | ICD-10-CM | POA: Diagnosis not present

## 2024-01-14 DIAGNOSIS — J3081 Allergic rhinitis due to animal (cat) (dog) hair and dander: Secondary | ICD-10-CM | POA: Diagnosis not present

## 2024-01-21 DIAGNOSIS — J301 Allergic rhinitis due to pollen: Secondary | ICD-10-CM | POA: Diagnosis not present

## 2024-01-21 DIAGNOSIS — J3081 Allergic rhinitis due to animal (cat) (dog) hair and dander: Secondary | ICD-10-CM | POA: Diagnosis not present

## 2024-01-21 DIAGNOSIS — J3089 Other allergic rhinitis: Secondary | ICD-10-CM | POA: Diagnosis not present

## 2024-01-25 ENCOUNTER — Encounter: Payer: Self-pay | Admitting: Internal Medicine

## 2024-01-25 ENCOUNTER — Other Ambulatory Visit: Payer: Self-pay

## 2024-01-25 DIAGNOSIS — E119 Type 2 diabetes mellitus without complications: Secondary | ICD-10-CM

## 2024-01-25 MED ORDER — ONETOUCH VERIO FLEX SYSTEM W/DEVICE KIT: PACK | 0 refills | Status: AC

## 2024-01-29 DIAGNOSIS — J3081 Allergic rhinitis due to animal (cat) (dog) hair and dander: Secondary | ICD-10-CM | POA: Diagnosis not present

## 2024-01-29 DIAGNOSIS — J3089 Other allergic rhinitis: Secondary | ICD-10-CM | POA: Diagnosis not present

## 2024-01-29 DIAGNOSIS — J301 Allergic rhinitis due to pollen: Secondary | ICD-10-CM | POA: Diagnosis not present

## 2024-02-04 DIAGNOSIS — J3081 Allergic rhinitis due to animal (cat) (dog) hair and dander: Secondary | ICD-10-CM | POA: Diagnosis not present

## 2024-02-04 DIAGNOSIS — J301 Allergic rhinitis due to pollen: Secondary | ICD-10-CM | POA: Diagnosis not present

## 2024-02-04 DIAGNOSIS — J3089 Other allergic rhinitis: Secondary | ICD-10-CM | POA: Diagnosis not present

## 2024-02-11 DIAGNOSIS — J3081 Allergic rhinitis due to animal (cat) (dog) hair and dander: Secondary | ICD-10-CM | POA: Diagnosis not present

## 2024-02-11 DIAGNOSIS — J301 Allergic rhinitis due to pollen: Secondary | ICD-10-CM | POA: Diagnosis not present

## 2024-02-11 DIAGNOSIS — J3089 Other allergic rhinitis: Secondary | ICD-10-CM | POA: Diagnosis not present

## 2024-02-18 DIAGNOSIS — J301 Allergic rhinitis due to pollen: Secondary | ICD-10-CM | POA: Diagnosis not present

## 2024-02-18 DIAGNOSIS — J3081 Allergic rhinitis due to animal (cat) (dog) hair and dander: Secondary | ICD-10-CM | POA: Diagnosis not present

## 2024-02-18 DIAGNOSIS — J3089 Other allergic rhinitis: Secondary | ICD-10-CM | POA: Diagnosis not present

## 2024-02-22 ENCOUNTER — Other Ambulatory Visit: Payer: Self-pay | Admitting: Internal Medicine

## 2024-02-25 ENCOUNTER — Encounter: Payer: Self-pay | Admitting: Internal Medicine

## 2024-02-25 DIAGNOSIS — J301 Allergic rhinitis due to pollen: Secondary | ICD-10-CM | POA: Diagnosis not present

## 2024-02-25 DIAGNOSIS — J3089 Other allergic rhinitis: Secondary | ICD-10-CM | POA: Diagnosis not present

## 2024-02-25 DIAGNOSIS — J3081 Allergic rhinitis due to animal (cat) (dog) hair and dander: Secondary | ICD-10-CM | POA: Diagnosis not present

## 2024-02-26 ENCOUNTER — Encounter: Payer: Self-pay | Admitting: Internal Medicine

## 2024-02-28 ENCOUNTER — Ambulatory Visit
Admission: RE | Admit: 2024-02-28 | Discharge: 2024-02-28 | Disposition: A | Discharge status: Home / Self Care | Source: Ambulatory Visit | Attending: Emergency Medicine | Admitting: Emergency Medicine

## 2024-02-28 VITALS — BP 154/69 | HR 94 | Temp 98.8°F | Resp 15

## 2024-02-28 DIAGNOSIS — J101 Influenza due to other identified influenza virus with other respiratory manifestations: Secondary | ICD-10-CM | POA: Diagnosis not present

## 2024-02-28 LAB — POC COVID19/FLU A&B COMBO
Covid Antigen, POC: NEGATIVE
Influenza A Antigen, POC: POSITIVE — AB
Influenza B Antigen, POC: NEGATIVE

## 2024-02-28 MED ORDER — BENZONATATE 100 MG PO CAPS: 100.0000 mg | ORAL_CAPSULE | Freq: Three times a day (TID) | ORAL | 0 refills | Status: DC | PRN

## 2024-02-28 MED ORDER — ACETAMINOPHEN 325 MG PO TABS
650.0000 mg | ORAL_TABLET | Freq: Once | ORAL | Status: AC
  Administered 2024-02-28: 650 mg via ORAL

## 2024-02-28 MED ORDER — OSELTAMIVIR PHOSPHATE 75 MG PO CAPS
75.0000 mg | ORAL_CAPSULE | Freq: Two times a day (BID) | ORAL | 0 refills | Status: AC
Start: 2024-02-28 — End: 2024-03-04

## 2024-02-28 NOTE — ED Provider Notes (Signed)
 [facility]    CSN: [id number] Arrival date & time: 02/28/24  1019      History   Chief Complaint Chief Complaint  Patient presents with   Fever    Cough and fever would like to be tested first flu and covid - Entered by patient   Generalized Body Aches    HPI Brian Ayers is a 73 y.o. male.  Yesterday developed slight cough and fever, body aches Tmax 101 Used Flonase and albuterol inhaler this morning No known sick contacts, no recent travel  Requesting COVID and flu test  History of asthma, no wheezing or shortness of breath   Past Medical History:  Diagnosis Date   Alcohol abuse    hixtory of   Allergy    Asthma childhood   Cancer (HCC)    skin cancer on face X1   Cataract    Chronic kidney disease    Diabetes mellitus without complication (HCC)    GERD (gastroesophageal reflux disease)    History of kidney stones    Hx of colonic polyps    Hyperlipidemia    Rhinitis    on allery shots    Patient Active Problem List   Diagnosis Date Noted   Callus of foot 01/09/2024   Tachycardia 09/28/2022   Allergic reaction 04/18/2022   Allergic rhinitis due to animal (cat) (dog) hair and dander 03/29/2022   Allergic rhinitis due to pollen 03/29/2022   Mild intermittent asthma 03/29/2022   Diabetes mellitus (HCC) 08/26/2021   Type 2 diabetes mellitus without complication, without long-term current use of insulin (HCC) 04/22/2021   Umbilical hernia 03/28/2021   Situational anxiety 02/03/2021   Presbycusis of both ears 12/24/2020   Nonallopathic lesion of lumbosacral region 10/21/2018   Nonallopathic lesion of sacral region 10/21/2018   Nonallopathic lesion of thoracic region 10/21/2018   Lumbar back pain with radiculopathy affecting left lower extremity 07/01/2018   Renal stone 04/16/2018   Erectile dysfunction 11/06/2016   Tinea corporis 11/06/2016   Asthma, chronic 03/13/2016   Trigger finger, acquired 01/12/2015   Hypertension 09/09/2014    Metatarsalgia 08/10/2014   Diabetes type 2, controlled (HCC) 06/23/2014   Well adult exam 05/25/2014   GERD (gastroesophageal reflux disease) 05/25/2014   Elevated blood pressure reading without diagnosis of hypertension 11/01/2012   Routine health maintenance 09/14/2011   History of colonic polyps 05/17/2008   PLANTAR FASCIITIS, BILATERAL 05/15/2008   ILIOTIBIAL BAND SYNDROME, LEFT KNEE 05/15/2008   TALIPES CAVUS 05/15/2008   Dyslipidemia 09/16/2007   RHINITIS 09/16/2007    Past Surgical History:  Procedure Laterality Date   CATARACT EXTRACTION Left 2020   COLONOSCOPY  03/04/2019   Dr.Perry   EXTRACORPOREAL SHOCK WAVE LITHOTRIPSY Right 03/14/2018   Procedure: RIGHT EXTRACORPOREAL SHOCK WAVE LITHOTRIPSY (ESWL);  Surgeon: Heloise Purpura, MD;  Location: [facility];  Service: Urology;  Laterality: Right;   hernia with hydrocele  1973   left shoulder pin setting  1978   TONSILLECTOMY  1967   WISDOM TOOTH EXTRACTION  2004       Home Medications    Prior to Admission medications   Medication Sig Start Date End Date Taking? Authorizing Provider  benzonatate (TESSALON) 100 MG capsule Take 1 capsule (100 mg total) by mouth 3 (three) times daily as needed for cough. 02/28/24  Yes Jushua Waltman, Lurena Joiner, PA-C  oseltamivir (TAMIFLU) 75 MG capsule Take 1 capsule (75 mg total) by mouth every 12 (twelve) hours for 5 days. 02/28/24 03/04/24 Yes Kalah Pflum, Lurena Joiner, PA-C  albuterol (VENTOLIN HFA) 108 (90 Base) MCG/ACT inhaler Inhale into the lungs every 6 (six) hours as needed for wheezing or shortness of breath.    [provider]  aspirin 81 MG chewable tablet Chew 81 mg by mouth daily.    [provider]  azelastine (ASTELIN) 0.1 % nasal spray USE 1 SPRAY INTO EACH NOSTRIL TWICE A DAY 06/02/19   [provider]  Biotin 2500 MCG CAPS Take 2,500 mcg by mouth daily.    [provider]  Blood Glucose Monitoring Suppl (ONETOUCH VERIO FLEX SYSTEM) w/Device KIT USE AS DIRECTED TO  CHECK BLOOD SUGARS 01/25/24   Shamleffer, Konrad Dolores, MD  clotrimazole-betamethasone (LOTRISONE) cream APPLY TO AFFECTED AREA TWICE A DAY 02/22/24   Plotnikov, Georgina Quint, MD  Continuous Blood Gluc Receiver (FREESTYLE LIBRE 3 READER) DEVI 1 Device by Does not apply route continuous. Use to monitor blood sugars 02/21/23   Shamleffer, Konrad Dolores, MD  Continuous Glucose Sensor (FREESTYLE LIBRE 3 SENSOR) MISC PLACE 1 SENSOR ON THE SKIN EVERY 14 DAYS. USE TO CHECK GLUCOSE CONTINUOUSLY 05/13/23   Shamleffer, Konrad Dolores, MD  dapagliflozin propanediol (FARXIGA) 10 MG TABS tablet Take 1 tablet (10 mg total) by mouth daily before breakfast. 01/08/23   Shamleffer, Konrad Dolores, MD  diphenhydrAMINE (BENADRYL ALLERGY) 25 MG tablet Take 2 tablets (50 mg total) by mouth every 8 (eight) hours as needed for allergies (allergic reaction). 09/28/22   Plotnikov, Georgina Quint, MD  EPINEPHrine 0.3 mg/0.3 mL IJ SOAJ injection as needed. 04/03/16   [provider]  famotidine (PEPCID) 20 MG tablet TAKE 1 TABLET BY MOUTH TWICE A DAY 03/15/23   Plotnikov, Georgina Quint, MD  fexofenadine (ALLEGRA) 180 MG tablet Take 180 mg by mouth daily. 06/16/20   [provider]  Fluocinolone Acetonide Body 0.01 % OIL SMARTSIG:sparingly Topical 3 Times Daily PRN 04/13/22   [provider]  fluticasone Aleda Grana) 50 MCG/ACT nasal spray  06/02/19   [provider]  fluticasone-salmeterol (ADVAIR HFA) 115-21 MCG/ACT inhaler Inhale 2 puffs into the lungs 2 (two) times daily.    [provider]  glucose blood (ONETOUCH VERIO) test strip Use to check blood sugar 1x daily 12/18/23   Shamleffer, Konrad Dolores, MD  irbesartan (AVAPRO) 150 MG tablet TAKE 1 TABLET BY MOUTH EVERY DAY 12/03/23   Plotnikov, Georgina Quint, MD  metFORMIN (GLUCOPHAGE-XR) 500 MG 24 hr tablet TAKE 2 TABLETS BY MOUTH TWICE A DAY 09/10/23   Shamleffer, Konrad Dolores, MD  OneTouch Delica Lancets 30G MISC USE TO CHECK BLOOD SUGER TWICE A DAY  11/16/21   Plotnikov, Georgina Quint, MD  OVER THE COUNTER MEDICATION Saw Palmetto 450 mg, twice daily.    [provider]  OVER THE COUNTER MEDICATION Vitamin D 3 One capsule daily.    [provider]  pseudoephedrine (SUDAFED) 30 MG tablet Take 2 tablets (60 mg total) by mouth every 4 (four) hours as needed for congestion (allergic reaction). Patient not taking: Reported on 07/12/2023 09/28/22   Plotnikov, Georgina Quint, MD  rosuvastatin (CRESTOR) 10 MG tablet Take 1 tablet (10 mg total) by mouth daily. 04/04/23   Plotnikov, Georgina Quint, MD  RSV vaccine recomb adjuvanted (AREXVY) 120 MCG/0.5ML injection Inject into the muscle. 12/21/22     Saw Palmetto 450 MG CAPS Take 450 mg by mouth in the morning and at bedtime.    [provider]  sildenafil (VIAGRA) 100 MG tablet TAKE 1 TABLET BY MOUTH EVERY DAY AS NEEDED FOR ERECTILE DYSFUNCTION 11/27/22  Plotnikov, Georgina Quint, MD  UNABLE TO FIND Med Name: Takes allergy shots weekly    [provider]    Family History Family History  Problem Relation Age of Onset   Cancer Mother        breast (hx of)   Other Mother        trigeminal neuralgia s/p gamma knife/ CHF   Hypertension Father    Diabetes Neg Hx    Colon cancer Neg Hx    Stomach cancer Neg Hx    Rectal cancer Neg Hx     Social History Social History   Tobacco Use   Smoking status: Former   Smokeless tobacco: Former    Types: Snuff, Chew    Quit date: 12/18/1985   Tobacco comments:    only for a short period in the 1980's  Vaping Use   Vaping status: Never Used  Substance Use Topics   Alcohol use: No    Comment: history of ETOH abuse   Drug use: No     Allergies   Symbicort [budesonide-formoterol fumarate] and Simvastatin   Review of Systems Review of Systems  Constitutional:  Positive for fever.   Per HPI  Physical Exam Triage Vital Signs ED Triage Vitals  Encounter Vitals Group     BP 02/28/24 1042 (!) 154/69     Systolic BP Percentile  --      Diastolic BP Percentile --      Pulse Rate 02/28/24 1042 94     Resp 02/28/24 1042 15     Temp 02/28/24 1042 (!) 101.2 F (38.4 C)     Temp Source 02/28/24 1042 Oral     SpO2 02/28/24 1042 96 %     Weight --      Height --      Head Circumference --      Peak Flow --      Pain Score 02/28/24 1041 4     Pain Loc --      Pain Education --      Exclude from Growth Chart --    No data found.  Updated Vital Signs BP (!) 154/69 (BP Location: Left Arm)   Pulse 94   Temp 98.8 F (37.1 C) (Oral)   Resp 15   SpO2 96%   Visual Acuity Right Eye Distance:   Left Eye Distance:   Bilateral Distance:    Right Eye Near:   Left Eye Near:    Bilateral Near:     Physical Exam Vitals and nursing note reviewed.  Constitutional:      Appearance: He is not ill-appearing.  HENT:     Nose: No rhinorrhea.     Mouth/Throat:     Mouth: Mucous membranes are moist.     Pharynx: Oropharynx is clear. No posterior oropharyngeal erythema.  Eyes:     Conjunctiva/sclera: Conjunctivae normal.  Cardiovascular:     Rate and Rhythm: Normal rate and regular rhythm.     Pulses: Normal pulses.     Heart sounds: Normal heart sounds.  Pulmonary:     Effort: Pulmonary effort is normal.     Breath sounds: Normal breath sounds. No wheezing or rales.  Musculoskeletal:     Cervical back: Normal range of motion.  Lymphadenopathy:     Cervical: No cervical adenopathy.  Skin:    General: Skin is warm and dry.  Neurological:     Mental Status: He is alert and oriented to person, place, and time.  UC Treatments / Results  Labs (all labs ordered are listed, but only abnormal results are displayed) Labs Reviewed  POC COVID19/FLU A&B COMBO - Abnormal; Notable for the following components:      Result Value   Influenza A Antigen, POC Positive (*)    All other components within normal limits    EKG   Radiology No results found.  Procedures Procedures (including critical care  time)  Medications Ordered in UC Medications  acetaminophen (TYLENOL) tablet 650 mg (650 mg Oral Given 02/28/24 1046)    Initial Impression / Assessment and Plan / UC Course  I have reviewed the triage vital signs and the nursing notes.  Pertinent labs & imaging results that were available during my care of the patient were reviewed by me and considered in my medical decision making (see chart for details).  Temp 101.2 on arrival.  Tylenol dose given. Fever resolved.  Rapid flu A is positive Offered tamiflu - discussed Other supportive care. Tessalon for cough if needed Return precautions discussed. Patient agrees to plan, no questions   Final Clinical Impressions(s) / UC Diagnoses   Final diagnoses:  Influenza A     Discharge Instructions      Continue tylenol 650 mg every 4-6 hours for fever and aches  The tessalon cough pills can be taken 3x daily. If this medication makes you drowsy, take only one pill before bed.  The tamiflu is the antiviral for flu. It can shorten the duration of your symptoms by one day. It will not treat your actual symptoms. Take twice daily for 5 days. If you develop side effects (nausea/vomiting/diarrhea) you can stop taking it.  Keep hydrated!      ED Prescriptions     Medication Sig Dispense Auth. Provider   benzonatate (TESSALON) 100 MG capsule Take 1 capsule (100 mg total) by mouth 3 (three) times daily as needed for cough. 30 capsule Saw Mendenhall, PA-C   oseltamivir (TAMIFLU) 75 MG capsule Take 1 capsule (75 mg total) by mouth every 12 (twelve) hours for 5 days. 10 capsule Maddoxx Burkitt, Lurena Joiner, PA-C      PDMP not reviewed this encounter.   Fredna Stricker, Lurena Joiner, [place] 02/28/24 1124

## 2024-02-28 NOTE — ED Triage Notes (Signed)
 Pt presents with c/o cough and a fever x 1 day. Pt states he has used Flonase this morning and Inhaler.  Pt is requesting COVID and Flu testing.

## 2024-02-28 NOTE — Discharge Instructions (Addendum)
 Continue tylenol 650 mg every 4-6 hours for fever and aches  The tessalon cough pills can be taken 3x daily. If this medication makes you drowsy, take only one pill before bed.  The tamiflu is the antiviral for flu. It can shorten the duration of your symptoms by one day. It will not treat your actual symptoms. Take twice daily for 5 days. If you develop side effects (nausea/vomiting/diarrhea) you can stop taking it.  Keep hydrated!

## 2024-02-29 ENCOUNTER — Other Ambulatory Visit: Payer: Self-pay | Admitting: Internal Medicine

## 2024-03-02 ENCOUNTER — Ambulatory Visit

## 2024-03-03 ENCOUNTER — Ambulatory Visit

## 2024-03-04 ENCOUNTER — Telehealth: Payer: Self-pay

## 2024-03-04 ENCOUNTER — Ambulatory Visit (INDEPENDENT_AMBULATORY_CARE_PROVIDER_SITE_OTHER)

## 2024-03-04 ENCOUNTER — Ambulatory Visit (INDEPENDENT_AMBULATORY_CARE_PROVIDER_SITE_OTHER): Admitting: Internal Medicine

## 2024-03-04 ENCOUNTER — Encounter: Payer: Self-pay | Admitting: Internal Medicine

## 2024-03-04 VITALS — BP 122/80 | HR 67 | Temp 98.9°F | Ht 70.0 in | Wt 193.0 lb

## 2024-03-04 DIAGNOSIS — R0602 Shortness of breath: Secondary | ICD-10-CM | POA: Diagnosis not present

## 2024-03-04 DIAGNOSIS — Z7984 Long term (current) use of oral hypoglycemic drugs: Secondary | ICD-10-CM | POA: Diagnosis not present

## 2024-03-04 DIAGNOSIS — J189 Pneumonia, unspecified organism: Secondary | ICD-10-CM | POA: Diagnosis not present

## 2024-03-04 DIAGNOSIS — R059 Cough, unspecified: Secondary | ICD-10-CM | POA: Diagnosis not present

## 2024-03-04 DIAGNOSIS — R051 Acute cough: Secondary | ICD-10-CM

## 2024-03-04 DIAGNOSIS — E1165 Type 2 diabetes mellitus with hyperglycemia: Secondary | ICD-10-CM | POA: Diagnosis not present

## 2024-03-04 DIAGNOSIS — J4520 Mild intermittent asthma, uncomplicated: Secondary | ICD-10-CM

## 2024-03-04 DIAGNOSIS — R079 Chest pain, unspecified: Secondary | ICD-10-CM | POA: Diagnosis not present

## 2024-03-04 MED ORDER — HYDROCOD POLI-CHLORPHE POLI ER 10-8 MG/5ML PO SUER: 5.0000 mL | Freq: Two times a day (BID) | ORAL | 0 refills | Status: DC | PRN

## 2024-03-04 MED ORDER — AMOXICILLIN-POT CLAVULANATE 875-125 MG PO TABS: 1.0000 | ORAL_TABLET | Freq: Two times a day (BID) | ORAL | 0 refills | Status: DC

## 2024-03-04 NOTE — Telephone Encounter (Signed)
 Okay.  Thanks.

## 2024-03-04 NOTE — Progress Notes (Signed)
 Subjective:  Patient ID: Brian Ayers, male    DOB: 1951-04-17  Age: 73 y.o. MRN: [id number]  CC: Cough (Pt tested POS for Flu A last week Thursday, cough and hoarseness )   HPI Brian Ayers presents for URI, cough - chronic, worse  Outpatient Medications Prior to Visit  Medication Sig Dispense Refill   albuterol (VENTOLIN HFA) 108 (90 Base) MCG/ACT inhaler Inhale into the lungs every 6 (six) hours as needed for wheezing or shortness of breath.     aspirin 81 MG chewable tablet Chew 81 mg by mouth daily.     azelastine  (ASTELIN ) 0.1 % nasal spray USE 1 SPRAY INTO EACH NOSTRIL TWICE A DAY     Biotin 2500 MCG CAPS Take 2,500 mcg by mouth daily.     Blood Glucose Monitoring Suppl (ONETOUCH VERIO FLEX SYSTEM) w/Device KIT USE AS DIRECTED TO CHECK BLOOD SUGARS 1 kit 0   clotrimazole -betamethasone  (LOTRISONE ) cream APPLY TO AFFECTED AREA TWICE A DAY 45 g 1   Continuous Blood Gluc Receiver (FREESTYLE LIBRE 3 READER) DEVI 1 Device by Does not apply route continuous. Use to monitor blood sugars 1 each 0   Continuous Glucose Sensor (FREESTYLE LIBRE 3 SENSOR) MISC PLACE 1 SENSOR ON THE SKIN EVERY 14 DAYS. USE TO CHECK GLUCOSE CONTINUOUSLY 6 each 3   dapagliflozin  propanediol (FARXIGA ) 10 MG TABS tablet Take 1 tablet (10 mg total) by mouth daily before breakfast. 90 tablet 3   diphenhydrAMINE  (BENADRYL  ALLERGY) 25 MG tablet Take 2 tablets (50 mg total) by mouth every 8 (eight) hours as needed for allergies (allergic reaction). 60 tablet 0   EPINEPHrine  0.3 mg/0.3 mL IJ SOAJ injection as needed.  0   famotidine  (PEPCID ) 20 MG tablet TAKE 1 TABLET BY MOUTH TWICE A DAY 180 tablet 3   fexofenadine (ALLEGRA) 180 MG tablet Take 180 mg by mouth daily.     Fluocinolone Acetonide Body 0.01 % OIL SMARTSIG:sparingly Topical 3 Times Daily PRN     fluticasone  (FLONASE) 50 MCG/ACT nasal spray      fluticasone -salmeterol (ADVAIR HFA) 115-21 MCG/ACT inhaler Inhale 2 puffs into the lungs 2 (two) times daily.      glucose blood (ONETOUCH VERIO) test strip Use to check blood sugar 1x daily 100 strip 5   irbesartan (AVAPRO) 150 MG tablet TAKE 1 TABLET BY MOUTH EVERY DAY 90 tablet 3   metFORMIN  (GLUCOPHAGE -XR) 500 MG 24 hr tablet TAKE 2 TABLETS BY MOUTH TWICE A DAY 360 tablet 1   OneTouch Delica Lancets 30G MISC USE TO CHECK BLOOD SUGER TWICE A DAY 100 each 5   oseltamivir  (TAMIFLU ) 75 MG capsule Take 1 capsule (75 mg total) by mouth every 12 (twelve) hours for 5 days. 10 capsule 0   OVER THE COUNTER MEDICATION Saw Palmetto 450 mg, twice daily.     OVER THE COUNTER MEDICATION Vitamin D  3 One capsule daily.     rosuvastatin  (CRESTOR ) 10 MG tablet Take 1 tablet (10 mg total) by mouth daily. 90 tablet 3   RSV vaccine recomb adjuvanted (AREXVY ) 120 MCG/0.5ML injection Inject into the muscle. 0.5 mL 0   Saw Palmetto 450 MG CAPS Take 450 mg by mouth in the morning and at bedtime.     sildenafil  (VIAGRA ) 100 MG tablet TAKE 1 TABLET BY MOUTH EVERY DAY AS NEEDED FOR ERECTILE DYSFUNCTION 12 tablet 11   UNABLE TO FIND Med Name: Takes allergy shots weekly     pseudoephedrine  (SUDAFED) 30 MG tablet Take 2 tablets (  60 mg total) by mouth every 4 (four) hours as needed for congestion (allergic reaction). (Patient not taking: Reported on 03/04/2024) 60 tablet 1   benzonatate  (TESSALON ) 100 MG capsule Take 1 capsule (100 mg total) by mouth 3 (three) times daily as needed for cough. (Patient not taking: Reported on 03/04/2024) 30 capsule 0   No facility-administered medications prior to visit.    ROS: Review of Systems  Constitutional:  Negative for appetite change, fatigue and unexpected weight change.  HENT:  Positive for congestion. Negative for nosebleeds, sneezing, sore throat and trouble swallowing.   Eyes:  Negative for itching and visual disturbance.  Respiratory:  Positive for cough and wheezing.   Cardiovascular:  Negative for chest pain, palpitations and leg swelling.  Gastrointestinal:  Negative for abdominal  distention, blood in stool, diarrhea and nausea.  Genitourinary:  Negative for frequency and hematuria.  Musculoskeletal:  Negative for back pain, gait problem, joint swelling and neck pain.  Skin:  Negative for rash.  Neurological:  Negative for dizziness, tremors, speech difficulty and weakness.  Psychiatric/Behavioral:  Negative for agitation, dysphoric mood and sleep disturbance. The patient is not nervous/anxious.     Objective:  BP 122/80   Pulse 67   Temp 98.9 F (37.2 C) (Oral)   Ht 5\' 10"  (1.778 m)   Wt 193 lb (87.5 kg)   SpO2 97%   BMI 27.69 kg/m   BP Readings from Last 3 Encounters:  03/04/24 122/80  02/28/24 (!) 154/69  01/09/24 130/80    Wt Readings from Last 3 Encounters:  03/04/24 193 lb (87.5 kg)  01/09/24 202 lb 6.4 oz (91.8 kg)  10/04/23 200 lb (90.7 kg)    Physical Exam Constitutional:      General: He is not in acute distress.    Appearance: He is well-developed.     Comments: NAD  Eyes:     Conjunctiva/sclera: Conjunctivae normal.     Pupils: Pupils are equal, round, and reactive to light.  Neck:     Thyroid : No thyromegaly.     Vascular: No JVD.  Cardiovascular:     Rate and Rhythm: Normal rate and regular rhythm.     Heart sounds: Normal heart sounds. No murmur heard.    No friction rub. No gallop.  Pulmonary:     Effort: Pulmonary effort is normal. No respiratory distress.     Breath sounds: Normal breath sounds. No wheezing or rales.  Chest:     Chest wall: No tenderness.  Abdominal:     General: Bowel sounds are normal. There is no distension.     Palpations: Abdomen is soft. There is no mass.     Tenderness: There is no abdominal tenderness. There is no guarding or rebound.  Musculoskeletal:        General: No tenderness. Normal range of motion.     Cervical back: Normal range of motion.  Lymphadenopathy:     Cervical: No cervical adenopathy.  Skin:    General: Skin is warm and dry.     Findings: No rash.  Neurological:      Mental Status: He is alert and oriented to person, place, and time.     Cranial Nerves: No cranial nerve deficit.     Motor: No abnormal muscle tone.     Coordination: Coordination normal.     Gait: Gait normal.     Deep Tendon Reflexes: Reflexes are normal and symmetric.  Psychiatric:        Behavior: Behavior normal.  Thought Content: Thought content normal.        Judgment: Judgment normal.   The patient appears tired, coughing in the office L ronchi Lab Results  Component Value Date   WBC 6.0 10/03/2023   HGB 14.3 10/03/2023   HCT 43.4 10/03/2023   PLT 253.0 10/03/2023   GLUCOSE 145 (H) 10/03/2023   CHOL 154 10/03/2023   TRIG 80.0 10/03/2023   HDL 58.30 10/03/2023   LDLCALC 80 10/03/2023   ALT 22 10/03/2023   AST 18 10/03/2023   NA 137 10/03/2023   K 4.5 10/03/2023   CL 105 10/03/2023   CREATININE 0.91 10/03/2023   BUN 20 10/03/2023   CO2 25 10/03/2023   TSH 3.28 10/03/2023   PSA 0.77 10/03/2023   HGBA1C 7.2 (A) 01/09/2024   MICROALBUR 4.7 (H) 10/03/2023    No results found.  Assessment & Plan:   Problem List Items Addressed This Visit     Diabetes type 2, controlled (HCC)   Continue with current treatment plan F/u w/Dr Shamleffer On Metformin , Farxiga  10 mg/d On Libre 3      Asthma, chronic   Worse due to pneumonia.  Continue to use the inhaler      CAP (community acquired pneumonia)   Clinically suspect pneumonia.  Obtain chest x-ray.  Prescribed Augmentin .  Tussionex cough syrup as needed      Relevant Medications   amoxicillin -clavulanate (AUGMENTIN ) 875-125 MG tablet   Other Visit Diagnoses       Acute cough    -  Primary   Relevant Orders   DG Chest 2 View (Completed)         Meds ordered this encounter  Medications   amoxicillin -clavulanate (AUGMENTIN ) 875-125 MG tablet    Sig: Take 1 tablet by mouth 2 (two) times daily.    Dispense:  20 tablet    Refill:  0   DISCONTD: chlorpheniramine-HYDROcodone  (TUSSIONEX) 10-8 MG/5ML     Sig: Take 5 mLs by mouth every 12 (twelve) hours as needed for cough.    Dispense:  115 mL    Refill:  0      Follow-up: No follow-ups on file.  Anitra Barn, MD

## 2024-03-04 NOTE — Telephone Encounter (Signed)
 Copied from CRM [id number]. Topic: Clinical - Prescription Issue >> Mar 04, 2024  2:51 PM Melissa C wrote: Reason for CRM: chlorpheniramine-HYDROcodone (TUSSIONEX) 10-8 MG/5ML [facility] but they are [facility] until Thursday and patient is in need of it tonight. Patient/Patient's wife asked that it be sent to: [facility]  [address] in [place] (only information patient was able to provide) Please contact patient's wife once sent so they know that it was sent.

## 2024-03-06 ENCOUNTER — Ambulatory Visit: Admitting: Internal Medicine

## 2024-03-11 ENCOUNTER — Ambulatory Visit: Admitting: Internal Medicine

## 2024-03-13 ENCOUNTER — Ambulatory Visit: Admitting: Internal Medicine

## 2024-03-14 DIAGNOSIS — J3089 Other allergic rhinitis: Secondary | ICD-10-CM | POA: Diagnosis not present

## 2024-03-14 DIAGNOSIS — J301 Allergic rhinitis due to pollen: Secondary | ICD-10-CM | POA: Diagnosis not present

## 2024-03-14 DIAGNOSIS — J3081 Allergic rhinitis due to animal (cat) (dog) hair and dander: Secondary | ICD-10-CM | POA: Diagnosis not present

## 2024-03-15 ENCOUNTER — Other Ambulatory Visit: Payer: Self-pay | Admitting: Internal Medicine

## 2024-03-17 ENCOUNTER — Other Ambulatory Visit: Payer: Self-pay | Admitting: Internal Medicine

## 2024-03-17 NOTE — Telephone Encounter (Unsigned)
 Copied from CRM [id number]. Topic: Clinical - Medication Refill >> Mar 17, 2024  4:17 PM Truddie Crumble wrote: Most Recent Primary Care Visit:  Provider: Tresa Garter  Department: [facility]  Visit Type: OFFICE VISIT  Date: 03/04/2024  Medication: chlorpheniramine-HYDROcodone (TUSSIONEX) 10-8 MG/5ML  Has the patient contacted their pharmacy? Yes (Agent: If no, request that the patient contact the pharmacy for the refill. If patient does not wish to contact the pharmacy document the reason why and proceed with request.) (Agent: If yes, when and what did the pharmacy advise?)  Is this the correct pharmacy for this prescription? Yes If no, delete pharmacy and type the correct one.  This is the patient's preferred pharmacy:   [id number] - [place], [place] - [address] [place] [address] Phone: [phone] Fax: [phone]   Has the prescription been filled recently? Yes  Is the patient out of the medication? Yes  Has the patient been seen for an appointment in the last year OR does the patient have an upcoming appointment? Yes  Can we respond through MyChart? Yes  Agent: Please be advised that Rx refills may take up to 3 business days. We ask that you follow-up with your pharmacy.

## 2024-03-19 ENCOUNTER — Ambulatory Visit: Admitting: Internal Medicine

## 2024-03-19 DIAGNOSIS — J3089 Other allergic rhinitis: Secondary | ICD-10-CM | POA: Diagnosis not present

## 2024-03-19 DIAGNOSIS — J3081 Allergic rhinitis due to animal (cat) (dog) hair and dander: Secondary | ICD-10-CM | POA: Diagnosis not present

## 2024-03-19 DIAGNOSIS — J301 Allergic rhinitis due to pollen: Secondary | ICD-10-CM | POA: Diagnosis not present

## 2024-03-19 MED ORDER — HYDROCOD POLI-CHLORPHE POLI ER 10-8 MG/5ML PO SUER: 5.0000 mL | Freq: Two times a day (BID) | ORAL | 0 refills | Status: DC | PRN

## 2024-03-20 DIAGNOSIS — H2513 Age-related nuclear cataract, bilateral: Secondary | ICD-10-CM | POA: Diagnosis not present

## 2024-03-21 MED ORDER — PROMETHAZINE-DM 6.25-15 MG/5ML PO SYRP
5.0000 mL | ORAL_SOLUTION | Freq: Four times a day (QID) | ORAL | 0 refills | Status: DC | PRN
Start: 2024-03-21 — End: 2024-07-11

## 2024-03-26 DIAGNOSIS — J301 Allergic rhinitis due to pollen: Secondary | ICD-10-CM | POA: Diagnosis not present

## 2024-03-26 DIAGNOSIS — J3081 Allergic rhinitis due to animal (cat) (dog) hair and dander: Secondary | ICD-10-CM | POA: Diagnosis not present

## 2024-03-26 DIAGNOSIS — J3089 Other allergic rhinitis: Secondary | ICD-10-CM | POA: Diagnosis not present

## 2024-04-02 DIAGNOSIS — J3089 Other allergic rhinitis: Secondary | ICD-10-CM | POA: Diagnosis not present

## 2024-04-02 DIAGNOSIS — J3081 Allergic rhinitis due to animal (cat) (dog) hair and dander: Secondary | ICD-10-CM | POA: Diagnosis not present

## 2024-04-02 DIAGNOSIS — J301 Allergic rhinitis due to pollen: Secondary | ICD-10-CM | POA: Diagnosis not present

## 2024-04-03 ENCOUNTER — Ambulatory Visit: Payer: Medicare Other | Admitting: Internal Medicine

## 2024-04-06 ENCOUNTER — Encounter: Payer: Self-pay | Admitting: Internal Medicine

## 2024-04-06 DIAGNOSIS — J189 Pneumonia, unspecified organism: Secondary | ICD-10-CM | POA: Insufficient documentation

## 2024-04-06 NOTE — Assessment & Plan Note (Signed)
 Continue with current treatment plan F/u w/Dr Shamleffer On Metformin , Farxiga  10 mg/d On Libre 3

## 2024-04-06 NOTE — Assessment & Plan Note (Signed)
 Clinically suspect pneumonia.  Obtain chest x-ray.  Prescribed Augmentin .  Tussionex cough syrup as needed

## 2024-04-06 NOTE — Assessment & Plan Note (Signed)
 Worse due to pneumonia.  Continue to use the inhaler

## 2024-04-09 DIAGNOSIS — L814 Other melanin hyperpigmentation: Secondary | ICD-10-CM | POA: Diagnosis not present

## 2024-04-09 DIAGNOSIS — D2272 Melanocytic nevi of left lower limb, including hip: Secondary | ICD-10-CM | POA: Diagnosis not present

## 2024-04-09 DIAGNOSIS — D1801 Hemangioma of skin and subcutaneous tissue: Secondary | ICD-10-CM | POA: Diagnosis not present

## 2024-04-09 DIAGNOSIS — J301 Allergic rhinitis due to pollen: Secondary | ICD-10-CM | POA: Diagnosis not present

## 2024-04-09 DIAGNOSIS — L570 Actinic keratosis: Secondary | ICD-10-CM | POA: Diagnosis not present

## 2024-04-09 DIAGNOSIS — Z85828 Personal history of other malignant neoplasm of skin: Secondary | ICD-10-CM | POA: Diagnosis not present

## 2024-04-09 DIAGNOSIS — J3081 Allergic rhinitis due to animal (cat) (dog) hair and dander: Secondary | ICD-10-CM | POA: Diagnosis not present

## 2024-04-09 DIAGNOSIS — J3089 Other allergic rhinitis: Secondary | ICD-10-CM | POA: Diagnosis not present

## 2024-04-09 DIAGNOSIS — D225 Melanocytic nevi of trunk: Secondary | ICD-10-CM | POA: Diagnosis not present

## 2024-04-09 DIAGNOSIS — L578 Other skin changes due to chronic exposure to nonionizing radiation: Secondary | ICD-10-CM | POA: Diagnosis not present

## 2024-04-10 ENCOUNTER — Encounter: Payer: Self-pay | Admitting: Internal Medicine

## 2024-04-10 ENCOUNTER — Other Ambulatory Visit: Payer: Self-pay

## 2024-04-10 MED ORDER — DAPAGLIFLOZIN PROPANEDIOL 10 MG PO TABS: 10.0000 mg | ORAL_TABLET | Freq: Every day | ORAL | 1 refills | Status: DC

## 2024-04-14 ENCOUNTER — Encounter: Payer: Self-pay | Admitting: Internal Medicine

## 2024-04-14 ENCOUNTER — Ambulatory Visit (INDEPENDENT_AMBULATORY_CARE_PROVIDER_SITE_OTHER): Payer: Medicare Other | Admitting: Internal Medicine

## 2024-04-14 VITALS — BP 120/80 | HR 85 | Ht 70.0 in | Wt 188.8 lb

## 2024-04-14 DIAGNOSIS — E119 Type 2 diabetes mellitus without complications: Secondary | ICD-10-CM

## 2024-04-14 DIAGNOSIS — Z7984 Long term (current) use of oral hypoglycemic drugs: Secondary | ICD-10-CM

## 2024-04-14 LAB — POCT GLYCOSYLATED HEMOGLOBIN (HGB A1C): Hemoglobin A1C: 6.6 % — AB (ref 4.0–5.6)

## 2024-04-14 NOTE — Progress Notes (Signed)
 Name: Brian Ayers  Age/ Sex: 73 y.o., male   MRN/ DOB: [id number], February 23, 1951     PCP: Genia Kettering, MD   Reason for Endocrinology Evaluation: Type 2 Diabetes Mellitus  Initial Endocrine Consultative Visit: 02/07/2021    PATIENT IDENTIFIER: [name] is a 73 y.o. male with a past medical history of  T2DM, Asthma and Dyslipidemia, with Hx of alcohol abuse. The patient has followed with Endocrinology clinic since 02/07/2021 for consultative assistance with management of his diabetes.  DIABETIC HISTORY:  Brian Ayers was diagnosed with DM in 2015. He has been on Metformin , repaglinide  started 2/20222  His hemoglobin A1c has ranged from 6.7% in 2019, peaking at 7.6% in 2022.   On his intial visit he had an A1c of 7.6 %. He was just started on Repaglinide  so we continued this until 04/2021 due to recurrent hypoglycemia. We increased Metformin     Restarted Repaglinide  in 06/2021 due to hyperglycemia with Bg's in 200's. But this was discontinued in 08/2021 and started on Farxiga   with an A1c 6.3 %   SUBJECTIVE:   During the last visit (01/09/2024): A1c 7.2 %     Today (04/14/2024): [name] is here for a follow up on diabetes management.  He checks his blood sugars multiple  times daily, through CGM. The patient has had hypoglycemic episodes, unclear the accuracy of freestyle libre.   Patient continues to follow-up with allergy and immunology for allergic rhinitis  He was diagnosed with influenza 02/2024 He has been noted with weight loss  Denies nausea, vomiting Denies diarrhea  but has noted hardening of the stools     HOME DIABETES REGIMEN:  Metformin  500 mg 2 tabs BID  Farxiga  10 mg daily     Statin: yes ACE-I/ARB: yes     CONTINUOUS GLUCOSE MONITORING RECORD INTERPRETATION    Dates of Recording: 4/15-4/28/2025  Sensor description:freestyle libre  Results statistics:   CGM use % of time 96  Average and SD 125/21.6  Time in range 96%  %  Time Above 180 4  % Time above 250 0  % Time Below target 0   Glycemic patterns summary: BGs are optimal overnight and most of the day  Hyperglycemic episodes at postprandial   Hypoglycemic episodes occurred hypoglycemia noted overnight  Overnight periods: Trends down occasionally     DIABETIC COMPLICATIONS: Microvascular complications:  Posterior vitreous detachment but no DR Denies: CKD, neuropathy  Last Eye Exam: Completed 07/11/2023  Macrovascular complications:   Denies: CAD, CVA, PVD   HISTORY:  Past Medical History:  Past Medical History:  Diagnosis Date   Alcohol abuse    hixtory of   Allergy    Asthma childhood   Cancer (HCC)    skin cancer on face X1   Cataract    Chronic kidney disease    Diabetes mellitus without complication (HCC)    GERD (gastroesophageal reflux disease)    History of kidney stones    Hx of colonic polyps    Hyperlipidemia    Rhinitis    on allery shots   Past Surgical History:  Past Surgical History:  Procedure Laterality Date   CATARACT EXTRACTION Left 2020   COLONOSCOPY  03/04/2019   Dr.Perry   EXTRACORPOREAL SHOCK WAVE LITHOTRIPSY Right 03/14/2018   Procedure: RIGHT EXTRACORPOREAL SHOCK WAVE LITHOTRIPSY (ESWL);  Surgeon: Florencio Hunting, MD;  Location: [facility];  Service: Urology;  Laterality: Right;   hernia with hydrocele  1973   left shoulder pin setting  1978   TONSILLECTOMY  1967   WISDOM TOOTH EXTRACTION  2004   Social History:  reports that he has quit smoking. He quit smokeless tobacco use about 38 years ago.  His smokeless tobacco use included snuff and chew. He reports that he does not drink alcohol and does not use drugs. Family History:  Family History  Problem Relation Age of Onset   Cancer Mother        breast (hx of)   Other Mother        trigeminal neuralgia s/p gamma knife/ CHF   Hypertension Father    Diabetes Neg Hx    Colon cancer Neg Hx    Stomach cancer Neg Hx    Rectal cancer Neg Hx       HOME MEDICATIONS: Allergies as of 04/14/2024       Reactions   Symbicort [budesonide-formoterol Fumarate] Anaphylaxis   Anaphylactic reaction to Symbicort vs other (???) after 10 days of use. Went to ER w/rash/SOB/swollen lips.Using Advair ok   Simvastatin     GERD        Medication List        Accurate as of April 14, 2024 11:42 AM. If you have any questions, ask your nurse or doctor.          amoxicillin -clavulanate 875-125 MG tablet Commonly known as: AUGMENTIN  Take 1 tablet by mouth 2 (two) times daily.   Arexvy  120 MCG/0.5ML injection Generic drug: RSV vaccine recomb adjuvanted Inject into the muscle.   aspirin 81 MG chewable tablet Chew 81 mg by mouth daily.   azelastine  0.1 % nasal spray Commonly known as: ASTELIN  USE 1 SPRAY INTO EACH NOSTRIL TWICE A DAY   Biotin 2500 MCG Caps Take 2,500 mcg by mouth daily.   clotrimazole -betamethasone  cream Commonly known as: LOTRISONE  APPLY TO AFFECTED AREA TWICE A DAY   dapagliflozin  propanediol 10 MG Tabs tablet Commonly known as: Farxiga  Take 1 tablet (10 mg total) by mouth daily before breakfast.   diphenhydrAMINE  25 MG tablet Commonly known as: Benadryl  Allergy Take 2 tablets (50 mg total) by mouth every 8 (eight) hours as needed for allergies (allergic reaction).   EPINEPHrine  0.3 mg/0.3 mL Soaj injection Commonly known as: EPI-PEN as needed.   famotidine  20 MG tablet Commonly known as: PEPCID  TAKE 1 TABLET BY MOUTH TWICE A DAY   fexofenadine 180 MG tablet Commonly known as: ALLEGRA Take 180 mg by mouth daily.   Fluocinolone Acetonide Body 0.01 % Oil SMARTSIG:sparingly Topical 3 Times Daily PRN   fluticasone  50 MCG/ACT nasal spray Commonly known as: FLONASE   fluticasone -salmeterol 115-21 MCG/ACT inhaler Commonly known as: ADVAIR HFA Inhale 2 puffs into the lungs 2 (two) times daily.   FreeStyle Libre 3 Reader Devi 1 Device by Does not apply route continuous. Use to monitor blood  sugars   FreeStyle Libre 3 Sensor Misc PLACE 1 SENSOR ON THE SKIN EVERY 14 DAYS. USE TO CHECK GLUCOSE CONTINUOUSLY   irbesartan 150 MG tablet Commonly known as: AVAPRO TAKE 1 TABLET BY MOUTH EVERY DAY   metFORMIN  500 MG 24 hr tablet Commonly known as: GLUCOPHAGE -XR TAKE 2 TABLETS BY MOUTH TWICE A DAY   OneTouch Delica Lancets 30G Misc USE TO CHECK BLOOD SUGER TWICE A DAY   OneTouch Verio Flex System w/Device Kit USE AS DIRECTED TO CHECK BLOOD SUGARS   OneTouch Verio test strip Generic drug: glucose blood Use to check blood sugar 1x daily   OVER THE COUNTER MEDICATION Saw Palmetto 450 mg, twice  daily.   OVER THE COUNTER MEDICATION Vitamin D  3 One capsule daily.   promethazine -dextromethorphan 6.25-15 MG/5ML syrup Commonly known as: PROMETHAZINE -DM Take 5 mLs by mouth 4 (four) times daily as needed.   pseudoephedrine  30 MG tablet Commonly known as: Sudafed Take 2 tablets (60 mg total) by mouth every 4 (four) hours as needed for congestion (allergic reaction).   rosuvastatin  10 MG tablet Commonly known as: CRESTOR  Take 1 tablet (10 mg total) by mouth daily.   Saw Palmetto 450 MG Caps Take 450 mg by mouth in the morning and at bedtime.   sildenafil  100 MG tablet Commonly known as: VIAGRA  TAKE 1 TABLET BY MOUTH EVERY DAY AS NEEDED FOR ERECTILE DYSFUNCTION   UNABLE TO FIND Med Name: Takes allergy shots weekly   Ventolin HFA 108 (90 Base) MCG/ACT inhaler Generic drug: albuterol Inhale into the lungs every 6 (six) hours as needed for wheezing or shortness of breath.         OBJECTIVE:   Vital Signs: BP 120/80 (BP Location: Left Arm, Patient Position: Sitting, Cuff Size: Normal)   Pulse 85   Ht 5\' 10"  (1.778 m)   Wt 188 lb 12.8 oz (85.6 kg)   SpO2 99%   BMI 27.09 kg/m   Wt Readings from Last 3 Encounters:  04/14/24 188 lb 12.8 oz (85.6 kg)  03/04/24 193 lb (87.5 kg)  01/09/24 202 lb 6.4 oz (91.8 kg)     Exam: General: Pt appears well and is in NAD   Lungs: Clear with good BS bilat   Heart: RRR    Abdomen: Soft, nontender  Extremities: No pretibial edema.   Neuro: MS is good with appropriate affect, pt is alert and Ox3    DM foot exam:  01/09/2024  The skin of the feet is without sores or ulcerations, patient with bilateral plantar callus formation The pedal pulses are 2+ on right and 2+ on left. The sensation is intact to a screening 5.07, 10 gram monofilament bilaterally    DATA REVIEWED:  Lab Results  Component Value Date   HGBA1C 6.6 (A) 04/14/2024   HGBA1C 7.2 (A) 01/09/2024   HGBA1C 7.4 (H) 10/03/2023     Latest Reference Range & Units 10/03/23 08:08  Sodium 135 - 145 mEq/L 137  Potassium 3.5 - 5.1 mEq/L 4.5  Chloride 96 - 112 mEq/L 105  CO2 19 - 32 mEq/L 25  Glucose 70 - 99 mg/dL 161 (H)  BUN 6 - 23 mg/dL 20  Creatinine 0.96 - 0.45 mg/dL 4.09  Calcium  8.4 - 10.5 mg/dL 9.1  Alkaline Phosphatase 39 - 117 U/L 59  Albumin 3.5 - 5.2 g/dL 4.1  AST 0 - 37 U/L 18  ALT 0 - 53 U/L 22  Total Protein 6.0 - 8.3 g/dL 6.0  Total Bilirubin 0.2 - 1.2 mg/dL 0.9  GFR >81.19 mL/min 85.49    Latest Reference Range & Units 10/03/23 08:08  Total CHOL/HDL Ratio  3  Cholesterol 0 - 200 mg/dL 147  HDL Cholesterol >82.95 mg/dL 62.13  LDL (calc) 0 - 99 mg/dL 80  MICROALB/CREAT RATIO 0.0 - 30.0 mg/g 6.3  NonHDL  95.71  Triglycerides 0.0 - 149.0 mg/dL 08.6  VLDL 0.0 - 57.8 mg/dL 46.9       ASSESSMENT / PLAN / RECOMMENDATIONS:   1) Type 2 Diabetes Mellitus, Optimally controlled, Without complications - Most recent A1c of 6.6%. Goal A1c < 7.0 %.     -A1c has optimized -The patient has reduced his carbohydrate intake,  I did encourage the patient that he could increase his lean protein as well as vegetables so he is not as hungry -Historically repaglinide  has caused hypoglycemia -No changes at this time  MEDICATIONS: Continue  Metformin  500 mg, Two tablets with Breakfast and two tablets with Supper Continue  Farxiga  10 mg  , 1 tablet daily    EDUCATION / INSTRUCTIONS: BG monitoring instructions: Patient is instructed to check his blood sugars 3 times a day, before meals . Call [facility] if: BG persistently < 70  I reviewed the Rule of 15 for the treatment of hypoglycemia in detail with the patient. Literature supplied.    2) Diabetic complications:  Eye: Does not have known diabetic retinopathy.  Neuro/ Feet: Does not have known diabetic peripheral neuropathy .  Renal: Patient does not have known baseline CKD. He   is  on an ACEI/ARB at present.     F/U in 4 months     Signed electronically by: Natale Bail, MD  [facility]  [facility] [address] [place], [place] [address] Phone: [phone] FAX: [phone]   CC: Genia Kettering, MD [address] [place] [address] Phone: [phone]  Fax: [phone]  Return to Endocrinology clinic as below: Future Appointments  Date Time Provider Department Center  04/14/2024 11:50 AM Tayvon Culley, Julian Obey, MD [facility] None  07/03/2024  3:40 PM Plotnikov, Oakley Bellman, MD [facility] None

## 2024-04-14 NOTE — Patient Instructions (Addendum)
-   Continue  Metformin 500 mg, Two tablets with Breakfast and two tablets with Supper - Continue Farxiga 10  mg, 1 tablet daily     HOW TO TREAT LOW BLOOD SUGARS (Blood sugar LESS THAN 70 MG/DL) Please follow the RULE OF 15 for the treatment of hypoglycemia treatment (when your (blood sugars are less than 70 mg/dL)   STEP 1: Take 15 grams of carbohydrates when your blood sugar is low, which includes:  3-4 GLUCOSE TABS  OR 3-4 OZ OF JUICE OR REGULAR SODA OR ONE TUBE OF GLUCOSE GEL    STEP 2: RECHECK blood sugar in 15 MINUTES STEP 3: If your blood sugar is still low at the 15 minute recheck --> then, go back to STEP 1 and treat AGAIN with another 15 grams of carbohydrates.

## 2024-04-16 DIAGNOSIS — J3089 Other allergic rhinitis: Secondary | ICD-10-CM | POA: Diagnosis not present

## 2024-04-16 DIAGNOSIS — J3081 Allergic rhinitis due to animal (cat) (dog) hair and dander: Secondary | ICD-10-CM | POA: Diagnosis not present

## 2024-04-16 DIAGNOSIS — J301 Allergic rhinitis due to pollen: Secondary | ICD-10-CM | POA: Diagnosis not present

## 2024-04-17 ENCOUNTER — Other Ambulatory Visit: Payer: Self-pay | Admitting: Internal Medicine

## 2024-04-23 DIAGNOSIS — J301 Allergic rhinitis due to pollen: Secondary | ICD-10-CM | POA: Diagnosis not present

## 2024-04-23 DIAGNOSIS — J3081 Allergic rhinitis due to animal (cat) (dog) hair and dander: Secondary | ICD-10-CM | POA: Diagnosis not present

## 2024-04-23 DIAGNOSIS — J3089 Other allergic rhinitis: Secondary | ICD-10-CM | POA: Diagnosis not present

## 2024-04-24 ENCOUNTER — Other Ambulatory Visit: Payer: Self-pay | Admitting: Internal Medicine

## 2024-04-30 DIAGNOSIS — J3081 Allergic rhinitis due to animal (cat) (dog) hair and dander: Secondary | ICD-10-CM | POA: Diagnosis not present

## 2024-04-30 DIAGNOSIS — J301 Allergic rhinitis due to pollen: Secondary | ICD-10-CM | POA: Diagnosis not present

## 2024-04-30 DIAGNOSIS — J3089 Other allergic rhinitis: Secondary | ICD-10-CM | POA: Diagnosis not present

## 2024-05-07 DIAGNOSIS — J3089 Other allergic rhinitis: Secondary | ICD-10-CM | POA: Diagnosis not present

## 2024-05-07 DIAGNOSIS — J301 Allergic rhinitis due to pollen: Secondary | ICD-10-CM | POA: Diagnosis not present

## 2024-05-07 DIAGNOSIS — J3081 Allergic rhinitis due to animal (cat) (dog) hair and dander: Secondary | ICD-10-CM | POA: Diagnosis not present

## 2024-05-14 DIAGNOSIS — J301 Allergic rhinitis due to pollen: Secondary | ICD-10-CM | POA: Diagnosis not present

## 2024-05-14 DIAGNOSIS — J3089 Other allergic rhinitis: Secondary | ICD-10-CM | POA: Diagnosis not present

## 2024-05-14 DIAGNOSIS — J3081 Allergic rhinitis due to animal (cat) (dog) hair and dander: Secondary | ICD-10-CM | POA: Diagnosis not present

## 2024-05-21 DIAGNOSIS — J301 Allergic rhinitis due to pollen: Secondary | ICD-10-CM | POA: Diagnosis not present

## 2024-05-21 DIAGNOSIS — J3081 Allergic rhinitis due to animal (cat) (dog) hair and dander: Secondary | ICD-10-CM | POA: Diagnosis not present

## 2024-05-21 DIAGNOSIS — J3089 Other allergic rhinitis: Secondary | ICD-10-CM | POA: Diagnosis not present

## 2024-05-22 ENCOUNTER — Encounter: Payer: Self-pay | Admitting: Internal Medicine

## 2024-05-24 ENCOUNTER — Other Ambulatory Visit: Payer: Self-pay | Admitting: Internal Medicine

## 2024-05-28 DIAGNOSIS — J3089 Other allergic rhinitis: Secondary | ICD-10-CM | POA: Diagnosis not present

## 2024-05-28 DIAGNOSIS — J3081 Allergic rhinitis due to animal (cat) (dog) hair and dander: Secondary | ICD-10-CM | POA: Diagnosis not present

## 2024-05-28 DIAGNOSIS — J301 Allergic rhinitis due to pollen: Secondary | ICD-10-CM | POA: Diagnosis not present

## 2024-06-04 DIAGNOSIS — J301 Allergic rhinitis due to pollen: Secondary | ICD-10-CM | POA: Diagnosis not present

## 2024-06-04 DIAGNOSIS — J3089 Other allergic rhinitis: Secondary | ICD-10-CM | POA: Diagnosis not present

## 2024-06-04 DIAGNOSIS — J3081 Allergic rhinitis due to animal (cat) (dog) hair and dander: Secondary | ICD-10-CM | POA: Diagnosis not present

## 2024-06-11 DIAGNOSIS — J3089 Other allergic rhinitis: Secondary | ICD-10-CM | POA: Diagnosis not present

## 2024-06-11 DIAGNOSIS — J3081 Allergic rhinitis due to animal (cat) (dog) hair and dander: Secondary | ICD-10-CM | POA: Diagnosis not present

## 2024-06-11 DIAGNOSIS — J301 Allergic rhinitis due to pollen: Secondary | ICD-10-CM | POA: Diagnosis not present

## 2024-06-12 ENCOUNTER — Other Ambulatory Visit: Payer: Self-pay | Admitting: Internal Medicine

## 2024-06-16 ENCOUNTER — Ambulatory Visit

## 2024-06-18 DIAGNOSIS — J3081 Allergic rhinitis due to animal (cat) (dog) hair and dander: Secondary | ICD-10-CM | POA: Diagnosis not present

## 2024-06-18 DIAGNOSIS — J301 Allergic rhinitis due to pollen: Secondary | ICD-10-CM | POA: Diagnosis not present

## 2024-06-18 DIAGNOSIS — J3089 Other allergic rhinitis: Secondary | ICD-10-CM | POA: Diagnosis not present

## 2024-06-25 DIAGNOSIS — J3089 Other allergic rhinitis: Secondary | ICD-10-CM | POA: Diagnosis not present

## 2024-06-25 DIAGNOSIS — J3081 Allergic rhinitis due to animal (cat) (dog) hair and dander: Secondary | ICD-10-CM | POA: Diagnosis not present

## 2024-06-25 DIAGNOSIS — J301 Allergic rhinitis due to pollen: Secondary | ICD-10-CM | POA: Diagnosis not present

## 2024-07-02 DIAGNOSIS — J301 Allergic rhinitis due to pollen: Secondary | ICD-10-CM | POA: Diagnosis not present

## 2024-07-02 DIAGNOSIS — J3089 Other allergic rhinitis: Secondary | ICD-10-CM | POA: Diagnosis not present

## 2024-07-02 DIAGNOSIS — J3081 Allergic rhinitis due to animal (cat) (dog) hair and dander: Secondary | ICD-10-CM | POA: Diagnosis not present

## 2024-07-03 ENCOUNTER — Ambulatory Visit: Payer: Medicare Other | Admitting: Internal Medicine

## 2024-07-09 DIAGNOSIS — J301 Allergic rhinitis due to pollen: Secondary | ICD-10-CM | POA: Diagnosis not present

## 2024-07-09 DIAGNOSIS — J3081 Allergic rhinitis due to animal (cat) (dog) hair and dander: Secondary | ICD-10-CM | POA: Diagnosis not present

## 2024-07-09 DIAGNOSIS — J3089 Other allergic rhinitis: Secondary | ICD-10-CM | POA: Diagnosis not present

## 2024-07-11 ENCOUNTER — Ambulatory Visit
Admission: RE | Admit: 2024-07-11 | Discharge: 2024-07-11 | Disposition: A | Discharge status: Home / Self Care | Source: Ambulatory Visit | Attending: Family Medicine | Admitting: Family Medicine

## 2024-07-11 ENCOUNTER — Ambulatory Visit

## 2024-07-11 VITALS — BP 162/93 | HR 85 | Temp 97.8°F | Resp 19

## 2024-07-11 VITALS — Ht 70.0 in | Wt 188.0 lb

## 2024-07-11 DIAGNOSIS — R059 Cough, unspecified: Secondary | ICD-10-CM | POA: Diagnosis not present

## 2024-07-11 DIAGNOSIS — Z0000 Encounter for general adult medical examination without abnormal findings: Secondary | ICD-10-CM

## 2024-07-11 DIAGNOSIS — E1165 Type 2 diabetes mellitus with hyperglycemia: Secondary | ICD-10-CM | POA: Diagnosis not present

## 2024-07-11 DIAGNOSIS — U071 COVID-19: Secondary | ICD-10-CM | POA: Diagnosis not present

## 2024-07-11 MED ORDER — PAXLOVID (300/100) 20 X 150 MG & 10 X 100MG PO TBPK: 3.0000 | ORAL_TABLET | Freq: Two times a day (BID) | ORAL | 0 refills | Status: AC

## 2024-07-11 MED ORDER — PROMETHAZINE-DM 6.25-15 MG/5ML PO SYRP: 5.0000 mL | ORAL_SOLUTION | Freq: Two times a day (BID) | ORAL | 0 refills | Status: DC | PRN

## 2024-07-11 NOTE — Progress Notes (Signed)
 Subjective:  Please attest and cosign this visit due to patients primary care provider not being in the office at the time the visit was completed.  (Pt of Dr A. Plotnikov)   Brian Ayers is a 73 y.o. who presents for a Medicare Wellness preventive visit.  As a reminder, Annual Wellness Visits don't include a physical exam, and some assessments may be limited, especially if this visit is performed virtually. We may recommend an in-person follow-up visit with your provider if needed.  Visit Complete: Virtual I connected with  Brian Ayers on 07/11/24 by a audio enabled telemedicine application and verified that I am speaking with the correct person using two identifiers.  Patient Location: Home  Provider Location: Office/Clinic  I discussed the limitations of evaluation and management by telemedicine. The patient expressed understanding and agreed to proceed.  Vital Signs: Because this visit was a virtual/telehealth visit, some criteria may be missing or patient reported. Any vitals not documented were not able to be obtained and vitals that have been documented are patient reported.  VideoDeclined- This patient declined Librarian, academic. Therefore the visit was completed with audio only.  Persons Participating in Visit: Patient.  AWV Questionnaire: Yes: Patient Medicare AWV questionnaire was completed by the patient on 07/04/2024; I have confirmed that all information answered by patient is correct and no changes since this date.  Cardiac Risk Factors include: advanced age (>13men, >53 women);diabetes mellitus;dyslipidemia;hypertension;male gender     Objective:    Today's Vitals   07/11/24 1505  Weight: 188 lb (85.3 kg)  Height: 5' 10 (1.778 m)   Body mass index is 26.98 kg/m.     07/11/2024    3:05 PM 06/15/2023   11:26 AM 06/12/2022    3:23 PM 04/14/2022    9:01 AM 05/31/2021    4:21 PM 03/17/2021    2:24 PM 07/15/2018    2:49 PM   Advanced Directives  Does Patient Have a Medical Advance Directive? Yes Yes Yes No Yes Yes Yes   Type of Estate agent of [place];Living will Healthcare Power of [place];Living will Healthcare Power of [place];Living will  Living will;Healthcare Power of Attorney  Living will  Does patient want to make changes to medical advance directive?     No - Patient declined  No - Patient declined   Copy of Healthcare Power of Attorney in Chart? No - copy requested  No - copy requested  No - copy requested    Would patient like information on creating a medical advance directive?    No - Patient declined        Data saved with a previous flowsheet row definition    Current Medications (verified) Outpatient Encounter Medications as of 07/11/2024  Medication Sig   albuterol (VENTOLIN HFA) 108 (90 Base) MCG/ACT inhaler Inhale into the lungs every 6 (six) hours as needed for wheezing or shortness of breath.   amoxicillin -clavulanate (AUGMENTIN ) 875-125 MG tablet Take 1 tablet by mouth 2 (two) times daily.   aspirin 81 MG chewable tablet Chew 81 mg by mouth daily.   azelastine  (ASTELIN ) 0.1 % nasal spray USE 1 SPRAY INTO EACH NOSTRIL TWICE A DAY   Biotin 2500 MCG CAPS Take 2,500 mcg by mouth daily.   Blood Glucose Monitoring Suppl (ONETOUCH VERIO FLEX SYSTEM) w/Device KIT USE AS DIRECTED TO CHECK BLOOD SUGARS   clotrimazole -betamethasone  (LOTRISONE ) cream APPLY TO AFFECTED AREA TWICE A DAY   Continuous Blood Gluc Receiver (FREESTYLE LIBRE  3 READER) DEVI 1 Device by Does not apply route continuous. Use to monitor blood sugars   Continuous Glucose Sensor (FREESTYLE LIBRE 3 SENSOR) MISC PLACE 1 SENSOR ON THE SKIN EVERY 14 DAYS. USE TO CHECK GLUCOSE CONTINUOUSLY   dapagliflozin  propanediol (FARXIGA ) 10 MG TABS tablet Take 1 tablet (10 mg total) by mouth daily before breakfast.   diphenhydrAMINE  (BENADRYL  ALLERGY) 25 MG tablet Take 2 tablets (50 mg total) by mouth every 8 (eight) hours as  needed for allergies (allergic reaction).   EPINEPHrine  0.3 mg/0.3 mL IJ SOAJ injection as needed.   famotidine  (PEPCID ) 20 MG tablet TAKE 1 TABLET BY MOUTH TWICE A DAY   fexofenadine (ALLEGRA) 180 MG tablet Take 180 mg by mouth daily.   Fluocinolone Acetonide Body 0.01 % OIL SMARTSIG:sparingly Topical 3 Times Daily PRN   fluticasone  (FLONASE) 50 MCG/ACT nasal spray    fluticasone -salmeterol (ADVAIR HFA) 115-21 MCG/ACT inhaler Inhale 2 puffs into the lungs 2 (two) times daily.   glucose blood (ONETOUCH VERIO) test strip Use to check blood sugar 1x daily   irbesartan (AVAPRO) 150 MG tablet TAKE 1 TABLET BY MOUTH EVERY DAY   metFORMIN  (GLUCOPHAGE -XR) 500 MG 24 hr tablet TAKE 2 TABLETS BY MOUTH TWICE A DAY   OneTouch Delica Lancets 30G MISC USE TO CHECK BLOOD SUGER TWICE A DAY   OVER THE COUNTER MEDICATION Saw Palmetto 450 mg, twice daily.   OVER THE COUNTER MEDICATION Vitamin D  3 One capsule daily.   promethazine -dextromethorphan (PROMETHAZINE -DM) 6.25-15 MG/5ML syrup Take 5 mLs by mouth 4 (four) times daily as needed.   rosuvastatin  (CRESTOR ) 10 MG tablet TAKE 1 TABLET BY MOUTH EVERY DAY   RSV vaccine recomb adjuvanted (AREXVY ) 120 MCG/0.5ML injection Inject into the muscle.   Saw Palmetto 450 MG CAPS Take 450 mg by mouth in the morning and at bedtime.   sildenafil  (VIAGRA ) 100 MG tablet TAKE 1 TABLET BY MOUTH EVERY DAY AS NEEDED FOR ERECTILE DYSFUNCTION   UNABLE TO FIND Med Name: Takes allergy shots weekly   pseudoephedrine  (SUDAFED) 30 MG tablet Take 2 tablets (60 mg total) by mouth every 4 (four) hours as needed for congestion (allergic reaction). (Patient not taking: Reported on 07/11/2024)   No facility-administered encounter medications on file as of 07/11/2024.    Allergies (verified) Symbicort [budesonide-formoterol fumarate] and Simvastatin    History: Past Medical History:  Diagnosis Date   Alcohol abuse    hixtory of   Allergy    Asthma childhood   Cancer (HCC)    skin  cancer on face X1   Cataract    Chronic kidney disease    Diabetes mellitus without complication (HCC)    GERD (gastroesophageal reflux disease)    History of kidney stones    Hx of colonic polyps    Hyperlipidemia    Rhinitis    on allery shots   Past Surgical History:  Procedure Laterality Date   CATARACT EXTRACTION Left 2020   COLONOSCOPY  03/04/2019   Dr.Perry   EXTRACORPOREAL SHOCK WAVE LITHOTRIPSY Right 03/14/2018   Procedure: RIGHT EXTRACORPOREAL SHOCK WAVE LITHOTRIPSY (ESWL);  Surgeon: Renda Glance, MD;  Location: [facility];  Service: Urology;  Laterality: Right;   hernia with hydrocele  1973   left shoulder pin setting  1978   TONSILLECTOMY  1967   WISDOM TOOTH EXTRACTION  2004   Family History  Problem Relation Age of Onset   Cancer Mother        breast (hx of)   Other Mother  trigeminal neuralgia s/p gamma knife/ CHF   Hypertension Father    Diabetes Neg Hx    Colon cancer Neg Hx    Stomach cancer Neg Hx    Rectal cancer Neg Hx    Social History   Socioeconomic History   Marital status: Married    Spouse name: Not on file   Number of children: 0   Years of education: Not on file   Highest education level: Bachelor's degree (e.g., BA, AB, BS)  Occupational History   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 Married 1991- No children. Marriage is in good health. 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 Life is good.      Regular exercise: daily/walk at gym   Caffeine use: 2 cups of  coffee daily         Social Drivers of Health   Financial Resource Strain: Low Risk  (07/11/2024)   Overall Financial Resource Strain (CARDIA)    Difficulty of Paying Living Expenses: Not very hard  Food Insecurity: No Food Insecurity (07/11/2024)   Hunger Vital Sign    Worried About Running Out of Food in the Last Year: Never true    Ran Out of Food in the Last Year: Never true  Transportation Needs: No Transportation Needs (07/11/2024)   PRAPARE - Administrator, Civil Service (Medical): No    Lack of Transportation (Non-Medical): No  Physical Activity: Sufficiently Active (07/11/2024)   Exercise Vital Sign    Days of Exercise per Week: 6 days    Minutes of Exercise per Session: 60 min  Stress: No Stress Concern Present (07/11/2024)   [employer] of Occupational Health - Occupational Stress Questionnaire    Feeling of Stress: Only a little  Social Connections: Socially Integrated (07/11/2024)   Social Connection and Isolation Panel    Frequency of Communication with Friends and Family: More than three times a week    Frequency of Social Gatherings with Friends and Family: Twice a week    Attends Religious Services: More than 4 times per year    Active Member of [employer] or Organizations: Yes    Attends Engineer, structural: More than 4 times per year    Marital Status: Married    Tobacco Counseling Counseling given: No Tobacco comments: only for a short period in the 1980's    Clinical Intake:  Pre-visit preparation completed: Yes  Pain : No/denies pain     BMI - recorded: 26.98 Nutritional Status: BMI 25 -29 Overweight Nutritional Risks: None Diabetes: Yes CBG done?: Yes CBG resulted in Enter/ Edit results?: Yes (fasting - 128)  Lab Results  Component Value Date   HGBA1C 6.6 (A) 04/14/2024   HGBA1C 7.2 (A) 01/09/2024   HGBA1C 7.4 (H) 10/03/2023     How often do you need to have someone help you when you read instructions, pamphlets, or  other  written materials from your doctor or pharmacy?: 1 - Never  Interpreter Needed?: No  Information entered by :: Verdie Saba, CMA   Activities of Daily Living     07/11/2024    3:10 PM 07/04/2024    8:11 AM  In your present state of health, do you have any difficulty performing the following activities:  Hearing? 0 0  Vision? 0 0  Difficulty concentrating or making decisions? 0 0  Walking or climbing stairs? 0 0  Dressing or bathing? 0 0  Doing errands, shopping? 0 0  Preparing Food and eating ? N N  Using the Toilet? N N  In the past six months, have you accidently leaked urine? N N  Do you have problems with loss of bowel control? N N  Managing your Medications? N N  Managing your Finances? N N  Housekeeping or managing your Housekeeping? N N    Patient Care Team: Plotnikov, Karlynn GAILS, MD as PCP - General (Internal Medicine) Renda Glance, MD as Consulting Physician (Urology) Shamleffer, Donell Cardinal, MD as Consulting Physician (Endocrinology) Glendia Simmonds, OD as Referring Physician (Optometry) Jesus Oliphant, MD as Consulting Physician (Otolaryngology) Cheryn Nickels, MD as Referring Physician (Allergy and Immunology) Octavia Bruckner, MD as Consulting Physician (Ophthalmology)  I have updated your Care Teams any recent Medical Services you may have received from other providers in the past year.     Assessment:   This is a routine wellness examination for Brian Ayers.  Hearing/Vision screen Hearing Screening - Comments:: Denies hearing difficulties   Vision Screening - Comments:: Wears rx glasses - up to date with routine eye exams with Dr Medford Octavia   Goals Addressed               This Visit's Progress     Patient Stated (pt-stated)        Patient stated he plans to stay active exercising and watching glucose levels       Depression Screen     07/11/2024    3:11 PM 03/04/2024   11:15 AM 10/04/2023   10:04 AM 06/15/2023   11:30 AM 06/15/2023   11:25 AM  04/04/2023   10:33 AM 06/12/2022    3:24 PM  PHQ 2/9 Scores  PHQ - 2 Score 3 0 0 0 0 0 0  PHQ- 9 Score 3          Fall Risk     07/11/2024    3:11 PM 07/04/2024    8:11 AM 03/04/2024   11:15 AM 10/04/2023   10:04 AM 06/15/2023   11:25 AM  Fall Risk   Falls in the past year? 0 0 0 0 0  Number falls in past yr: 0 0 0 0 0  Injury with Fall? 0 0 0 0 0  Risk for fall due to : No Fall Risks  No Fall Risks No Fall Risks No Fall Risks  Follow up Falls evaluation completed;Falls prevention discussed  Falls evaluation completed Falls evaluation completed Falls evaluation completed    MEDICARE RISK AT HOME:  Medicare Risk at Home Any stairs in or around the home?: Yes If so, are there any without handrails?: No Home free of loose throw rugs in walkways, pet beds, electrical cords, etc?: Yes Adequate lighting in your home to reduce risk of falls?: Yes Life alert?: No Use of a cane, walker or w/c?: No Grab bars in the bathroom?: No Shower chair or bench in shower?: No Elevated toilet seat or a handicapped toilet?:  No  TIMED UP AND GO:  Was the test performed?  No  Cognitive Function: 6CIT completed        07/11/2024    3:11 PM 06/15/2023   11:34 AM  6CIT Screen  What Year? 0 points 0 points  What month? 0 points 0 points  What time? 0 points 0 points  Count back from 20 0 points 0 points  Months in reverse 0 points 0 points  Repeat phrase 0 points 0 points  Total Score 0 points 0 points    Immunizations Immunization History  Administered Date(s) Administered   Fluad Quad(high Dose 65+) 10/01/2019, 10/06/2020, 10/19/2021, 09/20/2022   Fluad Trivalent(High Dose 65+) 10/01/2023   Influenza Split 09/28/2011, 10/08/2012   Influenza Whole 10/09/2008, 10/12/2009, 10/10/2010   Influenza, High Dose Seasonal PF 10/14/2018   Influenza,inj,Quad PF,6+ Mos 10/08/2013, 09/09/2014, 10/08/2015, 10/09/2016, 10/08/2017   Influenza-Unspecified 10/01/2023   Moderna Covid-19 Vaccine  Bivalent  Booster 48yrs & up 09/23/2021   Moderna SARS-COV2 Booster Vaccination 10/31/2020, 04/29/2021   Moderna Sars-Covid-2 Vaccination 01/11/2020, 02/16/2020, 10/31/2020   Pneumococcal Conjugate-13 06/29/2015, 03/28/2021   Pneumococcal Polysaccharide-23 05/26/2014, 07/15/2019   Respiratory Syncytial Virus Vaccine ,Recomb Aduvanted(Arexvy ) 12/21/2022   Td 07/19/1998, 06/08/2009   Tdap 07/15/2019   Unspecified SARS-COV-2 Vaccination 09/20/2022   Zoster Recombinant(Shingrix ) 03/29/2022, 06/29/2022   Zoster, Live 06/09/2014    Screening Tests Health Maintenance  Topic Date Due   Diabetic kidney evaluation - Urine ACR  Never done   COVID-19 Vaccine (6 - 2024-25 season) 08/19/2023   OPHTHALMOLOGY EXAM  07/10/2024   INFLUENZA VACCINE  07/18/2024   Diabetic kidney evaluation - eGFR measurement  10/02/2024   HEMOGLOBIN A1C  10/14/2024   FOOT EXAM  01/08/2025   Medicare Annual Wellness (AWV)  07/11/2025   Colonoscopy  05/26/2027   DTaP/Tdap/Td (4 - Td or Tdap) 07/14/2029   Pneumococcal Vaccine: 50+ Years  Completed   Hepatitis C Screening  Completed   Zoster Vaccines- Shingrix   Completed   Hepatitis B Vaccines  Aged Out   HPV VACCINES  Aged Out   Meningococcal B Vaccine  Aged Out    Health Maintenance  Health Maintenance Due  Topic Date Due   Diabetic kidney evaluation - Urine ACR  Never done   COVID-19 Vaccine (6 - 2024-25 season) 08/19/2023   OPHTHALMOLOGY EXAM  07/10/2024   Health Maintenance Items Addressed:07/11/2024  Additional Screening:  Vision Screening: Recommended annual ophthalmology exams for early detection of glaucoma and other disorders of the eye. Would you like a referral to an eye doctor? No    Dental Screening: Recommended annual dental exams for proper oral hygiene  Community Resource Referral / Chronic Care Management: CRR required this visit?  No   CCM required this visit?  No   Plan:    I have personally reviewed and noted the following in the  patient's chart:   Medical and social history Use of alcohol, tobacco or illicit drugs  Current medications and supplements including opioid prescriptions. Patient is not currently taking opioid prescriptions. Functional ability and status Nutritional status Physical activity Advanced directives List of other physicians Hospitalizations, surgeries, and ER visits in previous 12 months Vitals Screenings to include cognitive, depression, and falls Referrals and appointments  In addition, I have reviewed and discussed with patient certain preventive protocols, quality metrics, and best practice recommendations. A written personalized care plan for preventive services as well as general preventive health recommendations were provided to patient.   Verdie CHRISTELLA Saba, CMA   07/11/2024  After Visit Summary: (MyChart) Due to this being a telephonic visit, the after visit summary with patients personalized plan was offered to patient via MyChart   Notes: Nothing significant to report at this time.

## 2024-07-11 NOTE — ED Triage Notes (Signed)
 [facility] with [facility] cough, congestion, and fatigue since Wednesday. Pt reports he took a Covid test today and was positive.

## 2024-07-11 NOTE — ED Provider Notes (Signed)
 [facility]    CSN: [id number] Arrival date & time: 07/11/24  1525      History   Chief Complaint Chief Complaint  Patient presents with   Cough    Flu like symptons - Entered by patient   Covid Positive    HPI NIJEL FLINK is a 73 y.o. male.   HPI 73 year old male presents with cough congestion, and fatigue for 2 days.  Reports positive COVID-19 test at home.  Significant for chronic asthma, T2DM, and HTN.  Past Medical History:  Diagnosis Date   Alcohol abuse    hixtory of   Allergy    Asthma childhood   Cancer (HCC)    skin cancer on face X1   Cataract    Chronic kidney disease    Diabetes mellitus without complication (HCC)    GERD (gastroesophageal reflux disease)    History of kidney stones    Hx of colonic polyps    Hyperlipidemia    Rhinitis    on allery shots    Patient Active Problem List   Diagnosis Date Noted   CAP (community acquired pneumonia) 04/06/2024   Callus of foot 01/09/2024   Tachycardia 09/28/2022   Allergic reaction 04/18/2022   Allergic rhinitis due to animal (cat) (dog) hair and dander 03/29/2022   Allergic rhinitis due to pollen 03/29/2022   Mild intermittent asthma 03/29/2022   Diabetes mellitus (HCC) 08/26/2021   Type 2 diabetes mellitus without complication, without long-term current use of insulin (HCC) 04/22/2021   Umbilical hernia 03/28/2021   Situational anxiety 02/03/2021   Presbycusis of both ears 12/24/2020   Nonallopathic lesion of lumbosacral region 10/21/2018   Nonallopathic lesion of sacral region 10/21/2018   Nonallopathic lesion of thoracic region 10/21/2018   Lumbar back pain with radiculopathy affecting left lower extremity 07/01/2018   Renal stone 04/16/2018   Erectile dysfunction 11/06/2016   Tinea corporis 11/06/2016   Asthma, chronic 03/13/2016   Trigger finger, acquired 01/12/2015   Hypertension 09/09/2014   Metatarsalgia 08/10/2014   Diabetes type 2, controlled (HCC) 06/23/2014   Well  adult exam 05/25/2014   GERD (gastroesophageal reflux disease) 05/25/2014   Elevated blood pressure reading without diagnosis of hypertension 11/01/2012   Routine health maintenance 09/14/2011   History of colonic polyps 05/17/2008   PLANTAR FASCIITIS, BILATERAL 05/15/2008   ILIOTIBIAL BAND SYNDROME, LEFT KNEE 05/15/2008   TALIPES CAVUS 05/15/2008   Dyslipidemia 09/16/2007   RHINITIS 09/16/2007    Past Surgical History:  Procedure Laterality Date   CATARACT EXTRACTION Left 2020   COLONOSCOPY  03/04/2019   Dr.Perry   EXTRACORPOREAL SHOCK WAVE LITHOTRIPSY Right 03/14/2018   Procedure: RIGHT EXTRACORPOREAL SHOCK WAVE LITHOTRIPSY (ESWL);  Surgeon: Renda Glance, MD;  Location: [facility];  Service: Urology;  Laterality: Right;   hernia with hydrocele  1973   left shoulder pin setting  1978   TONSILLECTOMY  1967   WISDOM TOOTH EXTRACTION  2004       Home Medications    Prior to Admission medications   Medication Sig Start Date End Date Taking? Authorizing Provider  nirmatrelvir/ritonavir (PAXLOVID, 300/100,) 20 x 150 MG & 10 x 100MG  TBPK Take 3 tablets by mouth 2 (two) times daily for 5 days. Patient GFR is 85.40 Take nirmatrelvir (150 mg) two tablets twice daily for 5 days and ritonavir (100 mg) one tablet twice daily for 5 days. 07/11/24 07/16/24 Yes Teddy Sharper, FNP  promethazine -dextromethorphan (PROMETHAZINE -DM) 6.25-15 MG/5ML syrup Take 5 mLs by mouth 2 (two) times  daily as needed for cough. 07/11/24  Yes Teddy Sharper, FNP  albuterol (VENTOLIN HFA) 108 (90 Base) MCG/ACT inhaler Inhale into the lungs every 6 (six) hours as needed for wheezing or shortness of breath.    [provider]  aspirin 81 MG chewable tablet Chew 81 mg by mouth daily.    [provider]  azelastine  (ASTELIN ) 0.1 % nasal spray USE 1 SPRAY INTO EACH NOSTRIL TWICE A DAY 06/02/19   [provider]  Biotin 2500 MCG CAPS Take 2,500 mcg by mouth daily.    [provider]  Blood  Glucose Monitoring Suppl (ONETOUCH VERIO FLEX SYSTEM) w/Device KIT USE AS DIRECTED TO CHECK BLOOD SUGARS 01/25/24   Shamleffer, Ibtehal Jaralla, MD  clotrimazole -betamethasone  (LOTRISONE ) cream APPLY TO AFFECTED AREA TWICE A DAY 02/22/24   Plotnikov, Aleksei V, MD  Continuous Blood Gluc Receiver (FREESTYLE LIBRE 3 READER) DEVI 1 Device by Does not apply route continuous. Use to monitor blood sugars 02/21/23   Shamleffer, Ibtehal Jaralla, MD  Continuous Glucose Sensor (FREESTYLE LIBRE 3 SENSOR) MISC PLACE 1 SENSOR ON THE SKIN EVERY 14 DAYS. USE TO CHECK GLUCOSE CONTINUOUSLY 04/24/24   Shamleffer, Ibtehal Jaralla, MD  dapagliflozin  propanediol (FARXIGA ) 10 MG TABS tablet Take 1 tablet (10 mg total) by mouth daily before breakfast. 04/10/24   Shamleffer, Ibtehal Jaralla, MD  diphenhydrAMINE  (BENADRYL  ALLERGY) 25 MG tablet Take 2 tablets (50 mg total) by mouth every 8 (eight) hours as needed for allergies (allergic reaction). 09/28/22   Plotnikov, Aleksei V, MD  EPINEPHrine  0.3 mg/0.3 mL IJ SOAJ injection as needed. 04/03/16   [provider]  famotidine  (PEPCID ) 20 MG tablet TAKE 1 TABLET BY MOUTH TWICE A DAY 02/29/24   Plotnikov, Aleksei V, MD  fexofenadine (ALLEGRA) 180 MG tablet Take 180 mg by mouth daily. 06/16/20   [provider]  Fluocinolone Acetonide Body 0.01 % OIL SMARTSIG:sparingly Topical 3 Times Daily PRN 04/13/22   [provider]  fluticasone  OREN) 50 MCG/ACT nasal spray  06/02/19   [provider]  fluticasone -salmeterol (ADVAIR HFA) 115-21 MCG/ACT inhaler Inhale 2 puffs into the lungs 2 (two) times daily.    [provider]  glucose blood (ONETOUCH VERIO) test strip Use to check blood sugar 1x daily 12/18/23   Shamleffer, Ibtehal Jaralla, MD  irbesartan (AVAPRO) 150 MG tablet TAKE 1 TABLET BY MOUTH EVERY DAY 12/03/23   Plotnikov, Aleksei V, MD  metFORMIN  (GLUCOPHAGE -XR) 500 MG 24 hr tablet TAKE 2 TABLETS BY MOUTH TWICE A DAY 05/26/24   Shamleffer, Ibtehal  Jaralla, MD  OneTouch Delica Lancets 30G MISC USE TO CHECK BLOOD SUGER TWICE A DAY 11/16/21   Plotnikov, Karlynn GAILS, MD  OVER THE COUNTER MEDICATION Saw Palmetto 450 mg, twice daily.    [provider]  OVER THE COUNTER MEDICATION Vitamin D  3 One capsule daily.    [provider]  pseudoephedrine  (SUDAFED) 30 MG tablet Take 2 tablets (60 mg total) by mouth every 4 (four) hours as needed for congestion (allergic reaction). Patient not taking: Reported on 07/11/2024 09/28/22   Plotnikov, Karlynn GAILS, MD  rosuvastatin  (CRESTOR ) 10 MG tablet TAKE 1 TABLET BY MOUTH EVERY DAY 06/12/24   Plotnikov, Aleksei V, MD  RSV vaccine recomb adjuvanted (AREXVY ) 120 MCG/0.5ML injection Inject into the muscle. 12/21/22     Saw Palmetto 450 MG CAPS Take 450 mg by mouth in the morning and at bedtime.    [provider]  sildenafil  (VIAGRA ) 100 MG tablet TAKE 1 TABLET BY MOUTH  EVERY DAY AS NEEDED FOR ERECTILE DYSFUNCTION 04/18/24   Plotnikov, Karlynn GAILS, MD  UNABLE TO FIND Med Name: Takes allergy shots weekly    [provider]    Family History Family History  Problem Relation Age of Onset   Cancer Mother        breast (hx of)   Other Mother        trigeminal neuralgia s/p gamma knife/ CHF   Hypertension Father    Diabetes Neg Hx    Colon cancer Neg Hx    Stomach cancer Neg Hx    Rectal cancer Neg Hx     Social History Social History   Tobacco Use   Smoking status: Former   Smokeless tobacco: Former    Types: Snuff, Chew    Quit date: 12/18/1985   Tobacco comments:    only for a short period in the 1980's  Vaping Use   Vaping status: Never Used  Substance Use Topics   Alcohol use: No    Comment: history of ETOH abuse   Drug use: No     Allergies   Symbicort [budesonide-formoterol fumarate] and Simvastatin    Review of Systems Review of Systems  Respiratory:  Positive for choking.   All other systems reviewed and are negative.    Physical Exam Triage Vital  Signs ED Triage Vitals [07/11/24 1538]  Encounter Vitals Group     BP (!) 162/93     Girls Systolic BP Percentile      Girls Diastolic BP Percentile      Boys Systolic BP Percentile      Boys Diastolic BP Percentile      Pulse Rate 85     Resp 19     Temp 97.8 F (36.6 C)     Temp src      SpO2      Weight      Height      Head Circumference      Peak Flow      Pain Score      Pain Loc      Pain Education      Exclude from Growth Chart    No data found.  Updated Vital Signs BP (!) 162/93   Pulse 85   Temp 97.8 F (36.6 C)   Resp 19    Physical Exam Vitals and nursing note reviewed.  Constitutional:      General: He is not in acute distress.    Appearance: Normal appearance. He is normal weight. He is not ill-appearing.  HENT:     Head: Normocephalic and atraumatic.     Right Ear: Tympanic membrane, ear canal and external ear normal.     Left Ear: Tympanic membrane, ear canal and external ear normal.     Mouth/Throat:     Mouth: Mucous membranes are moist.     Pharynx: Oropharynx is clear.  Eyes:     Extraocular Movements: Extraocular movements intact.     Conjunctiva/sclera: Conjunctivae normal.     Pupils: Pupils are equal, round, and reactive to light.  Cardiovascular:     Rate and Rhythm: Normal rate and regular rhythm.     Pulses: Normal pulses.     Heart sounds: Normal heart sounds.  Pulmonary:     Effort: Pulmonary effort is normal.     Breath sounds: Normal breath sounds. No wheezing, rhonchi or rales.     Comments: Infrequent nonproductive cough on exam Musculoskeletal:  General: Normal range of motion.     Cervical back: Normal range of motion and neck supple.  Skin:    General: Skin is warm and dry.  Neurological:     General: No focal deficit present.     Mental Status: He is alert and oriented to person, place, and time.  Psychiatric:        Mood and Affect: Mood normal.        Behavior: Behavior normal.      UC Treatments /  Results  Labs (all labs ordered are listed, but only abnormal results are displayed) Labs Reviewed - No data to display  EKG   Radiology No results found.  Procedures Procedures (including critical care time)  Medications Ordered in UC Medications - No data to display  Initial Impression / Assessment and Plan / UC Course  I have reviewed the triage vital signs and the nursing notes.  Pertinent labs & imaging results that were available during my care of the patient were reviewed by me and considered in my medical decision making (see chart for details).     MDM: 1.  COVID-19-patient positive on home test this morning.  Rx'd Paxlovid (300/100) 200 x 150 mg & 10 x 100 mg: Take 3 tablets by mouth twice daily x 5 days; 2.  Cough, unspecified type-Rx'd Promethazine  DM 6.25-/5 mL syrup: Take 5 mL twice daily, as needed for cough. Advised patient to take Paxlovid as directed with food to completion.  May take Promethazine  DM at night prior to sleep for cough due to sedative effects.  Encouraged to increase daily water intake to 64 ounces per day while taking this medication.  Advised if symptoms worsen and/or unresolved please follow-up with your PCP or here for further evaluation.  Patient discharged home, hemodynamically stable Final Clinical Impressions(s) / UC Diagnoses   Final diagnoses:  COVID-19  Cough, unspecified type     Discharge Instructions      Advised patient to take Paxlovid as directed with food to completion.  May take Promethazine  DM at night prior to sleep for cough due to sedative effects.  Encouraged to increase daily water intake to 64 ounces per day while taking this medication.  Advised if symptoms worsen and/or unresolved please follow-up with your PCP or here for further evaluation.     ED Prescriptions     Medication Sig Dispense Auth. Provider   nirmatrelvir/ritonavir (PAXLOVID, 300/100,) 20 x 150 MG & 10 x 100MG  TBPK Take 3 tablets by mouth 2 (two)  times daily for 5 days. Patient GFR is 85.40 Take nirmatrelvir (150 mg) two tablets twice daily for 5 days and ritonavir (100 mg) one tablet twice daily for 5 days. 30 tablet Myria Steenbergen, FNP   promethazine -dextromethorphan (PROMETHAZINE -DM) 6.25-15 MG/5ML syrup Take 5 mLs by mouth 2 (two) times daily as needed for cough. 118 mL Aleck Locklin, FNP      PDMP not reviewed this encounter.   Teddy Sharper, FNP 07/11/24 [phone]

## 2024-07-11 NOTE — Discharge Instructions (Addendum)
 Advised patient to take Paxlovid as directed with food to completion.  May take Promethazine  DM at night prior to sleep for cough due to sedative effects.  Encouraged to increase daily water intake to 64 ounces per day while taking this medication.  Advised if symptoms worsen and/or unresolved please follow-up with your PCP or here for further evaluation.

## 2024-07-11 NOTE — Patient Instructions (Addendum)
 Mr. Rhinehart , Thank you for taking time out of your busy schedule to complete your Annual Wellness Visit with me. I enjoyed our conversation and look forward to speaking with you again next year. I, as well as your care team,  appreciate your ongoing commitment to your health goals. Please review the following plan we discussed and let me know if I can assist you in the future. Your Game plan/ To Do List    Follow up Visits: Next Medicare AWV with our clinical staff: 07/17/2025 - in the office   Have you seen your provider in the last 6 months (3 months if uncontrolled diabetes)? Yes Next Office Visit with your provider: 10/09/2024  Clinician Recommendations:  Aim for 30 minutes of exercise or brisk walking, 6-8 glasses of water, and 5 servings of fruits and vegetables each day.       This is a list of the screening recommended for you and due dates:  Health Maintenance  Topic Date Due   Yearly kidney health urinalysis for diabetes  Never done   COVID-19 Vaccine (6 - 2024-25 season) 08/19/2023   Eye exam for diabetics  07/10/2024   Flu Shot  07/18/2024   Yearly kidney function blood test for diabetes  10/02/2024   Hemoglobin A1C  10/14/2024   Complete foot exam   01/08/2025   Medicare Annual Wellness Visit  07/11/2025   Colon Cancer Screening  05/26/2027   DTaP/Tdap/Td vaccine (4 - Td or Tdap) 07/14/2029   Pneumococcal Vaccine for age over 71  Completed   Hepatitis C Screening  Completed   Zoster (Shingles) Vaccine  Completed   Hepatitis B Vaccine  Aged Out   HPV Vaccine  Aged Out   Meningitis B Vaccine  Aged Out    Advanced directives: (Copy Requested) Please bring a copy of your health care power of attorney and living will to the office to be added to your chart at your convenience. You can mail to [facility] [address] [place], [place] [address] or email to [email or web address] Advance Care Planning is important because it:  [x]  Makes sure you  receive the medical care that is consistent with your values, goals, and preferences  [x]  It provides guidance to your family and loved ones and reduces their decisional burden about whether or not they are making the right decisions based on your wishes.  Follow the link provided in your after visit summary or read over the paperwork we have mailed to you to help you started getting your Advance Directives in place. If you need assistance in completing these, please reach out to us  so that we can help you!

## 2024-07-17 ENCOUNTER — Encounter: Payer: Self-pay | Admitting: Internal Medicine

## 2024-07-17 ENCOUNTER — Ambulatory Visit: Admitting: Internal Medicine

## 2024-07-17 VITALS — BP 160/83 | HR 67 | Temp 98.0°F | Ht 70.0 in | Wt 196.0 lb

## 2024-07-17 DIAGNOSIS — J4520 Mild intermittent asthma, uncomplicated: Secondary | ICD-10-CM

## 2024-07-17 DIAGNOSIS — E1165 Type 2 diabetes mellitus with hyperglycemia: Secondary | ICD-10-CM | POA: Diagnosis not present

## 2024-07-17 DIAGNOSIS — U071 COVID-19: Secondary | ICD-10-CM | POA: Diagnosis not present

## 2024-07-17 MED ORDER — AZITHROMYCIN 250 MG PO TABS: ORAL_TABLET | ORAL | 0 refills | Status: AC

## 2024-07-17 MED ORDER — HYDROCODONE BIT-HOMATROP MBR 5-1.5 MG/5ML PO SOLN: 5.0000 mL | Freq: Three times a day (TID) | ORAL | 0 refills | Status: AC | PRN

## 2024-07-17 MED ORDER — TADALAFIL 20 MG PO TABS: 20.0000 mg | ORAL_TABLET | ORAL | 3 refills | Status: AC | PRN

## 2024-07-17 NOTE — Progress Notes (Signed)
 Subjective:  Patient ID: Brian Ayers, male    DOB: 1951/02/10  Age: 73 y.o. MRN: [id number]  CC: Cough (Pt is here to make sure everything has cleared up )   HPI [name] presents for COVID  C/o cough. Worse at night C/o ED  Outpatient Medications Prior to Visit  Medication Sig Dispense Refill   albuterol (VENTOLIN HFA) 108 (90 Base) MCG/ACT inhaler Inhale into the lungs every 6 (six) hours as needed for wheezing or shortness of breath.     aspirin 81 MG chewable tablet Chew 81 mg by mouth daily.     azelastine  (ASTELIN ) 0.1 % nasal spray USE 1 SPRAY INTO EACH NOSTRIL TWICE A DAY     Biotin 2500 MCG CAPS Take 2,500 mcg by mouth daily.     Blood Glucose Monitoring Suppl (ONETOUCH VERIO FLEX SYSTEM) w/Device KIT USE AS DIRECTED TO CHECK BLOOD SUGARS 1 kit 0   clotrimazole -betamethasone  (LOTRISONE ) cream APPLY TO AFFECTED AREA TWICE A DAY 45 g 1   Continuous Blood Gluc Receiver (FREESTYLE LIBRE 3 READER) DEVI 1 Device by Does not apply route continuous. Use to monitor blood sugars 1 each 0   Continuous Glucose Sensor (FREESTYLE LIBRE 3 SENSOR) MISC PLACE 1 SENSOR ON THE SKIN EVERY 14 DAYS. USE TO CHECK GLUCOSE CONTINUOUSLY 6 each 3   dapagliflozin  propanediol (FARXIGA ) 10 MG TABS tablet Take 1 tablet (10 mg total) by mouth daily before breakfast. 90 tablet 1   diphenhydrAMINE  (BENADRYL  ALLERGY) 25 MG tablet Take 2 tablets (50 mg total) by mouth every 8 (eight) hours as needed for allergies (allergic reaction). 60 tablet 0   EPINEPHrine  0.3 mg/0.3 mL IJ SOAJ injection as needed.  0   famotidine  (PEPCID ) 20 MG tablet TAKE 1 TABLET BY MOUTH TWICE A DAY 180 tablet 3   fexofenadine (ALLEGRA) 180 MG tablet Take 180 mg by mouth daily.     Fluocinolone Acetonide Body 0.01 % OIL SMARTSIG:sparingly Topical 3 Times Daily PRN     fluticasone  (FLONASE) 50 MCG/ACT nasal spray      fluticasone -salmeterol (ADVAIR HFA) 115-21 MCG/ACT inhaler Inhale 2 puffs into the lungs 2 (two) times daily.      glucose blood (ONETOUCH VERIO) test strip Use to check blood sugar 1x daily 100 strip 5   irbesartan (AVAPRO) 150 MG tablet TAKE 1 TABLET BY MOUTH EVERY DAY 90 tablet 3   metFORMIN  (GLUCOPHAGE -XR) 500 MG 24 hr tablet TAKE 2 TABLETS BY MOUTH TWICE A DAY 360 tablet 1   OneTouch Delica Lancets 30G MISC USE TO CHECK BLOOD SUGER TWICE A DAY 100 each 5   OVER THE COUNTER MEDICATION Saw Palmetto 450 mg, twice daily.     OVER THE COUNTER MEDICATION Vitamin D  3 One capsule daily.     rosuvastatin  (CRESTOR ) 10 MG tablet TAKE 1 TABLET BY MOUTH EVERY DAY 90 tablet 3   RSV vaccine recomb adjuvanted (AREXVY ) 120 MCG/0.5ML injection Inject into the muscle. 0.5 mL 0   Saw Palmetto 450 MG CAPS Take 450 mg by mouth in the morning and at bedtime.     sildenafil  (VIAGRA ) 100 MG tablet TAKE 1 TABLET BY MOUTH EVERY DAY AS NEEDED FOR ERECTILE DYSFUNCTION 30 tablet 4   UNABLE TO FIND Med Name: Takes allergy shots weekly     promethazine -dextromethorphan (PROMETHAZINE -DM) 6.25-15 MG/5ML syrup Take 5 mLs by mouth 2 (two) times daily as needed for cough. 118 mL 0   pseudoephedrine  (SUDAFED) 30 MG tablet Take 2 tablets (60 mg  total) by mouth every 4 (four) hours as needed for congestion (allergic reaction). (Patient not taking: Reported on 07/17/2024) 60 tablet 1   No facility-administered medications prior to visit.    ROS: Review of Systems  Constitutional:  Positive for appetite change, chills and fatigue. Negative for unexpected weight change.  HENT:  Positive for congestion, rhinorrhea and sore throat. Negative for nosebleeds, sneezing and trouble swallowing.   Eyes:  Negative for itching and visual disturbance.  Respiratory:  Positive for cough.   Cardiovascular:  Negative for chest pain, palpitations and leg swelling.  Gastrointestinal:  Negative for abdominal distention, blood in stool, diarrhea and nausea.  Genitourinary:  Negative for frequency and hematuria.  Musculoskeletal:  Negative for back pain, gait  problem, joint swelling and neck pain.  Skin:  Negative for rash.  Neurological:  Negative for dizziness, tremors, speech difficulty and weakness.  Psychiatric/Behavioral:  Negative for agitation, dysphoric mood and sleep disturbance. The patient is not nervous/anxious.     Objective:  BP (!) 160/83 (BP Location: Left Arm, Patient Position: Sitting, Cuff Size: Normal)   Pulse 67   Temp 98 F (36.7 C) (Oral)   Ht 5' 10 (1.778 m)   Wt 196 lb (88.9 kg)   SpO2 98%   BMI 28.12 kg/m   BP Readings from Last 3 Encounters:  07/17/24 (!) 160/83  07/11/24 (!) 162/93  04/14/24 120/80    Wt Readings from Last 3 Encounters:  07/17/24 196 lb (88.9 kg)  07/11/24 188 lb (85.3 kg)  04/14/24 188 lb 12.8 oz (85.6 kg)    Physical Exam Constitutional:      General: He is not in acute distress.    Appearance: He is well-developed.     Comments: NAD  HENT:     Nose: Congestion and rhinorrhea present.     Mouth/Throat:     Pharynx: Posterior oropharyngeal erythema present. No oropharyngeal exudate.  Eyes:     Conjunctiva/sclera: Conjunctivae normal.     Pupils: Pupils are equal, round, and reactive to light.  Neck:     Thyroid : No thyromegaly.     Vascular: No JVD.  Cardiovascular:     Rate and Rhythm: Normal rate and regular rhythm.     Heart sounds: Normal heart sounds. No murmur heard.    No friction rub. No gallop.  Pulmonary:     Effort: Pulmonary effort is normal. No respiratory distress.     Breath sounds: Normal breath sounds. No wheezing or rales.  Chest:     Chest wall: No tenderness.  Abdominal:     General: Bowel sounds are normal. There is no distension.     Palpations: Abdomen is soft. There is no mass.     Tenderness: There is no abdominal tenderness. There is no guarding or rebound.  Musculoskeletal:        General: No tenderness. Normal range of motion.     Cervical back: Normal range of motion.  Lymphadenopathy:     Cervical: No cervical adenopathy.  Skin:     General: Skin is warm and dry.     Findings: No rash.  Neurological:     Mental Status: He is alert and oriented to person, place, and time.     Cranial Nerves: No cranial nerve deficit.     Motor: No abnormal muscle tone.     Coordination: Coordination normal.     Gait: Gait normal.     Deep Tendon Reflexes: Reflexes are normal and symmetric.  Psychiatric:  Behavior: Behavior normal.        Thought Content: Thought content normal.        Judgment: Judgment normal.     Lab Results  Component Value Date   WBC 6.0 10/03/2023   HGB 14.3 10/03/2023   HCT 43.4 10/03/2023   PLT 253.0 10/03/2023   GLUCOSE 145 (H) 10/03/2023   CHOL 154 10/03/2023   TRIG 80.0 10/03/2023   HDL 58.30 10/03/2023   LDLCALC 80 10/03/2023   ALT 22 10/03/2023   AST 18 10/03/2023   NA 137 10/03/2023   K 4.5 10/03/2023   CL 105 10/03/2023   CREATININE 0.91 10/03/2023   BUN 20 10/03/2023   CO2 25 10/03/2023   TSH 3.28 10/03/2023   PSA 0.77 10/03/2023   HGBA1C 6.6 (A) 04/14/2024    No results found.  Assessment & Plan:   Problem List Items Addressed This Visit     Asthma, chronic   Worse.  Continue with the usual inhalers.  Post-COVID bronchitis.  Z-Pak prescribed.  Hycodan cough syrup      COVID-19 - Primary   New.  Treated      Relevant Medications   azithromycin  (ZITHROMAX  Z-PAK) 250 MG tablet   Diabetes type 2, controlled (HCC)   Continue with current treatment plan F/u w/Dr Shamleffer On Metformin , Farxiga  10 mg/d On Libre 3         Meds ordered this encounter  Medications   HYDROcodone  bit-homatropine (HYCODAN) 5-1.5 MG/5ML syrup    Sig: Take 5 mLs by mouth every 8 (eight) hours as needed for cough.    Dispense:  240 mL    Refill:  0   azithromycin  (ZITHROMAX  Z-PAK) 250 MG tablet    Sig: As directed    Dispense:  6 tablet    Refill:  0   tadalafil  (CIALIS ) 20 MG tablet    Sig: Take 1 tablet (20 mg total) by mouth every three (3) days as needed for erectile  dysfunction.    Dispense:  15 tablet    Refill:  3      Follow-up: No follow-ups on file.  Marolyn Noel, MD

## 2024-07-18 DIAGNOSIS — J3089 Other allergic rhinitis: Secondary | ICD-10-CM | POA: Diagnosis not present

## 2024-07-18 DIAGNOSIS — J301 Allergic rhinitis due to pollen: Secondary | ICD-10-CM | POA: Diagnosis not present

## 2024-07-18 DIAGNOSIS — J3081 Allergic rhinitis due to animal (cat) (dog) hair and dander: Secondary | ICD-10-CM | POA: Diagnosis not present

## 2024-07-22 DIAGNOSIS — J3089 Other allergic rhinitis: Secondary | ICD-10-CM | POA: Diagnosis not present

## 2024-07-22 DIAGNOSIS — J301 Allergic rhinitis due to pollen: Secondary | ICD-10-CM | POA: Diagnosis not present

## 2024-07-22 DIAGNOSIS — J3081 Allergic rhinitis due to animal (cat) (dog) hair and dander: Secondary | ICD-10-CM | POA: Diagnosis not present

## 2024-07-23 DIAGNOSIS — E119 Type 2 diabetes mellitus without complications: Secondary | ICD-10-CM | POA: Diagnosis not present

## 2024-07-23 DIAGNOSIS — H43812 Vitreous degeneration, left eye: Secondary | ICD-10-CM | POA: Diagnosis not present

## 2024-07-23 DIAGNOSIS — H35412 Lattice degeneration of retina, left eye: Secondary | ICD-10-CM | POA: Diagnosis not present

## 2024-07-23 DIAGNOSIS — H2511 Age-related nuclear cataract, right eye: Secondary | ICD-10-CM | POA: Diagnosis not present

## 2024-07-23 DIAGNOSIS — Z961 Presence of intraocular lens: Secondary | ICD-10-CM | POA: Diagnosis not present

## 2024-07-23 DIAGNOSIS — H31012 Macula scars of posterior pole (postinflammatory) (post-traumatic), left eye: Secondary | ICD-10-CM | POA: Diagnosis not present

## 2024-07-23 LAB — HM DIABETES EYE EXAM

## 2024-07-24 DIAGNOSIS — H43812 Vitreous degeneration, left eye: Secondary | ICD-10-CM | POA: Diagnosis not present

## 2024-07-24 DIAGNOSIS — H353123 Nonexudative age-related macular degeneration, left eye, advanced atrophic without subfoveal involvement: Secondary | ICD-10-CM | POA: Diagnosis not present

## 2024-07-24 DIAGNOSIS — H35412 Lattice degeneration of retina, left eye: Secondary | ICD-10-CM | POA: Diagnosis not present

## 2024-07-24 DIAGNOSIS — H353132 Nonexudative age-related macular degeneration, bilateral, intermediate dry stage: Secondary | ICD-10-CM | POA: Diagnosis not present

## 2024-07-24 DIAGNOSIS — H31012 Macula scars of posterior pole (postinflammatory) (post-traumatic), left eye: Secondary | ICD-10-CM | POA: Diagnosis not present

## 2024-07-24 DIAGNOSIS — H2511 Age-related nuclear cataract, right eye: Secondary | ICD-10-CM | POA: Diagnosis not present

## 2024-07-25 DIAGNOSIS — J3089 Other allergic rhinitis: Secondary | ICD-10-CM | POA: Diagnosis not present

## 2024-07-25 DIAGNOSIS — J301 Allergic rhinitis due to pollen: Secondary | ICD-10-CM | POA: Diagnosis not present

## 2024-07-25 DIAGNOSIS — J3081 Allergic rhinitis due to animal (cat) (dog) hair and dander: Secondary | ICD-10-CM | POA: Diagnosis not present

## 2024-07-30 DIAGNOSIS — J3089 Other allergic rhinitis: Secondary | ICD-10-CM | POA: Diagnosis not present

## 2024-07-30 DIAGNOSIS — J301 Allergic rhinitis due to pollen: Secondary | ICD-10-CM | POA: Diagnosis not present

## 2024-07-30 DIAGNOSIS — J3081 Allergic rhinitis due to animal (cat) (dog) hair and dander: Secondary | ICD-10-CM | POA: Diagnosis not present

## 2024-08-05 DIAGNOSIS — J3081 Allergic rhinitis due to animal (cat) (dog) hair and dander: Secondary | ICD-10-CM | POA: Diagnosis not present

## 2024-08-05 DIAGNOSIS — J301 Allergic rhinitis due to pollen: Secondary | ICD-10-CM | POA: Diagnosis not present

## 2024-08-05 DIAGNOSIS — J3089 Other allergic rhinitis: Secondary | ICD-10-CM | POA: Diagnosis not present

## 2024-08-12 DIAGNOSIS — J3089 Other allergic rhinitis: Secondary | ICD-10-CM | POA: Diagnosis not present

## 2024-08-12 DIAGNOSIS — J3081 Allergic rhinitis due to animal (cat) (dog) hair and dander: Secondary | ICD-10-CM | POA: Diagnosis not present

## 2024-08-12 DIAGNOSIS — J301 Allergic rhinitis due to pollen: Secondary | ICD-10-CM | POA: Diagnosis not present

## 2024-08-12 NOTE — Assessment & Plan Note (Signed)
 New.  Treated

## 2024-08-12 NOTE — Assessment & Plan Note (Signed)
 Worse.  Continue with the usual inhalers.  Post-COVID bronchitis.  Z-Pak prescribed.  Hycodan cough syrup

## 2024-08-12 NOTE — Assessment & Plan Note (Signed)
 Continue with current treatment plan F/u w/Dr Shamleffer On Metformin , Farxiga  10 mg/d On Libre 3

## 2024-08-14 ENCOUNTER — Ambulatory Visit: Admitting: Internal Medicine

## 2024-08-14 ENCOUNTER — Encounter: Payer: Self-pay | Admitting: Internal Medicine

## 2024-08-14 VITALS — BP 122/74 | HR 90 | Ht 70.0 in

## 2024-08-14 DIAGNOSIS — Z7984 Long term (current) use of oral hypoglycemic drugs: Secondary | ICD-10-CM

## 2024-08-14 DIAGNOSIS — E119 Type 2 diabetes mellitus without complications: Secondary | ICD-10-CM | POA: Diagnosis not present

## 2024-08-14 LAB — POCT GLYCOSYLATED HEMOGLOBIN (HGB A1C): Hemoglobin A1C: 7 % — AB (ref 4.0–5.6)

## 2024-08-14 NOTE — Patient Instructions (Addendum)
-   Continue  Metformin  500 mg, Two tablets with Breakfast and two tablets with Supper - Continue Farxiga  10  mg, 1 tablet daily   You may use Premier protein shake as it has less than carbohydrates     HOW TO TREAT LOW BLOOD SUGARS (Blood sugar LESS THAN 70 MG/DL) Please follow the RULE OF 15 for the treatment of hypoglycemia treatment (when your (blood sugars are less than 70 mg/dL)   STEP 1: Take 15 grams of carbohydrates when your blood sugar is low, which includes:  3-4 GLUCOSE TABS  OR 3-4 OZ OF JUICE OR REGULAR SODA OR ONE TUBE OF GLUCOSE GEL    STEP 2: RECHECK blood sugar in 15 MINUTES STEP 3: If your blood sugar is still low at the 15 minute recheck --> then, go back to STEP 1 and treat AGAIN with another 15 grams of carbohydrates.

## 2024-08-14 NOTE — Progress Notes (Signed)
 Name: Brian Ayers  Age/ Sex: 73 y.o., male   MRN/ DOB: [id number], [birth year]     PCP: Garald Karlynn GAILS, MD   Reason for Endocrinology Evaluation: Type 2 Diabetes Mellitus  Initial Endocrine Consultative Visit: 02/07/2021    PATIENT IDENTIFIER: Brian Ayers is a 73 y.o. male with a past medical history of  T2DM, Asthma and Dyslipidemia, with Hx of alcohol abuse. The patient has followed with Endocrinology clinic since 02/07/2021 for consultative assistance with management of his diabetes.  DIABETIC HISTORY:  Brian Ayers was diagnosed with DM in 2015. He has been on Metformin , repaglinide  started 2/20222  His hemoglobin A1c has ranged from 6.7% in 2019, peaking at 7.6% in 2022.   On his intial visit he had an A1c of 7.6 %. He was just started on Repaglinide  so we continued this until 04/2021 due to recurrent hypoglycemia. We increased Metformin     Restarted Repaglinide  in 06/2021 due to hyperglycemia with Bg's in 200's. But this was discontinued in 08/2021 and started on Farxiga   with an A1c 6.3 %   SUBJECTIVE:   During the last visit (04/14/2024): A1c 6.6%     Today (08/14/2024): Brian Ayers is here for a follow up on diabetes management.  He checks his blood sugars multiple  times daily, through CGM. The patient has had hypoglycemic episodes, unclear the accuracy of freestyle libre.  Patient was diagnosed with COVID in July, 2025, was treated with Paxlovid  Patient continues to follow-up with allergy and immunology for allergic rhinitis  Patient has been noted weight gain Cough has resolved  No SOB No nausea or vomiting  Denies constipation or diarrhea  Up to date on eye exams, noted with regrowth of the left eye previous sx , pending appointment with Dr. Elner    HOME DIABETES REGIMEN:  Metformin  500 mg 2 tabs BID  Farxiga  10 mg daily     Statin: yes ACE-I/ARB: yes     CONTINUOUS GLUCOSE MONITORING RECORD INTERPRETATION    Dates of Recording:  8/15-8/28/2025  Sensor description:freestyle libre  Results statistics:   CGM use % of time 97  Average and SD 125/19.7  Time in range 97%  % Time Above 180 3  % Time above 250 0  % Time Below target 0   Glycemic patterns summary: BGs are optimal throughout the day and night  Hyperglycemic episodes at postprandial   Hypoglycemic episodes occurred hypoglycemia N/A  Overnight periods: Optimal     DIABETIC COMPLICATIONS: Microvascular complications:  Posterior vitreous detachment but no DR Denies: CKD, neuropathy  Last Eye Exam: Completed 07/24/2024 - Dr. Octavia   Macrovascular complications:   Denies: CAD, CVA, PVD   HISTORY:  Past Medical History:  Past Medical History:  Diagnosis Date   Alcohol abuse    hixtory of   Allergy    Asthma childhood   Cancer (HCC)    skin cancer on face X1   Cataract    Chronic kidney disease    Diabetes mellitus without complication (HCC)    GERD (gastroesophageal reflux disease)    History of kidney stones    Hx of colonic polyps    Hyperlipidemia    Rhinitis    on allery shots   Past Surgical History:  Past Surgical History:  Procedure Laterality Date   CATARACT EXTRACTION Left 2020   COLONOSCOPY  03/04/2019   Dr.Perry   EXTRACORPOREAL SHOCK WAVE LITHOTRIPSY Right 03/14/2018   Procedure: RIGHT EXTRACORPOREAL SHOCK WAVE LITHOTRIPSY (ESWL);  Surgeon: Renda,  Gretel, MD;  Location: [facility];  Service: Urology;  Laterality: Right;   hernia with hydrocele  1973   left shoulder pin setting  1978   TONSILLECTOMY  1967   WISDOM TOOTH EXTRACTION  2004   Social History:  reports that he has quit smoking. He quit smokeless tobacco use about 38 years ago.  His smokeless tobacco use included snuff and chew. He reports that he does not drink alcohol and does not use drugs. Family History:  Family History  Problem Relation Age of Onset   Cancer Mother        breast (hx of)   Other Mother        trigeminal neuralgia s/p gamma knife/  CHF   Hypertension Father    Diabetes Neg Hx    Colon cancer Neg Hx    Stomach cancer Neg Hx    Rectal cancer Neg Hx      HOME MEDICATIONS: Allergies as of 08/14/2024       Reactions   Symbicort [budesonide-formoterol Fumarate] Anaphylaxis   Anaphylactic reaction to Symbicort vs other (???) after 10 days of use. Went to ER w/rash/SOB/swollen lips.Using Advair ok   Simvastatin     GERD        Medication List        Accurate as of August 14, 2024 10:36 AM. If you have any questions, ask your nurse or doctor.          Arexvy  120 MCG/0.5ML injection Generic drug: RSV vaccine recomb adjuvanted Inject into the muscle.   aspirin 81 MG chewable tablet Chew 81 mg by mouth daily.   azelastine  0.1 % nasal spray Commonly known as: ASTELIN  USE 1 SPRAY INTO EACH NOSTRIL TWICE A DAY   azithromycin  250 MG tablet Commonly known as: Zithromax  Z-Pak As directed   Biotin 2500 MCG Caps Take 2,500 mcg by mouth daily.   clotrimazole -betamethasone  cream Commonly known as: LOTRISONE  APPLY TO AFFECTED AREA TWICE A DAY   dapagliflozin  propanediol 10 MG Tabs tablet Commonly known as: Farxiga  Take 1 tablet (10 mg total) by mouth daily before breakfast.   diphenhydrAMINE  25 MG tablet Commonly known as: Benadryl  Allergy Take 2 tablets (50 mg total) by mouth every 8 (eight) hours as needed for allergies (allergic reaction).   EPINEPHrine  0.3 mg/0.3 mL Soaj injection Commonly known as: EPI-PEN as needed.   famotidine  20 MG tablet Commonly known as: PEPCID  TAKE 1 TABLET BY MOUTH TWICE A DAY   fexofenadine 180 MG tablet Commonly known as: ALLEGRA Take 180 mg by mouth daily.   Fluocinolone Acetonide Body 0.01 % Oil SMARTSIG:sparingly Topical 3 Times Daily PRN   fluticasone  50 MCG/ACT nasal spray Commonly known as: FLONASE   fluticasone -salmeterol 115-21 MCG/ACT inhaler Commonly known as: ADVAIR HFA Inhale 2 puffs into the lungs 2 (two) times daily.   FreeStyle Libre 3  Reader Devi 1 Device by Does not apply route continuous. Use to monitor blood sugars   FreeStyle Libre 3 Sensor Misc PLACE 1 SENSOR ON THE SKIN EVERY 14 DAYS. USE TO CHECK GLUCOSE CONTINUOUSLY   HYDROcodone  bit-homatropine 5-1.5 MG/5ML syrup Commonly known as: HYCODAN Take 5 mLs by mouth every 8 (eight) hours as needed for cough.   irbesartan 150 MG tablet Commonly known as: AVAPRO TAKE 1 TABLET BY MOUTH EVERY DAY   metFORMIN  500 MG 24 hr tablet Commonly known as: GLUCOPHAGE -XR TAKE 2 TABLETS BY MOUTH TWICE A DAY   OneTouch Delica Lancets 30G Misc USE TO CHECK BLOOD SUGER TWICE A  DAY   OneTouch Verio Flex System w/Device Kit USE AS DIRECTED TO CHECK BLOOD SUGARS   OneTouch Verio test strip Generic drug: glucose blood Use to check blood sugar 1x daily   OVER THE COUNTER MEDICATION Saw Palmetto 450 mg, twice daily.   OVER THE COUNTER MEDICATION Vitamin D  3 One capsule daily.   pseudoephedrine  30 MG tablet Commonly known as: Sudafed Take 2 tablets (60 mg total) by mouth every 4 (four) hours as needed for congestion (allergic reaction).   rosuvastatin  10 MG tablet Commonly known as: CRESTOR  TAKE 1 TABLET BY MOUTH EVERY DAY   Saw Palmetto 450 MG Caps Take 450 mg by mouth in the morning and at bedtime.   sildenafil  100 MG tablet Commonly known as: VIAGRA  TAKE 1 TABLET BY MOUTH EVERY DAY AS NEEDED FOR ERECTILE DYSFUNCTION   tadalafil  20 MG tablet Commonly known as: Cialis  Take 1 tablet (20 mg total) by mouth every three (3) days as needed for erectile dysfunction.   UNABLE TO FIND Med Name: Takes allergy shots weekly   Ventolin HFA 108 (90 Base) MCG/ACT inhaler Generic drug: albuterol Inhale into the lungs every 6 (six) hours as needed for wheezing or shortness of breath.         OBJECTIVE:   Vital Signs: BP 122/74 (BP Location: Left Arm, Patient Position: Sitting, Cuff Size: Normal)   Pulse 90   Ht 5' 10 (1.778 m)   SpO2 99%   BMI 28.12 kg/m   Wt  Readings from Last 3 Encounters:  07/17/24 196 lb (88.9 kg)  07/11/24 188 lb (85.3 kg)  04/14/24 188 lb 12.8 oz (85.6 kg)     Exam: General: Pt appears well and is in NAD  Lungs: Clear with good BS bilat   Heart: RRR    Abdomen: Soft, nontender  Extremities: No pretibial edema.   Neuro: MS is good with appropriate affect, pt is alert and Ox3    DM foot exam: 08/14/2024  The skin of the feet is without sores or ulcerations, patient with bilateral plantar callus formation The pedal pulses are 2+ on right and 2+ on left. The sensation is intact to a screening 5.07, 10 gram monofilament bilaterally    DATA REVIEWED:  Lab Results  Component Value Date   HGBA1C 7.0 (A) 08/14/2024   HGBA1C 6.6 (A) 04/14/2024   HGBA1C 7.2 (A) 01/09/2024     Latest Reference Range & Units 10/03/23 08:08  Sodium 135 - 145 mEq/L 137  Potassium 3.5 - 5.1 mEq/L 4.5  Chloride 96 - 112 mEq/L 105  CO2 19 - 32 mEq/L 25  Glucose 70 - 99 mg/dL 854 (H)  BUN 6 - 23 mg/dL 20  Creatinine 9.59 - 8.49 mg/dL 9.08  Calcium  8.4 - 10.5 mg/dL 9.1  Alkaline Phosphatase 39 - 117 U/L 59  Albumin 3.5 - 5.2 g/dL 4.1  AST 0 - 37 U/L 18  ALT 0 - 53 U/L 22  Total Protein 6.0 - 8.3 g/dL 6.0  Total Bilirubin 0.2 - 1.2 mg/dL 0.9  GFR >39.99 mL/min 85.49    Latest Reference Range & Units 10/03/23 08:08  Total CHOL/HDL Ratio  3  Cholesterol 0 - 200 mg/dL 845  HDL Cholesterol >60.99 mg/dL 41.69  LDL (calc) 0 - 99 mg/dL 80  MICROALB/CREAT RATIO 0.0 - 30.0 mg/g 6.3  NonHDL  95.71  Triglycerides 0.0 - 149.0 mg/dL 19.9  VLDL 0.0 - 59.9 mg/dL 83.9       ASSESSMENT / PLAN / RECOMMENDATIONS:  1) Type 2 Diabetes Mellitus, Optimally controlled, Without complications - Most recent A1c of 7.0%. Goal A1c < 7.0 %.     -A1c remains optimal -Historically repaglinide  has caused hypoglycemia - He has been drinking Glucerna and replacement of a snack, he believes it may have 30 g of carbohydrates, I did advise the patient  to consider Premier protein shake as he has 5 g of carbohydrate as an alternative - No other changes  MEDICATIONS: Continue  Metformin  500 mg, Two tablets with Breakfast and two tablets with Supper Continue  Farxiga  10 mg , 1 tablet daily    EDUCATION / INSTRUCTIONS: BG monitoring instructions: Patient is instructed to check his blood sugars 3 times a day, before meals . Call [facility] if: BG persistently < 70  I reviewed the Rule of 15 for the treatment of hypoglycemia in detail with the patient. Literature supplied.    2) Diabetic complications:  Eye: Does not have known diabetic retinopathy.  Neuro/ Feet: Does not have known diabetic peripheral neuropathy .  Renal: Patient does not have known baseline CKD. He   is  on an ACEI/ARB at present.     F/U in 6 months     Signed electronically by: Stefano Redgie Butts, MD  [facility]  [facility] [address] [place] [place], [place] [address] Phone: [phone] FAX: [phone]   CC: Garald Karlynn GAILS, MD [address] [place] [address] Phone: [phone]  Fax: [phone]  Return to Endocrinology clinic as below: Future Appointments  Date Time Provider Department Center  08/14/2024 10:50 AM Erastus Bartolomei, Donell Redgie, MD [facility] None  10/09/2024  3:40 PM Plotnikov, Karlynn GAILS, MD [facility]  07/17/2025  8:50 AM [facility]ANNUAL WELLNESS VISIT [facility] Stains

## 2024-08-20 DIAGNOSIS — J301 Allergic rhinitis due to pollen: Secondary | ICD-10-CM | POA: Diagnosis not present

## 2024-08-20 DIAGNOSIS — J3081 Allergic rhinitis due to animal (cat) (dog) hair and dander: Secondary | ICD-10-CM | POA: Diagnosis not present

## 2024-08-20 DIAGNOSIS — J3089 Other allergic rhinitis: Secondary | ICD-10-CM | POA: Diagnosis not present

## 2024-08-27 DIAGNOSIS — L814 Other melanin hyperpigmentation: Secondary | ICD-10-CM | POA: Diagnosis not present

## 2024-08-27 DIAGNOSIS — D225 Melanocytic nevi of trunk: Secondary | ICD-10-CM | POA: Diagnosis not present

## 2024-08-27 DIAGNOSIS — J3089 Other allergic rhinitis: Secondary | ICD-10-CM | POA: Diagnosis not present

## 2024-08-27 DIAGNOSIS — D2272 Melanocytic nevi of left lower limb, including hip: Secondary | ICD-10-CM | POA: Diagnosis not present

## 2024-08-27 DIAGNOSIS — J301 Allergic rhinitis due to pollen: Secondary | ICD-10-CM | POA: Diagnosis not present

## 2024-08-27 DIAGNOSIS — J3081 Allergic rhinitis due to animal (cat) (dog) hair and dander: Secondary | ICD-10-CM | POA: Diagnosis not present

## 2024-08-27 DIAGNOSIS — D1801 Hemangioma of skin and subcutaneous tissue: Secondary | ICD-10-CM | POA: Diagnosis not present

## 2024-08-27 DIAGNOSIS — Z85828 Personal history of other malignant neoplasm of skin: Secondary | ICD-10-CM | POA: Diagnosis not present

## 2024-08-27 DIAGNOSIS — L578 Other skin changes due to chronic exposure to nonionizing radiation: Secondary | ICD-10-CM | POA: Diagnosis not present

## 2024-08-27 DIAGNOSIS — L570 Actinic keratosis: Secondary | ICD-10-CM | POA: Diagnosis not present

## 2024-08-27 DIAGNOSIS — C44329 Squamous cell carcinoma of skin of other parts of face: Secondary | ICD-10-CM | POA: Diagnosis not present

## 2024-08-27 DIAGNOSIS — D485 Neoplasm of uncertain behavior of skin: Secondary | ICD-10-CM | POA: Diagnosis not present

## 2024-08-27 DIAGNOSIS — D0461 Carcinoma in situ of skin of right upper limb, including shoulder: Secondary | ICD-10-CM | POA: Diagnosis not present

## 2024-09-01 DIAGNOSIS — H35412 Lattice degeneration of retina, left eye: Secondary | ICD-10-CM | POA: Diagnosis not present

## 2024-09-01 DIAGNOSIS — H43812 Vitreous degeneration, left eye: Secondary | ICD-10-CM | POA: Diagnosis not present

## 2024-09-01 DIAGNOSIS — H2511 Age-related nuclear cataract, right eye: Secondary | ICD-10-CM | POA: Diagnosis not present

## 2024-09-01 DIAGNOSIS — H31012 Macula scars of posterior pole (postinflammatory) (post-traumatic), left eye: Secondary | ICD-10-CM | POA: Diagnosis not present

## 2024-09-01 DIAGNOSIS — H353123 Nonexudative age-related macular degeneration, left eye, advanced atrophic without subfoveal involvement: Secondary | ICD-10-CM | POA: Diagnosis not present

## 2024-09-01 DIAGNOSIS — H353132 Nonexudative age-related macular degeneration, bilateral, intermediate dry stage: Secondary | ICD-10-CM | POA: Diagnosis not present

## 2024-09-02 DIAGNOSIS — H1132 Conjunctival hemorrhage, left eye: Secondary | ICD-10-CM | POA: Diagnosis not present

## 2024-09-02 DIAGNOSIS — H353132 Nonexudative age-related macular degeneration, bilateral, intermediate dry stage: Secondary | ICD-10-CM | POA: Diagnosis not present

## 2024-09-02 DIAGNOSIS — H31012 Macula scars of posterior pole (postinflammatory) (post-traumatic), left eye: Secondary | ICD-10-CM | POA: Diagnosis not present

## 2024-09-02 DIAGNOSIS — H35412 Lattice degeneration of retina, left eye: Secondary | ICD-10-CM | POA: Diagnosis not present

## 2024-09-02 DIAGNOSIS — J3089 Other allergic rhinitis: Secondary | ICD-10-CM | POA: Diagnosis not present

## 2024-09-02 DIAGNOSIS — J3081 Allergic rhinitis due to animal (cat) (dog) hair and dander: Secondary | ICD-10-CM | POA: Diagnosis not present

## 2024-09-02 DIAGNOSIS — H2511 Age-related nuclear cataract, right eye: Secondary | ICD-10-CM | POA: Diagnosis not present

## 2024-09-02 DIAGNOSIS — H353123 Nonexudative age-related macular degeneration, left eye, advanced atrophic without subfoveal involvement: Secondary | ICD-10-CM | POA: Diagnosis not present

## 2024-09-02 DIAGNOSIS — H43812 Vitreous degeneration, left eye: Secondary | ICD-10-CM | POA: Diagnosis not present

## 2024-09-02 DIAGNOSIS — J301 Allergic rhinitis due to pollen: Secondary | ICD-10-CM | POA: Diagnosis not present

## 2024-09-10 DIAGNOSIS — J3089 Other allergic rhinitis: Secondary | ICD-10-CM | POA: Diagnosis not present

## 2024-09-10 DIAGNOSIS — J3081 Allergic rhinitis due to animal (cat) (dog) hair and dander: Secondary | ICD-10-CM | POA: Diagnosis not present

## 2024-09-10 DIAGNOSIS — J301 Allergic rhinitis due to pollen: Secondary | ICD-10-CM | POA: Diagnosis not present

## 2024-09-16 DIAGNOSIS — J3081 Allergic rhinitis due to animal (cat) (dog) hair and dander: Secondary | ICD-10-CM | POA: Diagnosis not present

## 2024-09-16 DIAGNOSIS — J301 Allergic rhinitis due to pollen: Secondary | ICD-10-CM | POA: Diagnosis not present

## 2024-09-16 DIAGNOSIS — J3089 Other allergic rhinitis: Secondary | ICD-10-CM | POA: Diagnosis not present

## 2024-09-23 DIAGNOSIS — J3081 Allergic rhinitis due to animal (cat) (dog) hair and dander: Secondary | ICD-10-CM | POA: Diagnosis not present

## 2024-09-23 DIAGNOSIS — J3089 Other allergic rhinitis: Secondary | ICD-10-CM | POA: Diagnosis not present

## 2024-09-23 DIAGNOSIS — J301 Allergic rhinitis due to pollen: Secondary | ICD-10-CM | POA: Diagnosis not present

## 2024-09-27 ENCOUNTER — Other Ambulatory Visit: Payer: Self-pay | Admitting: Internal Medicine

## 2024-09-29 DIAGNOSIS — J3089 Other allergic rhinitis: Secondary | ICD-10-CM | POA: Diagnosis not present

## 2024-09-29 DIAGNOSIS — J3081 Allergic rhinitis due to animal (cat) (dog) hair and dander: Secondary | ICD-10-CM | POA: Diagnosis not present

## 2024-09-29 DIAGNOSIS — J301 Allergic rhinitis due to pollen: Secondary | ICD-10-CM | POA: Diagnosis not present

## 2024-10-01 ENCOUNTER — Ambulatory Visit

## 2024-10-02 DIAGNOSIS — C44329 Squamous cell carcinoma of skin of other parts of face: Secondary | ICD-10-CM | POA: Diagnosis not present

## 2024-10-03 ENCOUNTER — Other Ambulatory Visit (HOSPITAL_BASED_OUTPATIENT_CLINIC_OR_DEPARTMENT_OTHER): Payer: Self-pay

## 2024-10-03 DIAGNOSIS — Z23 Encounter for immunization: Secondary | ICD-10-CM | POA: Diagnosis not present

## 2024-10-03 MED ORDER — COMIRNATY 30 MCG/0.3ML IM SUSY
0.3000 mL | PREFILLED_SYRINGE | Freq: Once | INTRAMUSCULAR | 0 refills | Status: AC
  Filled 2024-10-03: qty 0.3, 1d supply, fill #0

## 2024-10-07 DIAGNOSIS — J3089 Other allergic rhinitis: Secondary | ICD-10-CM | POA: Diagnosis not present

## 2024-10-07 DIAGNOSIS — J301 Allergic rhinitis due to pollen: Secondary | ICD-10-CM | POA: Diagnosis not present

## 2024-10-07 DIAGNOSIS — J3081 Allergic rhinitis due to animal (cat) (dog) hair and dander: Secondary | ICD-10-CM | POA: Diagnosis not present

## 2024-10-09 ENCOUNTER — Ambulatory Visit: Admitting: Internal Medicine

## 2024-10-09 ENCOUNTER — Ambulatory Visit (INDEPENDENT_AMBULATORY_CARE_PROVIDER_SITE_OTHER)

## 2024-10-09 DIAGNOSIS — Z23 Encounter for immunization: Secondary | ICD-10-CM

## 2024-10-09 NOTE — Progress Notes (Signed)
 After obtaining consent, and per orders of Dr. Garald, injection of High Dose Flu given by Edsel CHRISTELLA Kerns. Patient tolerated well.

## 2024-10-13 DIAGNOSIS — H353132 Nonexudative age-related macular degeneration, bilateral, intermediate dry stage: Secondary | ICD-10-CM | POA: Diagnosis not present

## 2024-10-13 DIAGNOSIS — H35412 Lattice degeneration of retina, left eye: Secondary | ICD-10-CM | POA: Diagnosis not present

## 2024-10-13 DIAGNOSIS — H31012 Macula scars of posterior pole (postinflammatory) (post-traumatic), left eye: Secondary | ICD-10-CM | POA: Diagnosis not present

## 2024-10-13 DIAGNOSIS — H2511 Age-related nuclear cataract, right eye: Secondary | ICD-10-CM | POA: Diagnosis not present

## 2024-10-13 DIAGNOSIS — H1132 Conjunctival hemorrhage, left eye: Secondary | ICD-10-CM | POA: Diagnosis not present

## 2024-10-13 DIAGNOSIS — H353123 Nonexudative age-related macular degeneration, left eye, advanced atrophic without subfoveal involvement: Secondary | ICD-10-CM | POA: Diagnosis not present

## 2024-10-13 DIAGNOSIS — H43812 Vitreous degeneration, left eye: Secondary | ICD-10-CM | POA: Diagnosis not present

## 2024-10-14 DIAGNOSIS — J301 Allergic rhinitis due to pollen: Secondary | ICD-10-CM | POA: Diagnosis not present

## 2024-10-14 DIAGNOSIS — J3089 Other allergic rhinitis: Secondary | ICD-10-CM | POA: Diagnosis not present

## 2024-10-14 DIAGNOSIS — J3081 Allergic rhinitis due to animal (cat) (dog) hair and dander: Secondary | ICD-10-CM | POA: Diagnosis not present

## 2024-10-15 ENCOUNTER — Ambulatory Visit

## 2024-10-20 DIAGNOSIS — J3081 Allergic rhinitis due to animal (cat) (dog) hair and dander: Secondary | ICD-10-CM | POA: Diagnosis not present

## 2024-10-20 DIAGNOSIS — J301 Allergic rhinitis due to pollen: Secondary | ICD-10-CM | POA: Diagnosis not present

## 2024-10-20 DIAGNOSIS — J3089 Other allergic rhinitis: Secondary | ICD-10-CM | POA: Diagnosis not present

## 2024-10-24 ENCOUNTER — Encounter: Payer: Self-pay | Admitting: Internal Medicine

## 2024-10-24 DIAGNOSIS — E785 Hyperlipidemia, unspecified: Secondary | ICD-10-CM

## 2024-10-24 DIAGNOSIS — E119 Type 2 diabetes mellitus without complications: Secondary | ICD-10-CM

## 2024-10-24 DIAGNOSIS — I10 Essential (primary) hypertension: Secondary | ICD-10-CM

## 2024-10-24 DIAGNOSIS — Z0000 Encounter for general adult medical examination without abnormal findings: Secondary | ICD-10-CM

## 2024-10-27 DIAGNOSIS — J3081 Allergic rhinitis due to animal (cat) (dog) hair and dander: Secondary | ICD-10-CM | POA: Diagnosis not present

## 2024-10-27 DIAGNOSIS — J301 Allergic rhinitis due to pollen: Secondary | ICD-10-CM | POA: Diagnosis not present

## 2024-10-27 DIAGNOSIS — J3089 Other allergic rhinitis: Secondary | ICD-10-CM | POA: Diagnosis not present

## 2024-11-03 DIAGNOSIS — J3089 Other allergic rhinitis: Secondary | ICD-10-CM | POA: Diagnosis not present

## 2024-11-03 DIAGNOSIS — J3081 Allergic rhinitis due to animal (cat) (dog) hair and dander: Secondary | ICD-10-CM | POA: Diagnosis not present

## 2024-11-03 DIAGNOSIS — J301 Allergic rhinitis due to pollen: Secondary | ICD-10-CM | POA: Diagnosis not present

## 2024-11-04 ENCOUNTER — Other Ambulatory Visit (INDEPENDENT_AMBULATORY_CARE_PROVIDER_SITE_OTHER)

## 2024-11-04 DIAGNOSIS — E119 Type 2 diabetes mellitus without complications: Secondary | ICD-10-CM

## 2024-11-04 DIAGNOSIS — I10 Essential (primary) hypertension: Secondary | ICD-10-CM | POA: Diagnosis not present

## 2024-11-04 DIAGNOSIS — Z0000 Encounter for general adult medical examination without abnormal findings: Secondary | ICD-10-CM | POA: Diagnosis not present

## 2024-11-04 LAB — LIPID PANEL
Cholesterol: 146 mg/dL (ref 0–200)
HDL: 62.1 mg/dL (ref >39.00)
LDL Cholesterol: 72 mg/dL (ref 0–99)
NonHDL: 83.43
Total CHOL/HDL Ratio: 2
Triglycerides: 58 mg/dL (ref 0.0–149.0)
VLDL: 11.6 mg/dL (ref 0.0–40.0)

## 2024-11-04 LAB — COMPREHENSIVE METABOLIC PANEL WITH GFR
ALT: 20 U/L (ref 0–53)
AST: 18 U/L (ref 0–37)
Albumin: 4.4 g/dL (ref 3.5–5.2)
Alkaline Phosphatase: 53 U/L (ref 39–117)
BUN: 23 mg/dL (ref 6–23)
CO2: 26 meq/L (ref 19–32)
Calcium: 9.5 mg/dL (ref 8.4–10.5)
Chloride: 103 meq/L (ref 96–112)
Creatinine, Ser: 0.95 mg/dL (ref 0.40–1.50)
GFR: 80.57 mL/min (ref >60.00)
Glucose, Bld: 140 mg/dL — ABNORMAL HIGH (ref 70–99)
Potassium: 4.8 meq/L (ref 3.5–5.1)
Sodium: 138 meq/L (ref 135–145)
Total Bilirubin: 0.8 mg/dL (ref 0.2–1.2)
Total Protein: 6.5 g/dL (ref 6.0–8.3)

## 2024-11-04 LAB — URINALYSIS
Bilirubin Urine: NEGATIVE
Hgb urine dipstick: NEGATIVE
Ketones, ur: NEGATIVE
Leukocytes,Ua: NEGATIVE
Nitrite: NEGATIVE
Specific Gravity, Urine: <=1.005 — AB (ref 1.000–1.030)
Total Protein, Urine: NEGATIVE
Urine Glucose: >=1000 — AB
Urobilinogen, UA: 0.2 (ref 0.0–1.0)
pH: 6 (ref 5.0–8.0)

## 2024-11-04 LAB — CBC WITH DIFFERENTIAL/PLATELET
Basophils Absolute: 0.1 K/uL (ref 0.0–0.1)
Basophils Relative: 1.1 % (ref 0.0–3.0)
Eosinophils Absolute: 0.7 K/uL (ref 0.0–0.7)
Eosinophils Relative: 10.1 % — ABNORMAL HIGH (ref 0.0–5.0)
HCT: 42.2 % (ref 39.0–52.0)
Hemoglobin: 14.1 g/dL (ref 13.0–17.0)
Lymphocytes Relative: 36.3 % (ref 12.0–46.0)
Lymphs Abs: 2.5 K/uL (ref 0.7–4.0)
MCHC: 33.4 g/dL (ref 30.0–36.0)
MCV: 93.1 fl (ref 78.0–100.0)
Monocytes Absolute: 0.8 K/uL (ref 0.1–1.0)
Monocytes Relative: 11 % (ref 3.0–12.0)
Neutro Abs: 2.9 K/uL (ref 1.4–7.7)
Neutrophils Relative %: 41.5 % — ABNORMAL LOW (ref 43.0–77.0)
Platelets: 279 K/uL (ref 150.0–400.0)
RBC: 4.53 Mil/uL (ref 4.22–5.81)
RDW: 12.8 % (ref 11.5–15.5)
WBC: 7 K/uL (ref 4.0–10.5)

## 2024-11-04 LAB — MICROALBUMIN / CREATININE URINE RATIO
Creatinine,U: 18.2 mg/dL
Microalb Creat Ratio: 119.9 mg/g — ABNORMAL HIGH (ref 0.0–30.0)
Microalb, Ur: 2.2 mg/dL — ABNORMAL HIGH (ref 0.0–1.9)

## 2024-11-04 LAB — TSH: TSH: 3.61 u[IU]/mL (ref 0.35–5.50)

## 2024-11-04 LAB — HEMOGLOBIN A1C: Hgb A1c MFr Bld: 7.3 % — ABNORMAL HIGH (ref 4.6–6.5)

## 2024-11-04 LAB — PSA: PSA: 0.79 ng/mL (ref 0.10–4.00)

## 2024-11-08 ENCOUNTER — Ambulatory Visit: Payer: Self-pay | Admitting: Internal Medicine

## 2024-11-10 ENCOUNTER — Ambulatory Visit: Admitting: Internal Medicine

## 2024-11-10 DIAGNOSIS — J3089 Other allergic rhinitis: Secondary | ICD-10-CM | POA: Diagnosis not present

## 2024-11-10 DIAGNOSIS — J3081 Allergic rhinitis due to animal (cat) (dog) hair and dander: Secondary | ICD-10-CM | POA: Diagnosis not present

## 2024-11-10 DIAGNOSIS — J301 Allergic rhinitis due to pollen: Secondary | ICD-10-CM | POA: Diagnosis not present

## 2024-11-11 ENCOUNTER — Ambulatory Visit: Admitting: Internal Medicine

## 2024-11-12 ENCOUNTER — Encounter: Payer: Self-pay | Admitting: Internal Medicine

## 2024-11-12 ENCOUNTER — Ambulatory Visit: Admitting: Internal Medicine

## 2024-11-12 VITALS — BP 114/68 | HR 113 | Ht 70.0 in | Wt 193.6 lb

## 2024-11-12 DIAGNOSIS — I152 Hypertension secondary to endocrine disorders: Secondary | ICD-10-CM | POA: Diagnosis not present

## 2024-11-12 DIAGNOSIS — R809 Proteinuria, unspecified: Secondary | ICD-10-CM | POA: Diagnosis not present

## 2024-11-12 DIAGNOSIS — R000 Tachycardia, unspecified: Secondary | ICD-10-CM | POA: Diagnosis not present

## 2024-11-12 DIAGNOSIS — Z7984 Long term (current) use of oral hypoglycemic drugs: Secondary | ICD-10-CM | POA: Diagnosis not present

## 2024-11-12 DIAGNOSIS — E1159 Type 2 diabetes mellitus with other circulatory complications: Secondary | ICD-10-CM | POA: Diagnosis not present

## 2024-11-12 DIAGNOSIS — E119 Type 2 diabetes mellitus without complications: Secondary | ICD-10-CM

## 2024-11-12 DIAGNOSIS — E785 Hyperlipidemia, unspecified: Secondary | ICD-10-CM | POA: Diagnosis not present

## 2024-11-12 DIAGNOSIS — E1129 Type 2 diabetes mellitus with other diabetic kidney complication: Secondary | ICD-10-CM

## 2024-11-12 NOTE — Assessment & Plan Note (Signed)
On Irbesartan 

## 2024-11-12 NOTE — Progress Notes (Signed)
 Subjective:  Patient ID: Brian Ayers, male    DOB: [birth year]  Age: 73 y.o. MRN: [id number]  CC: Medical Management of Chronic Issues (Follow up. Review lab results, abnormal levels. Ophthalmologist noted that left eye retina showed some atrophy. Suggestion for light therapy and wanted your opinion)   HPI [name] presents for   Outpatient Medications Prior to Visit  Medication Sig Dispense Refill   albuterol (VENTOLIN HFA) 108 (90 Base) MCG/ACT inhaler Inhale into the lungs every 6 (six) hours as needed for wheezing or shortness of breath.     aspirin 81 MG chewable tablet Chew 81 mg by mouth daily.     azelastine  (ASTELIN ) 0.1 % nasal spray USE 1 SPRAY INTO EACH NOSTRIL TWICE A DAY     azithromycin  (ZITHROMAX  Z-PAK) 250 MG tablet As directed 6 tablet 0   Biotin 2500 MCG CAPS Take 2,500 mcg by mouth daily.     Blood Glucose Monitoring Suppl (ONETOUCH VERIO FLEX SYSTEM) w/Device KIT USE AS DIRECTED TO CHECK BLOOD SUGARS 1 kit 0   clotrimazole -betamethasone  (LOTRISONE ) cream APPLY TO AFFECTED AREA TWICE A DAY 45 g 1   Continuous Blood Gluc Receiver (FREESTYLE LIBRE 3 READER) DEVI 1 Device by Does not apply route continuous. Use to monitor blood sugars 1 each 0   Continuous Glucose Sensor (FREESTYLE LIBRE 3 SENSOR) MISC PLACE 1 SENSOR ON THE SKIN EVERY 14 DAYS. USE TO CHECK GLUCOSE CONTINUOUSLY 6 each 3   diphenhydrAMINE  (BENADRYL  ALLERGY) 25 MG tablet Take 2 tablets (50 mg total) by mouth every 8 (eight) hours as needed for allergies (allergic reaction). 60 tablet 0   EPINEPHrine  0.3 mg/0.3 mL IJ SOAJ injection as needed.  0   famotidine  (PEPCID ) 20 MG tablet TAKE 1 TABLET BY MOUTH TWICE A DAY 180 tablet 3   FARXIGA  10 MG TABS tablet TAKE 1 TABLET BY MOUTH DAILY BEFORE BREAKFAST. 90 tablet 1   fexofenadine (ALLEGRA) 180 MG tablet Take 180 mg by mouth daily.     Fluocinolone Acetonide Body 0.01 % OIL SMARTSIG:sparingly Topical 3 Times Daily PRN     fluticasone  (FLONASE) 50  MCG/ACT nasal spray      fluticasone -salmeterol (ADVAIR HFA) 115-21 MCG/ACT inhaler Inhale 2 puffs into the lungs 2 (two) times daily.     glucose blood (ONETOUCH VERIO) test strip Use to check blood sugar 1x daily 100 strip 5   HYDROcodone  bit-homatropine (HYCODAN) 5-1.5 MG/5ML syrup Take 5 mLs by mouth every 8 (eight) hours as needed for cough. 240 mL 0   irbesartan (AVAPRO) 150 MG tablet TAKE 1 TABLET BY MOUTH EVERY DAY 90 tablet 3   metFORMIN  (GLUCOPHAGE -XR) 500 MG 24 hr tablet TAKE 2 TABLETS BY MOUTH TWICE A DAY 360 tablet 1   OneTouch Delica Lancets 30G MISC USE TO CHECK BLOOD SUGER TWICE A DAY 100 each 5   OVER THE COUNTER MEDICATION Saw Palmetto 450 mg, twice daily.     OVER THE COUNTER MEDICATION Vitamin D  3 One capsule daily.     pseudoephedrine  (SUDAFED) 30 MG tablet Take 2 tablets (60 mg total) by mouth every 4 (four) hours as needed for congestion (allergic reaction). (Patient not taking: Reported on 08/14/2024) 60 tablet 1   rosuvastatin  (CRESTOR ) 10 MG tablet TAKE 1 TABLET BY MOUTH EVERY DAY 90 tablet 3   RSV vaccine recomb adjuvanted (AREXVY ) 120 MCG/0.5ML injection Inject into the muscle. 0.5 mL 0   Saw Palmetto 450 MG CAPS Take 450 mg by mouth in the morning  and at bedtime.     sildenafil  (VIAGRA ) 100 MG tablet TAKE 1 TABLET BY MOUTH EVERY DAY AS NEEDED FOR ERECTILE DYSFUNCTION 30 tablet 4   tadalafil  (CIALIS ) 20 MG tablet Take 1 tablet (20 mg total) by mouth every three (3) days as needed for erectile dysfunction. 15 tablet 3   UNABLE TO FIND Med Name: Takes allergy shots weekly     No facility-administered medications prior to visit.    ROS: Review of Systems  Constitutional:  Negative for appetite change, fatigue and unexpected weight change.  HENT:  Negative for congestion, nosebleeds, sneezing, sore throat and trouble swallowing.   Eyes:  Negative for itching and visual disturbance.  Respiratory:  Negative for cough.   Cardiovascular:  Negative for chest pain,  palpitations and leg swelling.  Gastrointestinal:  Negative for abdominal distention, blood in stool, diarrhea and nausea.  Genitourinary:  Negative for frequency and hematuria.  Musculoskeletal:  Negative for back pain, gait problem, joint swelling and neck pain.  Skin:  Negative for rash.  Neurological:  Negative for dizziness, tremors, speech difficulty and weakness.  Psychiatric/Behavioral:  Negative for agitation, dysphoric mood and sleep disturbance. The patient is not nervous/anxious.     Objective:  BP 114/68   Pulse (!) 113   Ht 5' 10 (1.778 m)   Wt 193 lb 9.6 oz (87.8 kg)   SpO2 99%   BMI 27.78 kg/m   BP Readings from Last 3 Encounters:  11/12/24 114/68  08/14/24 122/74  07/17/24 (!) 160/83    Wt Readings from Last 3 Encounters:  11/12/24 193 lb 9.6 oz (87.8 kg)  07/17/24 196 lb (88.9 kg)  07/11/24 188 lb (85.3 kg)    Physical Exam Constitutional:      General: He is not in acute distress.    Appearance: He is well-developed.     Comments: NAD  Eyes:     Conjunctiva/sclera: Conjunctivae normal.     Pupils: Pupils are equal, round, and reactive to light.  Neck:     Thyroid : No thyromegaly.     Vascular: No JVD.  Cardiovascular:     Rate and Rhythm: Regular rhythm. Tachycardia present.     Heart sounds: Normal heart sounds. No murmur heard.    No friction rub. No gallop.  Pulmonary:     Effort: Pulmonary effort is normal. No respiratory distress.     Breath sounds: Normal breath sounds. No wheezing or rales.  Chest:     Chest wall: No tenderness.  Abdominal:     General: Bowel sounds are normal. There is no distension.     Palpations: Abdomen is soft. There is no mass.     Tenderness: There is no abdominal tenderness. There is no guarding or rebound.  Musculoskeletal:        General: No tenderness. Normal range of motion.     Cervical back: Normal range of motion.  Lymphadenopathy:     Cervical: No cervical adenopathy.  Skin:    General: Skin is  warm and dry.     Findings: No rash.  Neurological:     Mental Status: He is alert and oriented to person, place, and time.     Cranial Nerves: No cranial nerve deficit.     Motor: No abnormal muscle tone.     Coordination: Coordination normal.     Gait: Gait normal.     Deep Tendon Reflexes: Reflexes are normal and symmetric.  Psychiatric:        Behavior: Behavior  normal.        Thought Content: Thought content normal.        Judgment: Judgment normal.     Lab Results  Component Value Date   WBC 7.0 11/04/2024   HGB 14.1 11/04/2024   HCT 42.2 11/04/2024   PLT 279.0 11/04/2024   GLUCOSE 140 (H) 11/04/2024   CHOL 146 11/04/2024   TRIG 58.0 11/04/2024   HDL 62.10 11/04/2024   LDLCALC 72 11/04/2024   ALT 20 11/04/2024   AST 18 11/04/2024   NA 138 11/04/2024   K 4.8 11/04/2024   CL 103 11/04/2024   CREATININE 0.95 11/04/2024   BUN 23 11/04/2024   CO2 26 11/04/2024   TSH 3.61 11/04/2024   PSA 0.79 11/04/2024   HGBA1C 7.3 (H) 11/04/2024   MICROALBUR 2.2 (H) 11/04/2024    No results found.  Assessment & Plan:   Problem List Items Addressed This Visit     Diabetes type 2, controlled (HCC) - Primary   Dyslipidemia   Hypertension associated with diabetes (HCC)   On Irbesartan      Microalbuminuria due to type 2 diabetes mellitus (HCC)   Repeat urine test in 3 mo Avontae is on Farxiga  and Irbesartan already Consider Kerendia      Tachycardia   HR 110-120 bpm now Transient S tachy Check HR at home - call if elevated No SOB, CP etc      Type 2 diabetes mellitus without complication, without long-term current use of insulin (HCC)   Microalbuminuria: repeat urine test in 3 mo Mako is on Farxiga  and Irbesartan already Consider Leonore Using [place] 3      Relevant Orders   Comprehensive metabolic panel with GFR   Urinalysis   Microalbumin / creatinine urine ratio      No orders of the defined types were placed in this encounter.     Follow-up: Return  in about 3 months (around 02/12/2025) for a follow-up visit.  Marolyn Noel, MD

## 2024-11-12 NOTE — Assessment & Plan Note (Signed)
 Repeat urine test in 3 mo Mackinley is on Farxiga  and Irbesartan already Consider Kerendia

## 2024-11-12 NOTE — Assessment & Plan Note (Signed)
 Microalbuminuria: repeat urine test in 3 mo Brian Ayers is on Farxiga  and Irbesartan already Consider Kerendia Using [place] 3

## 2024-11-12 NOTE — Assessment & Plan Note (Signed)
 HR 110-120 bpm now Transient S tachy Check HR at home - call if elevated No SOB, CP etc

## 2024-11-17 DIAGNOSIS — J301 Allergic rhinitis due to pollen: Secondary | ICD-10-CM | POA: Diagnosis not present

## 2024-11-17 DIAGNOSIS — J3089 Other allergic rhinitis: Secondary | ICD-10-CM | POA: Diagnosis not present

## 2024-11-17 DIAGNOSIS — J3081 Allergic rhinitis due to animal (cat) (dog) hair and dander: Secondary | ICD-10-CM | POA: Diagnosis not present

## 2024-11-19 ENCOUNTER — Other Ambulatory Visit: Payer: Self-pay | Admitting: Internal Medicine

## 2024-11-19 ENCOUNTER — Ambulatory Visit: Admitting: Internal Medicine

## 2024-11-24 ENCOUNTER — Encounter: Payer: Self-pay | Admitting: Internal Medicine

## 2024-11-25 DIAGNOSIS — J3081 Allergic rhinitis due to animal (cat) (dog) hair and dander: Secondary | ICD-10-CM | POA: Diagnosis not present

## 2024-11-25 DIAGNOSIS — J301 Allergic rhinitis due to pollen: Secondary | ICD-10-CM | POA: Diagnosis not present

## 2024-11-25 DIAGNOSIS — J3089 Other allergic rhinitis: Secondary | ICD-10-CM | POA: Diagnosis not present

## 2024-12-01 DIAGNOSIS — J3081 Allergic rhinitis due to animal (cat) (dog) hair and dander: Secondary | ICD-10-CM | POA: Diagnosis not present

## 2024-12-01 DIAGNOSIS — J3089 Other allergic rhinitis: Secondary | ICD-10-CM | POA: Diagnosis not present

## 2024-12-01 DIAGNOSIS — J301 Allergic rhinitis due to pollen: Secondary | ICD-10-CM | POA: Diagnosis not present

## 2025-01-15 ENCOUNTER — Other Ambulatory Visit: Payer: Self-pay | Admitting: Internal Medicine

## 2025-01-15 MED ORDER — PROMETHAZINE-DM 6.25-15 MG/5ML PO SYRP: 5.0000 mL | ORAL_SOLUTION | Freq: Four times a day (QID) | ORAL | 0 refills | Status: AC | PRN

## 2025-01-15 MED ORDER — CEFDINIR 300 MG PO CAPS: 300.0000 mg | ORAL_CAPSULE | Freq: Two times a day (BID) | ORAL | 0 refills | Status: AC

## 2025-01-15 MED ORDER — IRBESARTAN 150 MG PO TABS: 150.0000 mg | ORAL_TABLET | Freq: Every day | ORAL | 3 refills | Status: AC

## 2025-01-15 NOTE — Progress Notes (Signed)
 Sick - URI cough Cefdinir  Cough syr

## 2025-02-12 ENCOUNTER — Ambulatory Visit: Admitting: Internal Medicine

## 2025-02-19 ENCOUNTER — Ambulatory Visit: Admitting: Internal Medicine

## 2025-07-17 ENCOUNTER — Ambulatory Visit
